# Patient Record
Sex: Female | Born: 1970 | Race: Black or African American | Hispanic: No | Marital: Married | Smoking: Never smoker
Health system: Southern US, Community
[De-identification: ages and names within clinical notes are randomized; demographics above are authoritative.]

## PROBLEM LIST (undated history)

## (undated) DIAGNOSIS — C50411 Malignant neoplasm of upper-outer quadrant of right female breast: Principal | ICD-10-CM

## (undated) DIAGNOSIS — R519 Headache, unspecified: Secondary | ICD-10-CM

## (undated) DIAGNOSIS — R51 Headache: Secondary | ICD-10-CM

## (undated) DIAGNOSIS — C50919 Malignant neoplasm of unspecified site of unspecified female breast: Secondary | ICD-10-CM

## (undated) HISTORY — PX: DILATION AND CURETTAGE OF UTERUS: SHX78

## (undated) HISTORY — DX: Malignant neoplasm of upper-outer quadrant of right female breast: C50.411

## (undated) HISTORY — DX: Malignant neoplasm of unspecified site of unspecified female breast: C50.919

---

## 2002-01-29 ENCOUNTER — Other Ambulatory Visit: Admission: RE | Admit: 2002-01-29 | Discharge: 2002-01-29 | Payer: Self-pay | Admitting: Internal Medicine

## 2002-07-01 ENCOUNTER — Emergency Department (HOSPITAL_COMMUNITY): Admission: EM | Admit: 2002-07-01 | Discharge: 2002-07-02 | Payer: Self-pay | Admitting: Emergency Medicine

## 2003-05-10 ENCOUNTER — Encounter (INDEPENDENT_AMBULATORY_CARE_PROVIDER_SITE_OTHER): Payer: Self-pay

## 2003-05-10 ENCOUNTER — Ambulatory Visit (HOSPITAL_COMMUNITY): Admission: RE | Admit: 2003-05-10 | Discharge: 2003-05-10 | Payer: Self-pay | Admitting: Obstetrics and Gynecology

## 2003-11-21 ENCOUNTER — Other Ambulatory Visit: Admission: RE | Admit: 2003-11-21 | Discharge: 2003-11-21 | Payer: Self-pay | Admitting: Obstetrics and Gynecology

## 2005-07-26 ENCOUNTER — Inpatient Hospital Stay (HOSPITAL_COMMUNITY): Admission: AD | Admit: 2005-07-26 | Discharge: 2005-07-26 | Payer: Self-pay | Admitting: Obstetrics and Gynecology

## 2005-08-31 ENCOUNTER — Encounter (INDEPENDENT_AMBULATORY_CARE_PROVIDER_SITE_OTHER): Payer: Self-pay | Admitting: Specialist

## 2005-08-31 ENCOUNTER — Inpatient Hospital Stay (HOSPITAL_COMMUNITY): Admission: AD | Admit: 2005-08-31 | Discharge: 2005-09-02 | Payer: Self-pay | Admitting: *Deleted

## 2007-11-15 ENCOUNTER — Inpatient Hospital Stay (HOSPITAL_COMMUNITY): Admission: RE | Admit: 2007-11-15 | Discharge: 2007-11-18 | Payer: Self-pay | Admitting: Obstetrics and Gynecology

## 2011-04-20 NOTE — Op Note (Signed)
Kiara Spencer, Kiara Spencer             ACCOUNT NO.:  [id number]   MEDICAL RECORD NO.:  [id number]          PATIENT TYPE:  INP   LOCATION:  9199                          [facility]ILITY:  [facility]   PHYSICIAN:  Lenoard Aden, M.D.DATE OF BIRTH:  October 14, 1971   DATE OF PROCEDURE:  11/15/2007  DATE OF DISCHARGE:                               OPERATIVE REPORT   PREOPERATIVE DIAGNOSIS:  40-week intrauterine pregnancy, previous  cesarean section, repeat.   POSTOPERATIVE DIAGNOSIS:  40-week intrauterine pregnancy, previous  cesarean section, repeat.   PROCEDURE:  Repeat low transverse cesarean section.   SURGEON:  Lenoard Aden, M.D.   ASSISTANT:  Marlinda Mike, C.N.M.   ANESTHESIA:  Spinal by Dr. Jean Rosenthal.   ESTIMATED BLOOD LOSS:  1000 mL.   COMPLICATIONS:  None.   DRAINS:  Foley.   COUNTS:  Correct.   DISPOSITION:  The patient to recovery room in good condition.   FINDINGS:  Full term living female in occipitoanterior position.  Apgars  8 and 9.  Placenta manually intact from a posterior location, three  vessel cord.  Normal tubes, normal ovaries.   DESCRIPTION OF PROCEDURE:  After being apprised of risks of anesthesia,  infection, bleeding, injury to abdominal organs with need for repair,  delayed versus immediate complications to include bowel and bladder  injury, the patient was brought to the operating room where she was  administered spinal anesthetic without complications and prepped and  draped in the usual sterile fashion.  Foley catheter placed.  After  achieving adequate anesthesia, dilute Marcaine solution placed.  A  Pfannenstiel skin incision was made with the scalpel and carried down to  the fascia which was nicked in the midline and opened transversely using  Mayo scissors.  The rectus muscle were separated sharply in the midline.  The peritoneum entered sharply and bladder blade placed.  The  visceroperitoneum was scored sharply off the lower uterine segment and  Kerr  hysterotomy incision made.  Bandage scissors used to extend  laterally.  Atraumatic delivery after amniotomy of clear fluid, full  term living female to the pediatricians.  Apgars 8 and 9.  Cord blood  collected.  Placenta delivered manually intact with three vessel cord.  Uterus curetted using a dry laparoscopy pack and closed in two running  imbricating layers of 0 Monocryl suture.  Good hemostasis  noted.  Irrigation accomplished.  Fascia was closed using a 0 Monocryl  in continuous running fashion.  The skin was closed using staples.  The  patient tolerated this procedure well and was transferred to the  recovery room in good condition.      Lenoard Aden, M.D.  Electronically Signed     RJT/MEDQ  D:  11/15/2007  T:  11/15/2007  Job:  [id number]

## 2011-04-23 NOTE — H&P (Signed)
   NAME:  Kiara Spencer, Kiara Spencer                       ACCOUNT NO.:  [id number]   MEDICAL RECORD NO.:  [id number]                   PATIENT TYPE:  AMB   LOCATION:  SDC                                  [facility]   PHYSICIAN:  Lenoard Aden, M.D.             DATE OF BIRTH:  01/18/1971   DATE OF ADMISSION:  05/10/2003  DATE OF DISCHARGE:                                HISTORY & PHYSICAL   CHIEF COMPLAINT:  Desire for elective termination.   HISTORY OF PRESENT ILLNESS:  The patient is a 40 year old African American  female, G4, P0, at 9-10 weeks intrauterine gestation with a history of SAB  x3, who presents for elective termination of pregnancy.   MEDICATIONS:  Excedrin.   ALLERGIES:  No known drug allergies.   FAMILY HISTORY:  Hypertension.   SOCIAL HISTORY:  Nonsmoker, resides with mom.   PHYSICAL EXAMINATION:  GENERAL:  She is a well-developed, well-nourished  [place] female in no acute distress.  HEENT:  Normal.  LUNGS:  Clear.  HEART:  Regular rhythm.  ABDOMEN:  Sharply retroflexed, enlarged 8-10 weeks size with small posterior  fundal fibroid noted on ultrasound of 4 x 4 cm.  EXTREMITIES:  No cyanosis, clubbing, or edema.  NEUROLOGICAL:  Nonfocal.   IMPRESSION:  A 9-10 week intrauterine pregnancy with desire for termination.   PLAN:  Proceed with suction D&E.  Risks of anesthesia, infection, bleeding,  injury to abdominal organs, need for pressors, complications including bowel  and bladder rupture noted.  The patient acknowledges and wishes to proceed.                                               Lenoard Aden, M.D.    RJT/MEDQ  D:  05/08/2003  T:  05/08/2003  Job:  [id number]

## 2011-04-23 NOTE — Discharge Summary (Signed)
Kiara Spencer, Kiara Spencer             ACCOUNT NO.:  [id number]   MEDICAL RECORD NO.:  [id number]          PATIENT TYPE:  INP   LOCATION:  9108                          [facility]   PHYSICIAN:  Lenoard Aden, M.D.DATE OF BIRTH:  May 16, 1971   DATE OF ADMISSION:  11/15/2007  DATE OF DISCHARGE:  11/18/2007                               DISCHARGE SUMMARY   HISTORY OF PRESENT ILLNESS:  The patient underwent uncomplicated repeat  Cesarean section November 17, 2007. Postoperative course uncomplicated.  Tolerated a regular diet well.   DISPOSITION:  Discharged to home on day 3. Discharge teaching done.   DISCHARGE MEDICATIONS:  1. Tylox.  2. Prenatal vitamins.  3. Iron.   FOLLOWUP:  In the office in 4 to 6 weeks.      Lenoard Aden, M.D.  Electronically Signed     RJT/MEDQ  D:  01/02/2008  T:  01/02/2008  Job:  [id number]

## 2011-04-23 NOTE — Op Note (Signed)
[name], Kiara Spencer             ACCOUNT NO.:  [id number]   MEDICAL RECORD NO.:  [id number]          PATIENT TYPE:  INP   LOCATION:  9103                          [facility]   PHYSICIAN:  Lenoard Aden, M.D.DATE OF BIRTH:  July 29, 1971   DATE OF PROCEDURE:  08/31/2005  DATE OF DISCHARGE:                                 OPERATIVE REPORT   PREOPERATIVE DIAGNOSES:  1.  Active phase arrest.  2.  Nonreassuring fetal heart rate.   POSTOPERATIVE DIAGNOSES:  1.  Active phase arrest.  2.  Nonreassuring fetal heart rate.   OPERATION/PROCEDURE:  Primary low segment transverse cesarean section.   SURGEON:  Lenoard Aden, M.D.   ASSISTANT:  Richardean Sale, M.D.   ANESTHESIA:  Epidural by Burnett Corrente, M.D.   ESTIMATED BLOOD LOSS:  1000 mL.   COMPLICATIONS:  None.   DRAINS:  Foley catheter.   COUNTS:  Correct.   DISPOSITION:  The patient to recovery in good condition.   FINDINGS:  Full-term living female, occiput transverse,  Apgars 8 and 9.  Cord blood collected.  Pediatricians in attendance.  Normal tubes.  Normal  ovaries.   DESCRIPTION OF PROCEDURE:  After being apprised of the risks of anesthesia,  infection, bleeding, injury to abdominal organs and need for repair, delayed  versus immediate complications to include bowel and bladder injury noted.  The patient brought to the operating room.  She was administered a dosing of  her epidural anesthetic without complications, prepped and draped in the  usual sterile fashion.  Foley catheter previously placed.  After achieving  adequate anesthesia, dilute Marcaine solution placed in the area.  Pfannenstiel skin incision made with the scalpel, carried down to the fascia  which was nicked in the midline and opened transversely using Mayo scissors.  Rectus muscles were dissected sharply in the midline.  Peritoneum entered  sharply and bladder blade placed.  Visceral peritoneum was scored sharply  off the lower uterine  segment.  Curved hysterotomy incision made.  Atraumatic delivery of a full-term living female, handed to the  pediatricians in attendance.  Apgars 8 and 9, cord blood collected.  Placenta delivered manually intact.  Three-vessel cord noted.  Uterus  curetted using dry lap pads and closed in two layers using 0 Monocryl  suture.  Good hemostasis noted.  Right lateral margin bleeding was secured  with an interrupted figure-of-eight suture.  Bladder flap was inspected and  found to be hemostatic. At this time irrigation was accomplished. The fascia  was then closed using 0 Monocryl on tension.  Skin closed using staples.  Ovaries and tubes were previously palpably normal.  The patient tolerated  the procedure well and was transferred to the recovery room in good  condition.      Lenoard Aden, M.D.  Electronically Signed     RJT/MEDQ  D:  08/31/2005  T:  09/01/2005  Job:  [id number]

## 2011-04-23 NOTE — Discharge Summary (Signed)
NAMEJATZIRY, WECHTER             ACCOUNT NO.:  [id number]   MEDICAL RECORD NO.:  [id number]          PATIENT TYPE:  INP   LOCATION:  9103                          [facility]   PHYSICIAN:  Lenoard Aden, M.D.DATE OF BIRTH:  Jun 16, 1971   DATE OF ADMISSION:  08/31/2005  DATE OF DISCHARGE:  09/02/2005                                 DISCHARGE SUMMARY   The patient underwent uncomplicated primary cesarean section for active  phase arrest on August 31, 2005. Postoperative course was uncomplicated.  Discharged to home on day #3. Tylox and prenatal vitamins.  Iron given.  Discharge teaching done.  The patient to follow up in the office in four  weeks.      Lenoard Aden, M.D.  Electronically Signed     RJT/MEDQ  D:  10/03/2005  T:  10/04/2005  Job:  [id number]

## 2011-04-23 NOTE — Op Note (Signed)
   NAME:  Kiara Spencer, Kiara Spencer                       ACCOUNT NO.:  [id number]   MEDICAL RECORD NO.:  [id number]                   PATIENT TYPE:  AMB   LOCATION:  SDC                                  [facility]   PHYSICIAN:  Lenoard Aden, M.D.             DATE OF BIRTH:  08-20-1971   DATE OF PROCEDURE:  05/10/2003  DATE OF DISCHARGE:                                 OPERATIVE REPORT   PREOPERATIVE DIAGNOSES:  1. Ten-week intrauterine pregnancy.  2. Elective termination of pregnancy.   POSTOPERATIVE DIAGNOSES:  1. Ten-week intrauterine pregnancy.  2. Elective termination of pregnancy.   PROCEDURE:  Suction dilatation and evacuation.   SURGEON:  Lenoard Aden, M.D.   ANESTHESIA:  MAC with paracervical.   ESTIMATED BLOOD LOSS:  Less than 100 mL.   COMPLICATIONS:  None.   DRAINS:  None.   COUNTS:  Correct.   Patient went to the recovery room in good condition.  Products of conception  to pathology.   BRIEF OPERATIVE NOTE:  After being apprised of the risks of anesthesia,  infection, bleeding, uterine perforation and possible need for repair, the  patient was brought to the operating room, where she was administered IV  sedation without difficulty, prepped and draped in the usual sterile  fashion, catheterized until the bladder is empty.  After achieving adequate  anesthesia, a dilute paracervical block is placed using a 1% Xylocaine  solution.  Uterus sounds in a retroflexed fashion and deviated to the left,  sounds to 12 cm, dilated easily up to a #27 Pratt dilator.  A 9 mm curved  suction curette placed, products of conception noted upon initiating  suction.  Repeat suction and curettage in four-quadrant method reveals the  cavity to be empty.  Good hemostasis was noted.  All instruments were  removed from the vagina.  The patient was transferred to recovery in good  condition.                                                Lenoard Aden, M.D.    RJT/MEDQ  D:  05/10/2003  T:  05/10/2003  Job:  [id number]

## 2011-04-23 NOTE — H&P (Signed)
NAMEMARYRUTH, APPLE             ACCOUNT NO.:  [id number]   MEDICAL RECORD NO.:  [id number]          PATIENT TYPE:  INP   LOCATION:  9166                          [facility]   PHYSICIAN:  Lenoard Aden, M.D.DATE OF BIRTH:  29-Jun-1971   DATE OF ADMISSION:  08/31/2005  DATE OF DISCHARGE:                                HISTORY & PHYSICAL   CHIEF COMPLAINT:  Spontaneous rupture of membranes at 1:45 a.m.   HISTORY OF PRESENT ILLNESS:  She is a 40 year old G5, P0, EDD of September 07, 2005, at 1 weeks' gestation, who presents with spontaneous rupture of  membranes and clear fluid.   ALLERGIES:  She has no known drug allergies.   MEDICATIONS:  Prenatal vitamins.   OBSTETRIC HISTORY:  Remarkable for TAB x4 with the most recent being in  2005. She has a questionable history of Chlamydia in the 1990s, gonorrhea in  1989.   SOCIAL HISTORY:  She is a nonsmoker, nondrinker. She denies domestic or  physical violence.   FAMILY HISTORY:  She has a family history of hypertension, hypothyroidism,  breast cancer and alcohol abuse.   PAST MEDICAL HISTORY:  She has a personal history of migraine headaches.   PRENATAL LABORATORY DATA:  Blood type B positive, Rh antibody negative.  Hemoglobin electrophoresis within normal limits. Rubella immune. VDRL  nonreactive. Hepatitis and HIV nonreactive, and GBS as reported is positive.  The pregnancy course has been otherwise uncomplicated.   PHYSICAL EXAMINATION:  GENERAL:  She is a well-developed, well-nourished  [place] female in no acute distress.  HEENT:  Normal.  LUNGS:  Clear.  HEART:  Regular rate and rhythm.  ABDOMEN:  Soft, gravid and nontender. Estimated fetal weight about 7 pounds.  GENITALIA:  The cervix is 4 cm, 90%, vertex -1.  EXTREMITIES:  Reveal no cords.  NEUROLOGIC EXAM:  Nonfocal.   IMPRESSION:  1.  A 39-week intrauterine pregnancy.  2.  Spontaneous rupture of membranes at term.  3.  Group B Streptococcus  positive.   PLAN:  Proceed with Pitocin augmentation. Anticipate attempts at vaginal  delivery.      Lenoard Aden, M.D.  Electronically Signed     RJT/MEDQ  D:  08/31/2005  T:  08/31/2005  Job:  [id number]   cc:   [facility]

## 2011-05-18 ENCOUNTER — Other Ambulatory Visit: Payer: Self-pay | Admitting: Obstetrics and Gynecology

## 2011-09-13 LAB — RPR: RPR Ser Ql: NONREACTIVE

## 2011-09-13 LAB — CBC
Hemoglobin: 12.4
MCHC: 34.1
MCHC: 34.2
RBC: 3.35 — ABNORMAL LOW
RDW: 13.8
RDW: 13.8
WBC: 10
WBC: 7

## 2015-02-11 ENCOUNTER — Other Ambulatory Visit (HOSPITAL_COMMUNITY)
Admission: RE | Admit: 2015-02-11 | Discharge: 2015-02-11 | Disposition: A | Discharge status: Home / Self Care | Payer: BLUE CROSS/BLUE SHIELD | Source: Ambulatory Visit | Attending: Family Medicine | Admitting: Family Medicine

## 2015-02-11 ENCOUNTER — Other Ambulatory Visit: Payer: Self-pay | Admitting: Physician Assistant

## 2015-02-11 DIAGNOSIS — Z124 Encounter for screening for malignant neoplasm of cervix: Secondary | ICD-10-CM | POA: Insufficient documentation

## 2015-02-13 LAB — CYTOLOGY - PAP

## 2015-11-13 ENCOUNTER — Other Ambulatory Visit: Payer: Self-pay | Admitting: Obstetrics and Gynecology

## 2015-11-13 DIAGNOSIS — N63 Unspecified lump in unspecified breast: Secondary | ICD-10-CM

## 2015-11-13 DIAGNOSIS — N644 Mastodynia: Secondary | ICD-10-CM

## 2015-11-24 ENCOUNTER — Ambulatory Visit
Admission: RE | Admit: 2015-11-24 | Discharge: 2015-11-24 | Disposition: A | Discharge status: Home / Self Care | Payer: BLUE CROSS/BLUE SHIELD | Source: Ambulatory Visit | Attending: Obstetrics and Gynecology | Admitting: Obstetrics and Gynecology

## 2015-11-24 ENCOUNTER — Other Ambulatory Visit: Payer: Self-pay | Admitting: Obstetrics and Gynecology

## 2015-11-24 DIAGNOSIS — N644 Mastodynia: Secondary | ICD-10-CM

## 2015-11-24 DIAGNOSIS — N63 Unspecified lump in unspecified breast: Secondary | ICD-10-CM

## 2015-11-27 ENCOUNTER — Other Ambulatory Visit: Payer: Self-pay | Admitting: Obstetrics and Gynecology

## 2015-11-27 ENCOUNTER — Ambulatory Visit
Admission: RE | Admit: 2015-11-27 | Discharge: 2015-11-27 | Disposition: A | Discharge status: Home / Self Care | Payer: BLUE CROSS/BLUE SHIELD | Source: Ambulatory Visit | Attending: Obstetrics and Gynecology | Admitting: Obstetrics and Gynecology

## 2015-11-27 DIAGNOSIS — N63 Unspecified lump in unspecified breast: Secondary | ICD-10-CM

## 2015-11-27 DIAGNOSIS — N644 Mastodynia: Secondary | ICD-10-CM

## 2015-12-02 ENCOUNTER — Encounter: Payer: Self-pay | Admitting: *Deleted

## 2015-12-02 ENCOUNTER — Telehealth: Payer: Self-pay | Admitting: *Deleted

## 2015-12-02 DIAGNOSIS — C50411 Malignant neoplasm of upper-outer quadrant of right female breast: Secondary | ICD-10-CM | POA: Insufficient documentation

## 2015-12-02 HISTORY — DX: Malignant neoplasm of upper-outer quadrant of right female breast: C50.411

## 2015-12-02 NOTE — Telephone Encounter (Signed)
Confirmed [facility] for 12/10/15 at 0830 .  Instructions and contact information given.

## 2015-12-03 ENCOUNTER — Telehealth: Payer: Self-pay | Admitting: *Deleted

## 2015-12-03 NOTE — Telephone Encounter (Signed)
Mailed clinic packet to pt.  

## 2015-12-10 ENCOUNTER — Ambulatory Visit (HOSPITAL_BASED_OUTPATIENT_CLINIC_OR_DEPARTMENT_OTHER): Payer: BLUE CROSS/BLUE SHIELD | Admitting: Hematology and Oncology

## 2015-12-10 ENCOUNTER — Encounter: Payer: Self-pay | Admitting: Hematology and Oncology

## 2015-12-10 ENCOUNTER — Ambulatory Visit: Payer: BLUE CROSS/BLUE SHIELD | Attending: General Surgery | Admitting: Physical Therapy

## 2015-12-10 ENCOUNTER — Encounter: Payer: Self-pay | Admitting: Physical Therapy

## 2015-12-10 ENCOUNTER — Encounter: Payer: Self-pay | Admitting: Skilled Nursing Facility1

## 2015-12-10 ENCOUNTER — Ambulatory Visit
Admission: RE | Admit: 2015-12-10 | Discharge: 2015-12-10 | Disposition: A | Discharge status: Home / Self Care | Payer: BLUE CROSS/BLUE SHIELD | Source: Ambulatory Visit | Attending: Radiation Oncology | Admitting: Radiation Oncology

## 2015-12-10 ENCOUNTER — Other Ambulatory Visit (HOSPITAL_BASED_OUTPATIENT_CLINIC_OR_DEPARTMENT_OTHER): Payer: BLUE CROSS/BLUE SHIELD

## 2015-12-10 ENCOUNTER — Encounter: Payer: Self-pay | Admitting: Nurse Practitioner

## 2015-12-10 ENCOUNTER — Encounter: Payer: Self-pay | Admitting: *Deleted

## 2015-12-10 VITALS — BP 122/69 | HR 82 | Temp 98.6°F | Resp 18 | Ht 61.5 in | Wt 152.3 lb

## 2015-12-10 DIAGNOSIS — C50411 Malignant neoplasm of upper-outer quadrant of right female breast: Secondary | ICD-10-CM | POA: Insufficient documentation

## 2015-12-10 LAB — COMPREHENSIVE METABOLIC PANEL
ALBUMIN: 4.1 g/dL (ref 3.5–5.0)
ALT: 11 U/L (ref 0–55)
AST: 18 U/L (ref 5–34)
Alkaline Phosphatase: 58 U/L (ref 40–150)
Anion Gap: 8 mEq/L (ref 3–11)
BILIRUBIN TOTAL: 0.45 mg/dL (ref 0.20–1.20)
BUN: 19.2 mg/dL (ref 7.0–26.0)
CALCIUM: 9.5 mg/dL (ref 8.4–10.4)
CO2: 27 mEq/L (ref 22–29)
Chloride: 106 mEq/L (ref 98–109)
Creatinine: 0.9 mg/dL (ref 0.6–1.1)
EGFR: 85 mL/min/{1.73_m2} — AB (ref >90)
GLUCOSE: 122 mg/dL (ref 70–140)
Potassium: 3.7 mEq/L (ref 3.5–5.1)
SODIUM: 142 meq/L (ref 136–145)
TOTAL PROTEIN: 7.5 g/dL (ref 6.4–8.3)

## 2015-12-10 LAB — CBC WITH DIFFERENTIAL/PLATELET
BASO%: 0.7 % (ref 0.0–2.0)
Basophils Absolute: 0 10*3/uL (ref 0.0–0.1)
EOS ABS: 0.1 10*3/uL (ref 0.0–0.5)
EOS%: 1.6 % (ref 0.0–7.0)
HEMATOCRIT: 40.1 % (ref 34.8–46.6)
HEMOGLOBIN: 13 g/dL (ref 11.6–15.9)
LYMPH%: 34.1 % (ref 14.0–49.7)
MCH: 28.8 pg (ref 25.1–34.0)
MCHC: 32.5 g/dL (ref 31.5–36.0)
MCV: 88.8 fL (ref 79.5–101.0)
MONO#: 0.2 10*3/uL (ref 0.1–0.9)
MONO%: 5.4 % (ref 0.0–14.0)
NEUT%: 58.2 % (ref 38.4–76.8)
NEUTROS ABS: 2.4 10*3/uL (ref 1.5–6.5)
PLATELETS: 257 10*3/uL (ref 145–400)
RBC: 4.52 10*6/uL (ref 3.70–5.45)
RDW: 13 % (ref 11.2–14.5)
WBC: 4.2 10*3/uL (ref 3.9–10.3)
lymph#: 1.4 10*3/uL (ref 0.9–3.3)

## 2015-12-10 NOTE — Assessment & Plan Note (Signed)
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 2 suspicious groups of calcifications right breast UOQ posteriorly 2.2 x 2.3 x 2.5 cm (by U/S measured 1.2 x 0.8 x 1.2 cm); anteriorly 1.1 x 1.2 x 0.8 cm (not seen by ultrasound)  Pathology and radiology counseling: Discussed with the patient, the details of pathology including the type of breast cancer,the clinical staging, the significance of ER, PR and HER-2/neu receptors and the implications for treatment. After reviewing the pathology in detail, we proceeded to discuss the different treatment options between surgery, radiation, chemotherapy, antiestrogen therapies.  Recommendation: 1. Genetic counseling 2. Breast MRI 3. Breast conserving surgery versus mastectomy based upon breast MRI results 4. Oncotype DX and the final pathology specimen to determine if she would benefit from chemotherapy 5. Followed by adjuvant radiation therapy if she has breast conserving surgery 6. Followed by adjuvant antiestrogen therapy with tamoxifen 5-10 years  Oncotype counseling: I discussed Oncotype DX test. I explained to the patient that this is a 21 gene panel to evaluate patient tumors DNA to calculate recurrence score. This would help determine whether patient has high risk or intermediate risk or low risk breast cancer. She understands that if her tumor was found to be high risk, she would benefit from systemic chemotherapy. If low risk, no need of chemotherapy. If she was found to be intermediate risk, we would need to evaluate the score as well as other risk factors and determine if an abbreviated chemotherapy may be of benefit.  Return to clinic after surgery to discuss final pathology report and to determine if Oncotype DX should be sent

## 2015-12-10 NOTE — Progress Notes (Signed)
Clinical Social Work [place] Psychosocial Distress Screening [place]  Patient completed distress screening protocol and scored a 5 on the Psychosocial Distress Thermometer which indicates moderate distress. Clinical Social Worker met with patient and patients husband in [facility] to assess for distress and other psychosocial needs. Patient stated she was feeling overwhelmed but felt "better" after meeting with the treatment team and getting more information on her treatment plan. CSW and patient discussed common feeling and emotions when being diagnosed with cancer, and the importance of support during treatment. CSW informed patient of the support team and support services at [facility], and patient was agreeable to an alight guide. CSW provided contact information and encouraged patient to call with any questions or concerns.   ONCBCN DISTRESS SCREENING 12/10/2015  Screening Type Initial Screening  Distress experienced in past week (1-10) 5  Practical problem type Childcare  Emotional problem type Nervousness/Anxiety;Adjusting to illness  Information Concerns Type Lack of info about diagnosis;Lack of info about treatment  Physical Problem type Pain;Sleep/insomnia  Physician notified of physical symptoms Yes  Referral to clinical psychology No  Referral to clinical social work Yes  Referral to dietition No  Referral to financial advocate No  Referral to support programs Yes  Referral to palliative care No   Johnnye Lana, MSW, LCSW, OSW-C Clinical Social Worker [place] [phone]

## 2015-12-10 NOTE — Patient Instructions (Signed)

## 2015-12-10 NOTE — Progress Notes (Signed)
Radiation Oncology         [phone] ________________________________  Name: Kiara Spencer MRN: [id number]  Date: 12/10/2015  DOB: [birth year]  DE:YCXKGY,JEHUDJS J, MD  [name], MD     REFERRING PHYSICIAN: [name], MD   DIAGNOSIS: The encounter diagnosis was Breast cancer of upper-outer quadrant of right female breast ([place]).   HISTORY OF PRESENT ILLNESS::Kiara Spencer is a 45 y.o. female who is seen for an initial consultation visit. She presented with right axillary tail breast pain. Mammogram on 11/24/15 showed calcifications in the upper outer quadrant of the right breast, posterior depth, measuring 2.2 x 2.3 x 2.5 cm. A second group of calcifications were seen in the upper outer quadrant of the right breast, middle to posterior depth, measuring 1.1 x 1.2 x 0.8 cm. Ultrasound on 11/24/15 showed an ill-defined hypoechoic area containing hyperechoic foci consistent with microcalcifications measuring 1.2 x 0.8 x 1.2 cm in the right breast likely corresponding to the larger and more suspicious microcalcifications seen on mammography. No sonographic correlation was seen to the smaller more anteriorly located group of calcifications. This was BI-RADS CATEGORY 5: Highly suggestive of malignancy. 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 Excision of the sclerosing lesion was recommended and so was an MRI of the bilateral breasts prior to excision given the multifocal abnormalities to evaluate the extent of the disease. The anterior biopsy clip migrated 2 cm away from the sample site. The patient presents today in [facility].   PREVIOUS RADIATION THERAPY:  No   PAST MEDICAL HISTORY:  has a past medical history of Breast cancer of upper-outer quadrant of right female breast ([place]) (12/02/2015) and Breast cancer ([place]).     PAST SURGICAL HISTORY: Past Surgical History  Procedure Laterality Date  . Cesarean section  x 2     FAMILY HISTORY: family history includes Breast cancer in her maternal aunt and mother; Leukemia in her maternal uncle.   SOCIAL HISTORY:  reports that she has never smoked. She does not have any smokeless tobacco history on file. She reports that she does not drink alcohol or use illicit drugs.   ALLERGIES: Review of patient's allergies indicates not on file.   MEDICATIONS:  Current Outpatient Prescriptions  Medication Sig Dispense Refill  . aspirin-acetaminophen-caffeine (EXCEDRIN MIGRAINE) 250-250-65 MG tablet Take by mouth every 6 (six) hours as needed for headache.     No current facility-administered medications for this encounter.     REVIEW OF SYSTEMS:  A 15 point review of systems is documented in the electronic medical record. This was obtained by the nursing staff. However, I reviewed this with the patient to discuss relevant findings and make appropriate changes.  Pertinent items noted in HPI and remainder of comprehensive ROS otherwise negative.  Gynecologic History  Age at first menstrual period? 14  Are you still having periods? No  If you no longer have periods: Have you used hormone replacement? No Obstetric History:  How many children have you carried to term? 2 Your age at first live birth? 22  Pregnant now or trying to get pregnant? No  Have you used birth control pills or hormone shots for contraception? Yes  If so, for how long (or  approximate dates)? 17 years  Would you be interested in learning more about the options to preserve fertility? No Health Maintenance:  Have you ever had a colonoscopy? No  Have you ever had a bone density? No  Date of your last PAP smear? 2013 Date of your  FIRST mammogram? 2015  She has right axillary tail breast pain.   PHYSICAL EXAM: She had a palpable mass in the uoq of the right breast superiorly. No palpable axillary lymphadenopathy. Negative breast exam of the left. General: Well-developed, in no acute distress HEENT: Normocephalic, atraumatic; oral cavity clear Neck: Supple without any lymphadenopathy Cardiovascular: Regular rate and rhythm Respiratory: Clear to auscultation bilaterally GI: Soft, nontender, normal bowel sounds Extremities: No edema present Neuro: No focal deficits Vitals with BMI 12/10/2015  Height 5' 1.5"  Weight 152 lbs 5 oz  BMI 17.4  Systolic 944  Diastolic 69  Pulse 82  Respirations 18   ECOG = 1  0 - Asymptomatic (Fully active, able to carry on all predisease activities without restriction)  1 - Symptomatic but completely ambulatory (Restricted in physically strenuous activity but ambulatory and able to carry out work of a light or sedentary nature. For example, light housework, office work)  2 - Symptomatic, <50% in bed during the day (Ambulatory and capable of all self care but unable to carry out any work activities. Up and about more than 50% of waking hours)  3 - Symptomatic, >50% in bed, but not bedbound (Capable of only limited self-care, confined to bed or chair 50% or more of waking hours)  4 - Bedbound (Completely disabled. Cannot carry on any self-care. Totally confined to bed or chair)  5 - Death   Eustace Pen MM, Creech RH, Tormey DC, et al. [phone]). "Toxicity and response criteria of the [facility]". [place] Oncol. 5 (6): 649-55   LABORATORY DATA:  Lab Results  Component Value Date   WBC 4.2 12/10/2015   HGB 13.0 12/10/2015   HCT 40.1 12/10/2015   MCV 88.8 12/10/2015   PLT 257 12/10/2015   Lab Results  Component Value Date   NA 142 12/10/2015   K 3.7 12/10/2015   CO2 27 12/10/2015   Lab Results  Component Value Date   ALT 11 12/10/2015   AST 18  12/10/2015   ALKPHOS 58 12/10/2015   BILITOT 0.45 12/10/2015      RADIOGRAPHY: Mm Digital Diagnostic Unilat R  11/27/2015  CLINICAL DATA:  Post biopsy mammogram of the right breast for clip placement. EXAM: DIAGNOSTIC RIGHT MAMMOGRAM POST ULTRASOUND BIOPSY COMPARISON:  Previous exam(s). FINDINGS: Mammographic images were obtained following stereotactic guided biopsy of 2 sites of calcifications in the right breast. The coil shaped biopsy marking clip is appropriately positioned at the first site of biopsy in the upper-outer quadrant of the right breast labeled "posterior" for pathology. The X shaped biopsy marking clip at the second biopsied site in the upper-outer quadrant of the right breast labeled "anterior" for pathology is approximately 2 cm medially displaced from the sampled site. There are a few remaining calcifications at this site if localization for excision is necessary. IMPRESSION: 1. The coil shaped biopsy marking clip is appropriately positioned at the first site of biopsy in the upper-outer quadrant of the right breast, labeled "posterior" for pathology. 2. The X shaped biopsy marking clip is approximately 2 cm a medially displaced from the sampled site in the upper-outer quadrant of the right breast labeled "anterior" for pathology. There are residual  calcifications should localization for excision be necessary. Final Assessment: Post Procedure Mammograms for Marker Placement Electronically Signed   By: Ammie Ferrier M.D.   On: 11/27/2015 11:22   US Breast Ltd Uni Right Inc Axilla  11/24/2015  CLINICAL DATA:  Right axillary tail breast pain. EXAM: DIGITAL DIAGNOSTIC BILATERAL MAMMOGRAM WITH 3D TOMOSYNTHESIS WITH CAD ULTRASOUND RIGHT BREAST COMPARISON:  Previous exam(s). ACR Breast Density Category c: The breast tissue is heterogeneously dense, which may obscure small masses. FINDINGS: Mammographically, there is a highly suspicious group of calcifications in the right breast upper  outer quadrant, far posterior depth, measuring 2.2 x 2.3 x 2.5 cm. There is an associated focal asymmetry. A second group of indeterminate calcifications is seen in the right breast upper outer quadrant, middle to posterior depth, measuring 1.1 x 1.2 x 0.8 cm. Mammographic images were processed with CAD. On physical exam, there is a firm palpable mass in the right breast upper outer quadrant, posterior depth. Targeted ultrasound is performed, showing an ill-defined hypoechoic area containing hyperechoic foci consistent with interspersed microcalcifications which measures 1.2 x 0.8 x 1.2 cm in the right breast 10 o'clock 10 cm from the nipple. This area likely corresponds to the larger and more suspicious group of microcalcifications seen mammographically. No sonographic correlation is seen to the smaller more anteriorly located group of calcifications in the upper-outer quadrant of the right breast. IMPRESSION: Two suspicious groups of microcalcifications in the right breast upper outer quadrant, for which stereotactic core needle biopsy is recommended. If stereotactic biopsy of the more posterior group is not technically feasible, ultrasound-guided core needle biopsy may be performed instead. RECOMMENDATION: Stereotactic biopsy of the right breast. I have discussed the findings and recommendations with the patient. Results were also provided in writing at the conclusion of the visit. If applicable, a reminder letter will be sent to the patient regarding the next appointment. BI-RADS CATEGORY  5: Highly suggestive of malignancy. Electronically Signed   By: Fidela Salisbury M.D.   On: 11/24/2015 17:32   Mm Diag Breast Tomo Bilateral  11/24/2015  CLINICAL DATA:  Right axillary tail breast pain. EXAM: DIGITAL DIAGNOSTIC BILATERAL MAMMOGRAM WITH 3D TOMOSYNTHESIS WITH CAD ULTRASOUND RIGHT BREAST COMPARISON:  Previous exam(s). ACR Breast Density Category c: The breast tissue is heterogeneously dense, which may  obscure small masses. FINDINGS: Mammographically, there is a highly suspicious group of calcifications in the right breast upper outer quadrant, far posterior depth, measuring 2.2 x 2.3 x 2.5 cm. There is an associated focal asymmetry. A second group of indeterminate calcifications is seen in the right breast upper outer quadrant, middle to posterior depth, measuring 1.1 x 1.2 x 0.8 cm. Mammographic images were processed with CAD. On physical exam, there is a firm palpable mass in the right breast upper outer quadrant, posterior depth. Targeted ultrasound is performed, showing an ill-defined hypoechoic area containing hyperechoic foci consistent with interspersed microcalcifications which measures 1.2 x 0.8 x 1.2 cm in the right breast 10 o'clock 10 cm from the nipple. This area likely corresponds to the larger and more suspicious group of microcalcifications seen mammographically. No sonographic correlation is seen to the smaller more anteriorly located group of calcifications in the upper-outer quadrant of the right breast. IMPRESSION: Two suspicious groups of microcalcifications in the right breast upper outer quadrant, for which stereotactic core needle biopsy is recommended. If stereotactic biopsy of the more posterior group is not technically feasible, ultrasound-guided core needle biopsy may be performed instead. RECOMMENDATION: Stereotactic biopsy of the right breast.  I have discussed the findings and recommendations with the patient. Results were also provided in writing at the conclusion of the visit. If applicable, a reminder letter will be sent to the patient regarding the next appointment. BI-RADS CATEGORY  5: Highly suggestive of malignancy. Electronically Signed   By: Fidela Salisbury M.D.   On: 11/24/2015 17:32   Mm Rt Breast Bx W Loc Dev 1st Lesion Image Bx Spec Stereo Guide  11/28/2015  ADDENDUM REPORT: 11/28/2015 12:40 ADDENDUM: Pathology revealed grade II invasive ductal carcinoma and  ductal carcinoma in situ with calcifications and lymphovascular invasion in the posterior right breast and a complex sclerosing lesion with calcifications in the anterior right breast (excision is recommended). This was found to be concordant by Dr. Ammie Ferrier. MRI should be considered prior to excision given the multifocal abnormalities to evaluate the extent of disease. Pathology was discussed with the patient by telephone. She reported doing well after the biopsies with tenderness at the sites. The patient has been scheduled at [facility] on December 10, 2015. My number was provided for additional questions and concerns. Pathology results reported by Susa Raring RN, BSN on November 28, 2015. Electronically Signed   By: Ammie Ferrier M.D.   On: 11/28/2015 12:40  11/28/2015  CLINICAL DATA:  45 year old female presenting for stereotactic biopsy of 2 sites of calcifications in the right breast. EXAM: RIGHT BREAST STEREOTACTIC CORE NEEDLE BIOPSY COMPARISON:  Previous exams. FINDINGS: The patient and I discussed the procedure of stereotactic-guided biopsy including benefits and alternatives. We discussed the high likelihood of a successful procedure. We discussed the risks of the procedure including infection, bleeding, tissue injury, clip migration, and inadequate sampling. Informed written consent was given. The usual time out protocol was performed immediately prior to the procedure. Using sterile technique and 1% lidocaine as local anesthetic, under stereotactic guidance, a 9 gauge vacuum assisted device was used to perform core needle biopsy of calcifications in the right breast labeled " upper-outer quadrant posterior" using a lateral approach. Specimen radiograph was performed showing numerous calcifications within several cores. Specimens with calcifications are identified for pathology. At the conclusion of the procedure, a coil shaped tissue marker clip was  deployed into the biopsy cavity. Next, again using sterile technique and 1% lidocaine as local anesthetic, under stereotactic guidance, a 9 gauge vacuum assisted device was used to perform core needle biopsy of calcifications in the right breast labeled " upper-outer quadrant anterior " using a lateral approach. Specimen radiograph was performed showing numerous calcifications within several cores. Specimens with calcifications are identified for pathology. At the conclusion of the procedure, a X shaped tissue marker clip was deployed into the biopsy cavity. Follow-up 2-view mammogram was performed and dictated separately. IMPRESSION: Stereotactic-guided biopsy of 2 sites of calcifications in the upper-outer quadrant of the right breast. No apparent complications. Electronically Signed: By: Ammie Ferrier M.D. On: 11/27/2015 11:17   Mm Rt Breast Bx W Loc Dev Ea Ad Lesion Img Bx Spec Stereo Guide  11/28/2015  ADDENDUM REPORT: 11/28/2015 12:40 ADDENDUM: Pathology revealed grade II invasive ductal carcinoma and ductal carcinoma in situ with calcifications and lymphovascular invasion in the posterior right breast and a complex sclerosing lesion with calcifications in the anterior right breast (excision is recommended). This was found to be concordant by Dr. Ammie Ferrier. MRI should be considered prior to excision given the multifocal abnormalities to evaluate the extent of disease. Pathology was discussed with the patient by telephone. She reported doing  well after the biopsies with tenderness at the sites. The patient has been scheduled at [facility] on December 10, 2015. My number was provided for additional questions and concerns. Pathology results reported by Susa Raring RN, BSN on November 28, 2015. Electronically Signed   By: Ammie Ferrier M.D.   On: 11/28/2015 12:40  11/28/2015  CLINICAL DATA:  45 year old female presenting for stereotactic biopsy of 2 sites  of calcifications in the right breast. EXAM: RIGHT BREAST STEREOTACTIC CORE NEEDLE BIOPSY COMPARISON:  Previous exams. FINDINGS: The patient and I discussed the procedure of stereotactic-guided biopsy including benefits and alternatives. We discussed the high likelihood of a successful procedure. We discussed the risks of the procedure including infection, bleeding, tissue injury, clip migration, and inadequate sampling. Informed written consent was given. The usual time out protocol was performed immediately prior to the procedure. Using sterile technique and 1% lidocaine as local anesthetic, under stereotactic guidance, a 9 gauge vacuum assisted device was used to perform core needle biopsy of calcifications in the right breast labeled " upper-outer quadrant posterior" using a lateral approach. Specimen radiograph was performed showing numerous calcifications within several cores. Specimens with calcifications are identified for pathology. At the conclusion of the procedure, a coil shaped tissue marker clip was deployed into the biopsy cavity. Next, again using sterile technique and 1% lidocaine as local anesthetic, under stereotactic guidance, a 9 gauge vacuum assisted device was used to perform core needle biopsy of calcifications in the right breast labeled " upper-outer quadrant anterior " using a lateral approach. Specimen radiograph was performed showing numerous calcifications within several cores. Specimens with calcifications are identified for pathology. At the conclusion of the procedure, a X shaped tissue marker clip was deployed into the biopsy cavity. Follow-up 2-view mammogram was performed and dictated separately. IMPRESSION: Stereotactic-guided biopsy of 2 sites of calcifications in the upper-outer quadrant of the right breast. No apparent complications. Electronically Signed: By: Ammie Ferrier M.D. On: 11/27/2015 11:17       IMPRESSION: 45 year old female with a [id number] invasive ductal  carcinoma of the right breast. She also has a complex sclerosing lesion in the right breast approximately 6 cm anterior to the invasive tumor. Both areas are recommended for removal.  PLAN: She will be referred to genetic counseling and oncotype testing. The patient would get an MRI scan to help determine if a lumpectomy is feasible. If she proceeds with a lumpectomy, then adjuvant radiation therapy would be required. If she undergoes mastectomy, then no anticipated need for radiation treatment at this time.  The patient lives in [place] may be interested in treatment there.     ________________________________   Jodelle Gross, MD, PhD  This document serves as a record of services personally performed by Kyung Rudd, MD. It was created on his behalf by Darcus Austin, a trained medical scribe. The creation of this record is based on the scribe's personal observations and the provider's statements to them. This document has been checked and approved by the attending provider.

## 2015-12-10 NOTE — Progress Notes (Signed)
Subjective:     Patient ID: Kiara Spencer, female   DOB: 1971-02-19, 45 y.o.   MRN: [id number]  HPI   Review of Systems     Objective:   Physical Exam For the patient to understand and be given the tools to implement a healthy plant based diet during their cancer diagnosis.     Assessment:     Patient was seen today and found to be in good spiritis and accompanied by her husband. Pts ht 5'1'', 152 pounds, BMI 28.4. Pt seemed very interested and took notes during the appointment.      Plan:     Dietitian educated the patient on implementing a plant based diet by incorporating more plant proteins, fruits, and vegetables. As a part of a healthy routine physical activity was discussed. A folder of evidence based information with a focus on a plant based diet and general nutrition during cancer was given to the patient.  The importance of legitimate, evidence based information was discussed and examples were given. As a part of the continuum of care the cancer dietitian's contact information was given to the patient in the event they would like to have a follow up appointment.

## 2015-12-10 NOTE — Progress Notes (Signed)
[place] NOTE  Patient Care Team: Brien Few, MD as PCP - General (Obstetrics and Gynecology) [name], MD as Consulting Physician (General Surgery) Nicholas Lose, MD as Consulting Physician (Hematology and Oncology) Kyung Rudd, MD as Consulting Physician (Radiation Oncology)  CHIEF COMPLAINTS/PURPOSE OF CONSULTATION:  Newly diagnosed breast cancer  HISTORY OF PRESENTING ILLNESS:  Kiara Spencer 45 y.o. female is here because of recent diagnosis of  Right breast cancer. She felt that lump in the right breast and then was referred to mammogram and ultrasound. The mammogram report. 2.5 cm area in the upper outer quadrant posteriorly there this by ultrasound measured 1.2 cm. Anterior to this the mammogram reported at 1.2 cm area of abnormality. Both of these areas were biopsied. 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 She was present illness Brayton Layman the multidisciplinary tumor board and she is here today to discuss a treatment plan.  I reviewed her records extensively and collaborated the history with the patient.  SUMMARY OF ONCOLOGIC HISTORY:   Breast cancer of upper-outer quadrant of right female breast ([place])   11/24/2015 Mammogram 2 suspicious groups of calcifications right breast UOQ posteriorly 2.2 x 2.3 x 2.5 cm (by U/S measured 1.2 x 0.8 x 1.2 cm); anteriorly 1.1 x 1.2 x 0.8 cm (not seen by ultrasound)   11/27/2015 Initial Diagnosis Right breast biopsy UOQ Posterior: IDC with DCIS with, LVI present, ER 100%, PR 20%, Ki-67 30%, HER-2 negative; ANTERIOR: Complex sclerosing lesions with calcifications   MEDICAL HISTORY:  Past Medical History  Diagnosis Date  . Breast cancer of upper-outer quadrant of right female breast ([place]) 12/02/2015  . Breast cancer [facility])     SURGICAL HISTORY: Past Surgical History  Procedure Laterality Date  . Cesarean  section  x 2    SOCIAL HISTORY: Social History   Social History  . Marital Status: Married    Spouse Name: N/A  . Number of Children: N/A  . Years of Education: N/A   Occupational History  . Not on file.   Social History Main Topics  . Smoking status: Never Smoker   . Smokeless tobacco: Not on file  . Alcohol Use: No  . Drug Use: No  . Sexual Activity: Not on file   Other Topics Concern  . Not on file   Social History Narrative    FAMILY HISTORY: Family History  Problem Relation Age of Onset  . Breast cancer Mother   . Breast cancer Maternal Aunt   . Leukemia Maternal Uncle     ALLERGIES:  has no allergies on file.  MEDICATIONS:  Current Outpatient Prescriptions  Medication Sig Dispense Refill  . aspirin-acetaminophen-caffeine (EXCEDRIN MIGRAINE) 250-250-65 MG tablet Take by mouth every 6 (six) hours as needed for headache.     No current facility-administered medications for this visit.    REVIEW OF SYSTEMS:   Constitutional: Denies fevers, chills or abnormal night sweats Eyes: Denies blurriness of vision, double vision or watery eyes Ears, nose, mouth, throat, and face: Denies mucositis or sore throat Respiratory: Denies cough, dyspnea or wheezes Cardiovascular: Denies palpitation, chest discomfort or lower extremity swelling Gastrointestinal:  Denies nausea, heartburn or change in bowel habits Skin: Denies abnormal skin rashes Lymphatics: Denies new lymphadenopathy or easy bruising Neurological:Denies numbness, tingling or new weaknesses Behavioral/Psych: Mood is stable, no new changes  Breast:  Right breast upper outer quadrant palpable lesion All other systems were reviewed  with the patient and are negative.  PHYSICAL EXAMINATION: ECOG PERFORMANCE STATUS: 1 - Symptomatic but completely ambulatory  Filed Vitals:   12/10/15 0859  BP: 122/69  Pulse: 82  Temp: 98.6 F (37 C)  Resp: 18   Filed Weights   12/10/15 0859  Weight: 152 lb 4.8 oz  (69.083 kg)    GENERAL:alert, no distress and comfortable SKIN: skin color, texture, turgor are normal, no rashes or significant lesions EYES: normal, conjunctiva are pink and non-injected, sclera clear OROPHARYNX:no exudate, no erythema and lips, buccal mucosa, and tongue normal  NECK: supple, thyroid normal size, non-tender, without nodularity LYMPH:  no palpable lymphadenopathy in the cervical, axillary or inguinal LUNGS: clear to auscultation and percussion with normal breathing effort HEART: regular rate & rhythm and no murmurs and no lower extremity edema ABDOMEN:abdomen soft, non-tender and normal bowel sounds Musculoskeletal:no cyanosis of digits and no clubbing  PSYCH: alert & oriented x 3 with fluent speech NEURO: no focal motor/sensory deficits BREAST: palpable lesion in the right breast measuring 1.5 cm to palpation. No palpable axillary or supraclavicular lymphadenopathy (exam performed in the presence of a chaperone)   LABORATORY DATA:  I have reviewed the data as listed Lab Results  Component Value Date   WBC 4.2 12/10/2015   HGB 13.0 12/10/2015   HCT 40.1 12/10/2015   MCV 88.8 12/10/2015   PLT 257 12/10/2015   Lab Results  Component Value Date   NA 142 12/10/2015   K 3.7 12/10/2015   CO2 27 12/10/2015    RADIOGRAPHIC STUDIES: I have personally reviewed the radiological reports and agreed with the findings in the report.  ASSESSMENT AND PLAN:  Breast cancer of upper-outer quadrant of right female breast ([place]) Right breast biopsy 11/27/2015: UOQ Posterior: IDC with DCIS with, LVI present, ER 100%, PR 20%, Ki-67 30%, HER-2 negative; ANTERIOR: Complex sclerosing lesions with calcifications. 2 suspicious groups of calcifications right breast UOQ posteriorly 2.2 x 2.3 x 2.5 cm (by U/S measured 1.2 x 0.8 x 1.2 cm); anteriorly 1.1 x 1.2 x 0.8 cm (not seen by ultrasound)  Pathology and radiology counseling: Discussed with the patient, the details of pathology  including the type of breast cancer,the clinical staging, the significance of ER, PR and HER-2/neu receptors and the implications for treatment. After reviewing the pathology in detail, we proceeded to discuss the different treatment options between surgery, radiation, chemotherapy, antiestrogen therapies.  Recommendation: 1. Genetic counseling 2. Breast MRI 3. Breast conserving surgery versus mastectomy based upon breast MRI results 4. Oncotype DX and the final pathology specimen to determine if she would benefit from chemotherapy 5. Followed by adjuvant radiation therapy if she has breast conserving surgery 6. Followed by adjuvant antiestrogen therapy with tamoxifen 5-10 years  Oncotype counseling: I discussed Oncotype DX test. I explained to the patient that this is a 21 gene panel to evaluate patient tumors DNA to calculate recurrence score. This would help determine whether patient has high risk or intermediate risk or low risk breast cancer. She understands that if her tumor was found to be high risk, she would benefit from systemic chemotherapy. If low risk, no need of chemotherapy. If she was found to be intermediate risk, we would need to evaluate the score as well as other risk factors and determine if an abbreviated chemotherapy may be of benefit.  Return to clinic after surgery to discuss final pathology report and to determine if Oncotype DX should be sent  All questions were answered. The patient knows to  call the clinic with any problems, questions or concerns.    Rulon Eisenmenger, MD 12/10/2015

## 2015-12-10 NOTE — Therapy (Signed)
[place], [place], [address] Phone: [phone]   Fax:  [phone]  Physical Therapy Evaluation  Patient Details  Name: Kiara Spencer MRN: [id number] Date of Birth: [birth year] Referring Provider: Dr. [name]  Encounter Date: 12/10/2015      PT End of Session - 12/10/15 1054    Visit Number 1   Number of Visits 1   PT Start Time 0488   PT Stop Time 8916  Also saw pt from 1112-1130 for a total of 26 minutes   PT Time Calculation (min) 8 min   Activity Tolerance Patient tolerated treatment well   Behavior During Therapy [facility] for tasks assessed/performed      Past Medical History  Diagnosis Date  . Breast cancer of upper-outer quadrant of right female breast ([place]) 12/02/2015  . Breast cancer [facility])     Past Surgical History  Procedure Laterality Date  . Cesarean section  x 2    There were no vitals filed for this visit.  Visit Diagnosis:  Carcinoma of upper-outer quadrant of right female breast [facility]) - Plan: PT plan of care cert/re-cert      Subjective Assessment - 12/10/15 1047    Subjective Patient was seen today for a baseline assessment of her newly diagnosed right breast cancer.   Patient is accompained by: Family member   Pertinent History Patient was diagnosed on 11/24/15 with right invasive ductal carcinoma with DCIS breast cancer in the upper outer quadrant with 2 seperate areas, the largest measuring 2.5 cm. It is ER/PR positive and HER2 negative.   Patient Stated Goals Reduce lymphedema risk and learn post op shoulder ROM HEP   Currently in Pain? No/denies            [facility] PT Assessment - 12/10/15 0001    Assessment   Medical Diagnosis Right breast cancer   Referring Provider Dr. [name]   Onset Date/Surgical Date 11/24/15   Hand Dominance Right   Prior Therapy none   Precautions   Precautions Other (comment)   Precaution Comments Active breast cancer   Restrictions   Weight Bearing Restrictions No   Balance Screen   Has the patient fallen in the past 6 months No   Has the patient had a decrease in activity level because of a fear of falling?  No   Is the patient reluctant to leave their home because of a fear of falling?  No   Home Social worker Private residence   Living Arrangements Spouse/significant other;Children  Husband, 46 and 26 y.o. 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   Cognition   Overall Cognitive Status Within Functional Limits for tasks assessed   Posture/Postural Control   Posture/Postural Control No significant limitations   Postural Limitations --   ROM / Strength   AROM / PROM / Strength AROM;Strength   AROM   AROM Assessment Site Shoulder   Right/Left Shoulder Right;Left   Right Shoulder Extension 48 Degrees   Right Shoulder Flexion 153 Degrees   Right Shoulder ABduction 173 Degrees   Right Shoulder Internal Rotation 78 Degrees   Right Shoulder External Rotation 82 Degrees   Left Shoulder Extension 46 Degrees   Left Shoulder Flexion 145 Degrees   Left Shoulder ABduction 156 Degrees  Left Shoulder Internal Rotation 75 Degrees   Left Shoulder External Rotation 90 Degrees   Strength   Overall Strength Within functional limits for tasks performed           LYMPHEDEMA/ONCOLOGY QUESTIONNAIRE - 12/10/15 1053    Type   Cancer Type Right breast cancer   Lymphedema Assessments   Lymphedema Assessments Upper extremities   Right Upper Extremity Lymphedema   10 cm Proximal to Olecranon Process 29.9 cm   Olecranon Process 22.7 cm   10 cm Proximal to Ulnar Styloid Process 21.8 cm   Just Proximal to Ulnar Styloid Process 14.2 cm   Across Hand at [employer] 17.5 cm   At [place] of 2nd Digit 5.9 cm    Left Upper Extremity Lymphedema   10 cm Proximal to Olecranon Process 29.5 cm   Olecranon Process 22.5 cm   10 cm Proximal to Ulnar Styloid Process 20.5 cm   Just Proximal to Ulnar Styloid Process 13.8 cm   Across Hand at [employer] 17.1 cm   At [place] of 2nd Digit 5.7 cm          Patient was instructed today in a home exercise program today for post op shoulder range of motion. These included active assist shoulder flexion in sitting, scapular retraction, wall walking with shoulder abduction, and hands behind head external rotation.  She was encouraged to do these twice a day, holding 3 seconds and repeating 5 times when permitted by her physician.                  PT Education - 12/10/15 1054    Education provided Yes   Education Details Post op shoulder ROM HEP and lymphedema risk reduction   Person(s) Educated Patient;Spouse   Methods Explanation;Demonstration;Handout   Comprehension Verbalized understanding;Returned demonstration              Breast Clinic Goals - 12/10/15 1059    Patient will be able to verbalize understanding of pertinent lymphedema risk reduction practices relevant to her diagnosis specifically related to skin care.   Time 1   Period Days   Status Achieved   Patient will be able to return demonstrate and/or verbalize understanding of the post-op home exercise program related to regaining shoulder range of motion.   Time 1   Period Days   Status Achieved   Patient will be able to verbalize understanding of the importance of attending the postoperative After Breast Cancer Class for further lymphedema risk reduction education and therapeutic exercise.   Time 1   Period Days   Status Achieved              Plan - 12/10/15 1056    Clinical Impression Statement Patient was diagnosed on 11/24/15 with right invasive ductal carcinoma with DCIS breast cancer in the upper outer quadrant with 2 seperate areas, the largest measuring  2.5 cm. It is ER/PR positive and HER2 negative.  She is planning to have a breast MRI and genetic testing.  Depending on those results, she will undergo either a right lumpectomy and sentinel node biopsy or a right mastectomy and sentinel node biopsy with or without reconstruction.  She will undergo radiation if she has a lumpectomy and anti-estrogen therapy.  She may benefit from post op PT to regain shoulder ROM and strength and reduce her risk for lymphedema.   Pt will benefit from skilled therapeutic intervention in order to improve on the following deficits Decreased strength;Pain;Decreased knowledge of  precautions;Impaired UE functional use;Decreased range of motion   Rehab Potential Excellent   Clinical Impairments Affecting Rehab Potential none   PT Frequency One time visit   PT Treatment/Interventions Therapeutic exercise;Patient/family education   PT Next Visit Plan Will f/u after surgery   Consulted and Agree with Plan of Care Patient;Family member/caregiver   Family Member Consulted Husband     Patient will follow up at outpatient cancer rehab if needed following surgery.  If the patient requires physical therapy at that time, a specific plan will be dictated and sent to the referring physician for approval. The patient was educated today on appropriate basic range of motion exercises to begin post operatively and the importance of attending the After Breast Cancer class following surgery.  Patient was educated today on lymphedema risk reduction practices as it pertains to recommendations that will benefit the patient immediately following surgery.  She verbalized good understanding.  No additional physical therapy is indicated at this time.       Problem List Patient Active Problem List   Diagnosis Date Noted  . Breast cancer of upper-outer quadrant of right female breast ([place]) 12/02/2015    Annia Friendly, PT 12/10/2015 12:01 PM   [place], [place], [address] Phone: [phone]   Fax:  [phone]  Name: Nashae Maudlin MRN: [id number] Date of Birth: [birth year]

## 2015-12-10 NOTE — Progress Notes (Signed)
Ms. Brotherton is a very pleasant 45 y.o. 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  Biopsy results revealed the tumor's prognostic profile is ER positive, PR positive and HER2/neu negative. Ki67 is 30%.  She presents today with her husband to the [facility]) for treatment consideration and recommendations from the breast surgeon, radiation oncologist, and medical oncologist.     I briefly met with Ms. Wheeland and her husband during her [facility] visit today. We discussed the purpose of the Survivorship Clinic, which will include monitoring for recurrence, coordinating completion of age and gender-appropriate cancer screenings, promotion of overall wellness, as well as managing potential late/long-term side effects of anti-cancer treatments.    The treatment plan for Ms. Naclerio will likely include surgery (lumpectomy vs. mastectomy pending MRI and genetic testing results), radiation therapy, and anti-estrogen therapy.  She will meet with the Genetics Counselor due to her age and family history. As of today, the intent of treatment for Ms. Crail is cure, therefore she will be eligible for the Survivorship Clinic upon her completion of treatment.  Her survivorship care plan (SCP) document will be drafted and updated throughout the course of her treatment trajectory. She will receive the SCP in an office visit with myself in the Survivorship Clinic once she has completed treatment.   Ms. Bufano was encouraged to ask questions and all questions were answered to her satisfaction.  She was given my business card and encouraged to contact me with any concerns regarding survivorship.  I look forward to participating in her care.   Kenn File, [place] [phone]

## 2015-12-11 ENCOUNTER — Ambulatory Visit (HOSPITAL_BASED_OUTPATIENT_CLINIC_OR_DEPARTMENT_OTHER): Payer: BLUE CROSS/BLUE SHIELD | Admitting: Genetic Counselor

## 2015-12-11 ENCOUNTER — Encounter: Payer: Self-pay | Admitting: Radiation Oncology

## 2015-12-11 ENCOUNTER — Other Ambulatory Visit: Payer: BLUE CROSS/BLUE SHIELD

## 2015-12-11 ENCOUNTER — Encounter: Payer: Self-pay | Admitting: Genetic Counselor

## 2015-12-11 DIAGNOSIS — Z803 Family history of malignant neoplasm of breast: Secondary | ICD-10-CM | POA: Diagnosis not present

## 2015-12-11 DIAGNOSIS — Z800 Family history of malignant neoplasm of digestive organs: Secondary | ICD-10-CM

## 2015-12-11 DIAGNOSIS — Z8042 Family history of malignant neoplasm of prostate: Secondary | ICD-10-CM

## 2015-12-11 DIAGNOSIS — Z806 Family history of leukemia: Secondary | ICD-10-CM

## 2015-12-11 DIAGNOSIS — Z809 Family history of malignant neoplasm, unspecified: Secondary | ICD-10-CM

## 2015-12-11 DIAGNOSIS — C50411 Malignant neoplasm of upper-outer quadrant of right female breast: Secondary | ICD-10-CM

## 2015-12-11 DIAGNOSIS — Z315 Encounter for genetic counseling: Secondary | ICD-10-CM

## 2015-12-11 NOTE — Addendum Note (Signed)
Encounter addended by: Kyung Rudd, MD on: 12/11/2015 11:27 AM<BR>     Documentation filed: Follow-up Section, LOS Section

## 2015-12-11 NOTE — Progress Notes (Signed)
REFERRING PROVIDER: Brien Few, MD [place], [place] [address]  PRIMARY PROVIDER:  Lovenia Kim, MD  PRIMARY REASON FOR VISIT:  1. Breast cancer of upper-outer quadrant of right female breast ([place])   2. Family history of breast cancer in mother   42. Family history of prostate cancer   4. Family history of pancreatic cancer   5. Family history of leukemia   6. Family history of throat cancer   7. Family history of cancer      HISTORY OF PRESENT ILLNESS:   Kiara Spencer, a 45 y.o. female, was seen for a [facility] cancer genetics consultation at the request of Dr. Lindi Adie due to a personal history of breast cancer at 31 and family history of breast, prostate, and other cancers.  Kiara Spencer presents to clinic today with her mother to discuss the possibility of a hereditary predisposition to cancer, genetic testing, and to further clarify her future cancer risks, as well as potential cancer risks for family members.   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  Hormone receptor status was ER/PR+, Her2-.  Surgical and treatment decisions will be further informed by genetic test results.   CANCER HISTORY:    Breast cancer of upper-outer quadrant of right female breast ([place])   11/24/2015 Mammogram 2 suspicious groups of calcifications right breast UOQ posteriorly 2.2 x 2.3 x 2.5 cm (by U/S measured 1.2 x 0.8 x 1.2 cm); anteriorly 1.1 x 1.2 x 0.8 cm (not seen by ultrasound)   11/27/2015 Initial Diagnosis Right breast biopsy UOQ Posterior: IDC with DCIS with, LVI present, ER 100%, PR 20%, Ki-67 30%, HER-2 negative; ANTERIOR: Complex sclerosing lesions with calcifications     HORMONAL RISK FACTORS:  Menarche was at age 32.  First live birth at age 13.  OCP use for approximately 15 years.  Ovaries intact: yes.  Hysterectomy: no.  Menopausal status: premenopausal.  HRT use: 0  years. Colonoscopy: no; not examined. Mammogram within the last year: yes. Number of breast biopsies: 1. Up to date with pelvic exams:  yes. Any excessive radiation exposure in the past:  no  Past Medical History  Diagnosis Date  . Breast cancer of upper-outer quadrant of right female breast ([place]) 12/02/2015  . Breast cancer [facility])     Past Surgical History  Procedure Laterality Date  . Cesarean section  x 2    Social History   Social History  . Marital Status: Married    Spouse Name: N/A  . Number of Children: N/A  . Years of Education: N/A   Social History Main Topics  . Smoking status: Never Smoker   . Smokeless tobacco: Never Used  . Alcohol Use: No  . Drug Use: No  . Sexual Activity: Not Asked   Other Topics Concern  . None   Social History Narrative     FAMILY HISTORY:  We obtained a detailed, 4-generation family history.  Significant diagnoses are listed below: Family History  Problem Relation Age of Onset  . Breast cancer Mother 13    s/p partial mastectomy  . Colon polyps Mother     3 polyps total  . Breast cancer Maternal Aunt 64  . Leukemia Maternal Uncle 47  . Heart attack Paternal Aunt   . Heart attack Maternal Grandmother   . Heart attack Maternal Grandfather   . Lupus Maternal Aunt   . Prostate cancer Maternal Uncle  80  . Prostate cancer Maternal Uncle 72  . Prostate cancer Maternal Uncle   . Heart attack Maternal Uncle   . Prostate cancer Maternal Uncle     dx. 37s   . Breast cancer Cousin     dx. early 51s or younger; may have had genetic testing  . Cancer Cousin     "rare" cancer, dx. 40s  . Pancreatic cancer Cousin     dx. 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 Throat cancer Paternal Uncle     dx. early 64s  . Cancer Other     unspecified type    Kiara Spencer has two daughters, ages 53 and 82.  She is an only child.  Kiara Spencer's mother is currently 34 and has a history of breast cancer,  diagnosed at age 46.  This was treated with a partial mastectomy.  She also has a history of a few colon polyps.  Kiara Spencer's father is currently 33 and has never had cancer or colon polyps.    Kiara Spencer's mother has three full sisters and seven full brothers.  One sister died of breast cancer at 1.  She had all sons who have not been diagnosed with cancer.  Four brothers have a history of prostate cancer, the youngest diagnosis of which was diagnosed in his 39s and the oldest diagnosis was in his 70s.  One of these brothers had a daughter (1/3 daughters) diagnosed with breast cancer in her early 56s and a son (1/3 sons) who was diagnosed with a "rare" cancer at age 73.  Kiara Spencer's mother believes that the cousin diagnosed with early-onset breast cancer may have had negative genetic testing within the last couple of years, but she is not certain.  Kiara Spencer's mother also has three maternal half-siblings and one maternal half-brother. This half-brother died of leukemia at the age of 28.  One half-sister who is currently 59 and has never had cancer has three sons, one of whom died of pancreatic cancer in his 7s (he was not a smoker or heavy drinker, but may have been experienced some carcinogenic workplace exposures through his work at a shipping yard).  Kiara Spencer's maternal grandmother died of a heart attack at 84.  Her maternal grandfather died of a heart attack at 67.  She has limited information for her maternal great aunts/uncles and great grandparents, but her reports that most died of old age.    Kiara Spencer's father had two full brothers and one full sister.  His sister died of a heart attack at 61.  She had one son and he has not had cancer.  One of these paternal uncles died as a child; he had features of autism and intellectual disabilities.  The other paternal uncle, a smoker, died of throat cancer which was diagnosed in his early 55s.  He had one son and one daughter, neither of whom have  ever been diagnosed with cancer.  Kiara Spencer's paternal grandmother died of a heart attack in her 73s.  Her mother had a history of an unspecified type of cancer.  Kiara Spencer's paternal grandfather died of either a heart attack or aneurysm in his mid-33s.  Kiara Spencer had some limited information for many paternal great aunts/uncles and great grandparents, but did call her father on the phone and he reported no additional known cancer diagnoses in his family.  Patient's maternal and paternal ancestors are of African American descent.  There is no reported Ashkenazi Jewish ancestry. There is no known consanguinity.  GENETIC COUNSELING ASSESSMENT: Kiara Spencer is a 45 y.o. female with a personal and family history of cancer which is somewhat suggestive of a hereditary breast cancer syndrome and predisposition to cancer. We, therefore, discussed and recommended the following at today's visit.   DISCUSSION: We reviewed the characteristics, features and inheritance patterns of hereditary cancer syndromes, particularly those caused by mutations within the BRCA1/2 and PALB2 gene. We also discussed genetic testing, including the appropriate family members to test, the process of testing, insurance coverage and turn-around-time for results. We discussed the implications of a negative, positive and/or variant of uncertain significant result. We recommended Kiara Spencer pursue genetic testing for the 20-gene Breast/Ovarian Cancer Panel through [facility] Hope Pigeon, MD).  The Breast/Ovarian Cancer Panel offered by [facility] Hope Pigeon, MD) includes sequencing and deletion/duplication analysis for the following 19 genes:  ATM, BARD1, BRCA1, BRCA2, BRIP1, CDH1, CHEK2, FANCC, MLH1, MSH2, MSH6, NBN, PALB2, PMS2, PTEN, RAD51C, RAD51D, TP53, and XRCC2.  This panel also includes deletion/duplication analysis (without sequencing) for one gene, EPCAM.   Based on Kiara Spencer's personal and family  history of cancer, she meets medical criteria for genetic testing. Despite that she meets criteria, she may still have an out of pocket cost. We discussed that if her out of pocket cost for testing is over $100, the laboratory will call and confirm whether she wants to proceed with testing.  If the out of pocket cost of testing is less than $100 she will be billed by the genetic testing laboratory.   PLAN: After considering the risks, benefits, and limitations, Kiara Spencer  provided informed consent to pursue genetic testing and the blood sample was sent to [facility] for analysis of the 20-gene Breast/Ovarian Cancer Panel test. Results are generally available within approximately 2-3 weeks' time, however, since these results will be helpful in determining treatment/surgical decisions, we will ask that [facility] return these results STAT.  Thus, we should be able to expect results back closer to a 2-week timeframe, at which point they will be disclosed by telephone to Kiara Spencer, as will any additional recommendations warranted by these results. Ms. Sanville will receive a summary of her genetic counseling visit and a copy of her results once available. This information will also be available in Epic. We encouraged Ms. Battaglia to remain in contact with cancer genetics annually so that we can continuously update the family history and inform her of any changes in cancer genetics and testing that may be of benefit for her family. Ms. Tomson's questions were answered to her satisfaction today. Our contact information was provided should additional questions or concerns arise.  Thank you for the referral and allowing [place] to share in the care of your patient.   Jeanine Luz, MS Genetic Counselor [email or web address] Phone: [phone]  The patient was seen for a total of 65 minutes in face-to-face genetic counseling.  This patient was discussed with Drs. Magrinat, Lindi Adie and/or Burr Medico who agrees with  the above.    _______________________________________________________________________ For Office Staff:  Number of people involved in session: 2 Was an Intern/ student involved with case: no

## 2015-12-12 ENCOUNTER — Other Ambulatory Visit: Payer: Self-pay | Admitting: Hematology and Oncology

## 2015-12-16 ENCOUNTER — Encounter: Payer: Self-pay | Admitting: *Deleted

## 2015-12-16 ENCOUNTER — Telehealth: Payer: Self-pay | Admitting: *Deleted

## 2015-12-16 ENCOUNTER — Ambulatory Visit
Admission: RE | Admit: 2015-12-16 | Discharge: 2015-12-16 | Disposition: A | Discharge status: Home / Self Care | Payer: BLUE CROSS/BLUE SHIELD | Source: Ambulatory Visit | Attending: General Surgery | Admitting: General Surgery

## 2015-12-16 DIAGNOSIS — C50411 Malignant neoplasm of upper-outer quadrant of right female breast: Secondary | ICD-10-CM

## 2015-12-16 MED ORDER — GADOBENATE DIMEGLUMINE 529 MG/ML IV SOLN
14.0000 mL | Freq: Once | INTRAVENOUS | Status: AC | PRN
  Administered 2015-12-16: 14 mL via INTRAVENOUS

## 2015-12-16 NOTE — Telephone Encounter (Signed)
Received call from patient asking about if [facility] being open tomorrow for her MRI.  Informed her if they did close we would reschedule her MRI.  No other questions at this time.  Encouraged her to call with any needs or concerns.

## 2015-12-22 ENCOUNTER — Telehealth: Payer: Self-pay | Admitting: Genetic Counselor

## 2015-12-22 ENCOUNTER — Ambulatory Visit: Payer: Self-pay | Admitting: Genetic Counselor

## 2015-12-22 DIAGNOSIS — Z809 Family history of malignant neoplasm, unspecified: Secondary | ICD-10-CM

## 2015-12-22 DIAGNOSIS — Z1379 Encounter for other screening for genetic and chromosomal anomalies: Secondary | ICD-10-CM | POA: Insufficient documentation

## 2015-12-22 DIAGNOSIS — C50411 Malignant neoplasm of upper-outer quadrant of right female breast: Secondary | ICD-10-CM

## 2015-12-22 DIAGNOSIS — Z803 Family history of malignant neoplasm of breast: Secondary | ICD-10-CM

## 2015-12-22 NOTE — Telephone Encounter (Signed)
Discussed with Kiara Spencer that her genetic test results were negative for pathogenic mutations within any of 20 genes that would cause her to be at an increased risk for breast, ovarian, or other related cancers.  Additionally, no uncertain changes were found.  We discussed this negative result in light of her personal history of early-onset breast cancer and her family history of breast, prostate, and pancreatic cancers.  We discussed that this history still looks somewhat suspicious for a hereditary cancer syndrome, and that, since we can never be certain that we are testing all the right genes at this point in time, that sometimes this might warrant additional breast cancer screening in the future to be cautious.  Annual breast MRIs in additional to annual mammogram is an option that she may want to further discuss with Dr. Lindi Adie.  Kiara Spencer's maternal first cousin, Kiara Spencer, who had breast cancer in her early 45s, also had negative genetic testing in the past (maybe about 2 years ago).  We discussed then that women in the family are likely at an increased risk for breast cancer, and that Kiara Spencer's daughters could begin annual mammogram screening at the age of 86, based on current guidelines.  Kiara Spencer should keep in touch with [place] in the future, update the family history with [place], as well as recontact [place] to find out about updated genetic testing options at that point in time.

## 2015-12-22 NOTE — Progress Notes (Signed)
GENETIC TEST RESULT  HPI: Ms. Bruinsma was previously seen in the [place] clinic due to a personal history of breast cancer at age 45, family history of breast, prostate, and pancreatic cancers, and concerns regarding a hereditary predisposition to cancer. Please refer to our prior cancer genetics clinic note from December 11, 2015 for more information regarding Ms. Jackson's medical, social and family histories, and our assessment and recommendations, at the time. Ms. Kallenbach's recent genetic test results were disclosed to her, as were recommendations warranted by these results. These results and recommendations are discussed in more detail below.  GENETIC TEST RESULTS: At the time of Ms. Karasik's visit on 12/11/15, we recommended she pursue genetic testing of the 20-gene Breast/Ovarian Cancer Panel through [facility].  The Breast/Ovarian Cancer Panel offered by [facility] Hope Pigeon, MD) includes sequencing and deletion/duplication analysis for the following 19 genes:  ATM, BARD1, BRCA1, BRCA2, BRIP1, CDH1, CHEK2, FANCC, MLH1, MSH2, MSH6, NBN, PALB2, PMS2, PTEN, RAD51C, RAD51D, TP53, and XRCC2.  This panel also includes deletion/duplication analysis (without sequencing) for one gene, EPCAM.  Those results are now back, the report date for which is December 22, 2015.  Genetic testing was normal, and did not reveal a deleterious mutation in these genes.  Additionally, no variants of uncertain significance (VUSes) were found.  The test report will be scanned into EPIC and will be located under the Results Review tab in the Pathology>Molecular Pathology section.   We discussed with Ms. Montelongo that since the current genetic testing is not perfect, it is possible there may be a gene mutation in one of these genes that current testing cannot detect, but that chance is small. We also discussed, that it is possible that another gene that has not yet been discovered, or that we  have not yet tested, is responsible for the cancer diagnoses in the family, and it is, therefore, important to remain in touch with cancer genetics in the future so that we can continue to offer Ms. Huettner the most up to date genetic testing.   CANCER SCREENING RECOMMENDATIONS: Given Ms. Basilio's personal and family histories, we must interpret these negative results with some caution.  Families with features suggestive of hereditary risk for cancer tend to have multiple family members with cancer, diagnoses in multiple generations and diagnoses before the age of 46. Ms. Drier's family exhibits some of these features. Thus this result may simply reflect our current inability to detect all mutations within these genes or there may be a different gene that has not yet been discovered or tested.   We, therefore, discussed that it is reasonable for Ms. Zehnder to discuss with her doctor, the option of being followed by a high-risk breast cancer clinic; in addition to a yearly mammogram and physical exam by a healthcare provider, she should discuss the usefulness of an annual breast MRI with the high-risk clinic providers.   RECOMMENDATIONS FOR FAMILY MEMBERS: Women in this family might be at some increased risk of developing cancer, over the general population risk, simply due to the family history of cancer. We recommended women in this family have a yearly mammogram beginning at age 40, or 11 years younger than the earliest onset of cancer, an an annual clinical breast exam, and perform monthly breast self-exams.  Thus, Ms. Tagle's daughters should begin annual mammogram screening at the age of 36, based on current guidelines.  Women in this family should also have a gynecological exam as recommended by their primary  provider. All family members should have a colonoscopy by age 23.  Ms. Egge's maternal first cousin, Zigmund Daniel, who was diagnosed with breast cancer in her early 82s, reportedly has also had  negative genetic testing within the last two years.  Thus, testing of additional affected relatives, such as Ms. Wethington's maternal uncles who have had prostate cancer or Ms. Vernet's mother who had breast cancer at 33, is an option, but would, in high likelihood, also return negative genetic test results.     FOLLOW-UP: Lastly, we discussed with Ms. Donaldson that cancer genetics is a rapidly advancing field and it is possible that new genetic tests will be appropriate for her and/or her family members in the future. We encouraged her to remain in contact with cancer genetics on an annual basis so we can update her personal and family histories and let her know of advances in cancer genetics that may benefit this family.   Our contact number was provided. Ms. Shuttleworth's questions were answered to her satisfaction, and she knows she is welcome to call us at anytime with additional questions or concerns.   Jeanine Luz, MS Genetic Counselor [email or web address] Phone: [phone]

## 2016-01-02 ENCOUNTER — Other Ambulatory Visit: Payer: Self-pay | Admitting: General Surgery

## 2016-01-02 DIAGNOSIS — C50411 Malignant neoplasm of upper-outer quadrant of right female breast: Secondary | ICD-10-CM

## 2016-01-19 ENCOUNTER — Other Ambulatory Visit: Payer: Self-pay | Admitting: *Deleted

## 2016-01-21 ENCOUNTER — Telehealth: Payer: Self-pay | Admitting: Hematology and Oncology

## 2016-01-21 NOTE — Telephone Encounter (Signed)
Spoke with patient and confirmed appt date/time per 2/13 pof

## 2016-01-23 ENCOUNTER — Ambulatory Visit: Payer: Self-pay | Admitting: Physician Assistant

## 2016-01-27 ENCOUNTER — Ambulatory Visit: Payer: Self-pay | Admitting: Physician Assistant

## 2016-01-27 ENCOUNTER — Other Ambulatory Visit (HOSPITAL_COMMUNITY): Payer: Self-pay | Admitting: *Deleted

## 2016-01-27 NOTE — Pre-Procedure Instructions (Signed)
    [facility]  01/27/2016      [id number] [address], [address] [facility] [place] [address] Phone: [phone] Fax: [phone]    Your procedure is scheduled on Thursday, February 05, 2016 at 7:30 AM.   Report to [facility] Entrance "A" Admitting Office at 5:30 AM.   Call this number if you have problems the morning of surgery: [phone]   Any questions prior to day of surgery, please call [phone] between 8 & 4 PM.   Remember:  Do not eat food or drink liquids after midnight Wednesday, 02/04/16.  Stop Aspirin products (Excedrin Migraine, Goody's powder, etc) 7 days prior to surgery.   Do not wear jewelry, make-up or nail polish.  Do not wear lotions, powders, or perfumes.  You may wear deodorant.  Do not shave 48 hours prior to surgery.    Do not bring valuables to the hospital.  [facility] is not responsible for any belongings or valuables.  Contacts, dentures or bridgework may not be worn into surgery.  Leave your suitcase in the car.  After surgery it may be brought to your room.  For patients admitted to the hospital, discharge time will be determined by your treatment team.  Special instructions:  See "Preparing for Surgery" Instruction sheet.  Please read over the following fact sheets that you were given. Pain Booklet, Coughing and Deep Breathing and Surgical Site Infection Prevention

## 2016-01-28 ENCOUNTER — Encounter (HOSPITAL_COMMUNITY): Payer: Self-pay

## 2016-01-28 ENCOUNTER — Encounter (HOSPITAL_COMMUNITY)
Admission: RE | Admit: 2016-01-28 | Discharge: 2016-01-28 | Disposition: A | Discharge status: Home / Self Care | Payer: BLUE CROSS/BLUE SHIELD | Source: Ambulatory Visit | Attending: General Surgery | Admitting: General Surgery

## 2016-01-28 DIAGNOSIS — C50411 Malignant neoplasm of upper-outer quadrant of right female breast: Secondary | ICD-10-CM | POA: Diagnosis not present

## 2016-01-28 DIAGNOSIS — Z01812 Encounter for preprocedural laboratory examination: Secondary | ICD-10-CM | POA: Diagnosis present

## 2016-01-28 HISTORY — DX: Headache, unspecified: R51.9

## 2016-01-28 HISTORY — DX: Headache: R51

## 2016-01-28 LAB — COMPREHENSIVE METABOLIC PANEL
ALT: 14 U/L (ref 14–54)
ANION GAP: 13 (ref 5–15)
AST: 21 U/L (ref 15–41)
Albumin: 3.9 g/dL (ref 3.5–5.0)
Alkaline Phosphatase: 57 U/L (ref 38–126)
BUN: 12 mg/dL (ref 6–20)
CHLORIDE: 103 mmol/L (ref 101–111)
CO2: 26 mmol/L (ref 22–32)
Calcium: 9.7 mg/dL (ref 8.9–10.3)
Creatinine, Ser: 0.83 mg/dL (ref 0.44–1.00)
Glucose, Bld: 71 mg/dL (ref 65–99)
POTASSIUM: 4.1 mmol/L (ref 3.5–5.1)
Sodium: 142 mmol/L (ref 135–145)
TOTAL PROTEIN: 7 g/dL (ref 6.5–8.1)
Total Bilirubin: 0.2 mg/dL — ABNORMAL LOW (ref 0.3–1.2)

## 2016-01-28 LAB — CBC WITH DIFFERENTIAL/PLATELET
BASOS ABS: 0 10*3/uL (ref 0.0–0.1)
Basophils Relative: 0 %
EOS PCT: 2 %
Eosinophils Absolute: 0.1 10*3/uL (ref 0.0–0.7)
HCT: 40 % (ref 36.0–46.0)
Hemoglobin: 12.9 g/dL (ref 12.0–15.0)
LYMPHS PCT: 40 %
Lymphs Abs: 1.8 10*3/uL (ref 0.7–4.0)
MCH: 30 pg (ref 26.0–34.0)
MCHC: 32.3 g/dL (ref 30.0–36.0)
MCV: 93 fL (ref 78.0–100.0)
MONO ABS: 0.2 10*3/uL (ref 0.1–1.0)
MONOS PCT: 4 %
Neutro Abs: 2.5 10*3/uL (ref 1.7–7.7)
Neutrophils Relative %: 54 %
PLATELETS: 257 10*3/uL (ref 150–400)
RBC: 4.3 MIL/uL (ref 3.87–5.11)
RDW: 13.1 % (ref 11.5–15.5)
WBC: 4.6 10*3/uL (ref 4.0–10.5)

## 2016-02-04 MED ORDER — CEFAZOLIN SODIUM-DEXTROSE 2-3 GM-% IV SOLR
2.0000 g | INTRAVENOUS | Status: AC
  Administered 2016-02-05 (×2): 2 g via INTRAVENOUS
  Filled 2016-02-04: qty 50

## 2016-02-04 NOTE — H&P (Signed)
Kiara Spencer. Ehle  Location: [facility] Patient #: [id number] DOB: [birth year] Married / Language: English / Race: Black or African American Female      History of Present Illness        The patient is a 45 year old female who presents with breast cancer. 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 I saw her in the [facility] on December 10, 2015 with Dr. Lindi Adie and Dr. Lisbeth Renshaw. Dr. Ronita Hipps is her gynecologist.       She presented with a palpable mass high in the upper outer right breast and imaging studies showed 2 suspicious groups of calcifications in the right breast upper outer quadrant. The first area is a 2.5 cm area of very suspicious calcifications high in the far posterior right upper outer quadrant and there was a second 1.2 cm area of calcifications at middle depth in the right upper outer quadrant. 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 The second area in the upper outer quadrant, somewhat more anterior reveals complex sclerosing lesion. Consensus is that both of these areas need to be excised. Marker clips are 5.8 cm apart and the total area that needs to be excised is probably 8 cm.      She had genetic counseling and testing and that was negative. Her mother had breast cancer. A maternal aunt died of stage IV breast cancer. A maternal cousin had breast cancer.      Subsequent MRI shows a 3.2 cm area high in the upper outer quadrant consistent with biopsy-proven cancer. There is a 1.9 cm complex sclerosing lesion in the lateral right breast. Otherwise no abnormalities of the right or left breast and no adenopathy.       We've had long discussions. She has seen Dr. Marla Roe. We have  talked about extensive quadrantectomy and symmetry procedures, sentinel node biopsy, standard mastectomy with or without reconstruction, nipple sparing mastectomy. After a lengthy discussion both with me and plastic surgery she is still motivated to have nipple sparing mastectomy. Bilateral. Today I have described the inframammary incisions, the approach to the axilla possibly with a second incision or possibly not. I discussed the risk of skin and nipple areolar complex ischemia. She knows that the nipple in the lower breast skin will be insensate. She noted she may lose her nipple if the core biopsy shows cancer that seems less likely considering the anatomy of her cancer.      She will be scheduled with bilateral nipple sparing mastectomy, right axillary sentinel node biopsy. I discussed the indications, details, techniques, and numerous risk of the surgery with her as described above. She understands all these issues. All her questions were answered. We are going to go ahead and begin scheduling and coordinate with Dr. Marla Roe.   Allergies  No Known Drug Allergies01/11/2016  Medication History  Excedrin Tension Headache (500-65MG Tablet, Oral) Active. Medications Reconciled  Vitals  Weight: 150 lb Height: 61in Body Surface Area: 1.67 m Body Mass Index: 28.34 kg/m  Temp.: 97.79F  Pulse: 93 (Regular)  BP: 110/70 (Sitting, Left Arm, Standard)    Physical Exam  General Mental Status-Alert. General Appearance-Not in acute distress. Build & Nutrition-Well nourished. Posture-Normal posture. Gait-Normal.  Head and Neck  Head-normocephalic, atraumatic with no lesions or palpable masses. Trachea-midline. Thyroid Gland Characteristics - normal size and consistency and no palpable nodules.  Chest and Lung Exam Chest and lung exam reveals -on auscultation, normal breath sounds, no adventitious sounds and normal vocal resonance.  Breast Note:  Right breast are medium size. Bra size 34B. 3 cm area of palpable density in the upper outer quadrant but no significant hematoma. A little bit tender. This is up near the lateral border of the pectoralis major muscle. I do not feel the second lesion. The left breast exam is unremarkable. I don't really feel any true axillary adenopathy.   Cardiovascular Cardiovascular examination reveals -normal heart sounds, regular rate and rhythm with no murmurs and femoral artery auscultation bilaterally reveals normal pulses, no bruits, no thrills.  Abdomen Inspection Inspection of the abdomen reveals - No Hernias. Palpation/Percussion Palpation and Percussion of the abdomen reveal - Soft, Non Tender, No Rigidity (guarding), No hepatosplenomegaly and No Palpable abdominal masses.  Neurologic Neurologic evaluation reveals -alert and oriented x 3 with no impairment of recent or remote memory, normal attention span and ability to concentrate, normal sensation and normal coordination.  Musculoskeletal Normal Exam - Bilateral-Upper Extremity Strength Normal and Lower Extremity Strength Normal.    Assessment & Plan  BREAST CANCER OF UPPER-OUTER QUADRANT OF RIGHT FEMALE BREAST (C50.411)       Your evaluation shows 2 abnormal areas in the upper outer quadrant of the right breast, one of these shows a breast cancer and one of these is suspicious but not proven to be cancer. Both need to be removed the left breast looks fine and the lymph nodes looked normal.      You have been evaluated by me, Dr. Marla Roe, Dr. Lindi Adie, and Dr. Lisbeth Renshaw.      Your genetic testing is negative, but you have a strong family history for breast cancer.      We have discussed all the options of standard mastectomy, nipple sparing mastectomy, sentinel node biopsy, aggressive quadrantectomy, all reconstructive options, possible chemotherapy, possible radiation therapy. Decisions regarding the use of chemotherapy will have to  wait until we do the surgery and see whether there is cancer in the nodes and see what the Oncotype testing      We have talked about all the surgical options and you have decided that you would like to proceed with bilateral nipple sparing mastectomy and right axillary sentinel node biopsy. This is a very appropriate decision and your case. Dr. Marla Roe and will perform this surgery in the near future      We have discussed the technical details and risks of this procedure with you and your husband.  FAMILY HISTORY OF BREAST CANCER (Z80.3)   Edsel Petrin. Dalbert Batman, M.D., [facility] General and Minimally invasive Surgery Breast and Colorectal Surgery Office:   [phone] Pager:   [phone]

## 2016-02-05 ENCOUNTER — Encounter (HOSPITAL_COMMUNITY)
Admission: AD | Disposition: A | Discharge status: Home / Self Care | Payer: Self-pay | Source: Ambulatory Visit | Attending: Plastic Surgery

## 2016-02-05 ENCOUNTER — Ambulatory Visit (HOSPITAL_COMMUNITY): Payer: BLUE CROSS/BLUE SHIELD | Admitting: Certified Registered Nurse Anesthetist

## 2016-02-05 ENCOUNTER — Inpatient Hospital Stay (HOSPITAL_COMMUNITY)
Admission: RE | Admit: 2016-02-05 | Discharge: 2016-02-08 | DRG: 580 | Disposition: A | Discharge status: Home / Self Care | Payer: BLUE CROSS/BLUE SHIELD | Source: Ambulatory Visit | Attending: Plastic Surgery | Admitting: Plastic Surgery

## 2016-02-05 ENCOUNTER — Encounter (HOSPITAL_COMMUNITY): Payer: Self-pay | Admitting: *Deleted

## 2016-02-05 ENCOUNTER — Ambulatory Visit (HOSPITAL_COMMUNITY)
Admission: RE | Admit: 2016-02-05 | Discharge: 2016-02-05 | Disposition: A | Discharge status: Home / Self Care | Payer: BLUE CROSS/BLUE SHIELD | Source: Ambulatory Visit | Attending: General Surgery | Admitting: General Surgery

## 2016-02-05 DIAGNOSIS — C773 Secondary and unspecified malignant neoplasm of axilla and upper limb lymph nodes: Secondary | ICD-10-CM | POA: Diagnosis present

## 2016-02-05 DIAGNOSIS — Z170 Estrogen receptor positive status [ER+]: Secondary | ICD-10-CM

## 2016-02-05 DIAGNOSIS — C50411 Malignant neoplasm of upper-outer quadrant of right female breast: Secondary | ICD-10-CM | POA: Diagnosis present

## 2016-02-05 DIAGNOSIS — Z803 Family history of malignant neoplasm of breast: Secondary | ICD-10-CM | POA: Diagnosis not present

## 2016-02-05 DIAGNOSIS — C50919 Malignant neoplasm of unspecified site of unspecified female breast: Secondary | ICD-10-CM | POA: Diagnosis present

## 2016-02-05 HISTORY — PX: MASTECTOMY, PARTIAL: SHX709

## 2016-02-05 HISTORY — PX: MASTECTOMY W/ SENTINEL NODE BIOPSY: SHX2001

## 2016-02-05 HISTORY — PX: BREAST RECONSTRUCTION WITH PLACEMENT OF TISSUE EXPANDER AND FLEX HD (ACELLULAR HYDRATED DERMIS): SHX6295

## 2016-02-05 LAB — CBC
HEMATOCRIT: 30.3 % — AB (ref 36.0–46.0)
HEMOGLOBIN: 9.5 g/dL — AB (ref 12.0–15.0)
MCH: 29 pg (ref 26.0–34.0)
MCHC: 31.4 g/dL (ref 30.0–36.0)
MCV: 92.4 fL (ref 78.0–100.0)
Platelets: 265 10*3/uL (ref 150–400)
RBC: 3.28 MIL/uL — ABNORMAL LOW (ref 3.87–5.11)
RDW: 13.1 % (ref 11.5–15.5)
WBC: 9 10*3/uL (ref 4.0–10.5)

## 2016-02-05 LAB — BASIC METABOLIC PANEL
ANION GAP: 9 (ref 5–15)
BUN: 10 mg/dL (ref 6–20)
CHLORIDE: 106 mmol/L (ref 101–111)
CO2: 25 mmol/L (ref 22–32)
Calcium: 8.8 mg/dL — ABNORMAL LOW (ref 8.9–10.3)
Creatinine, Ser: 0.9 mg/dL (ref 0.44–1.00)
GLUCOSE: 192 mg/dL — AB (ref 65–99)
POTASSIUM: 3.6 mmol/L (ref 3.5–5.1)
SODIUM: 140 mmol/L (ref 135–145)

## 2016-02-05 SURGERY — MASTECTOMY WITH SENTINEL LYMPH NODE BIOPSY
Anesthesia: General | Site: Chest | Laterality: Right

## 2016-02-05 MED ORDER — DEXAMETHASONE SODIUM PHOSPHATE 10 MG/ML IJ SOLN
INTRAMUSCULAR | Status: DC | PRN
  Administered 2016-02-05: 10 mg via INTRAVENOUS

## 2016-02-05 MED ORDER — NEOSTIGMINE METHYLSULFATE 10 MG/10ML IV SOLN
INTRAVENOUS | Status: DC | PRN
  Administered 2016-02-05: 1 mg via INTRAVENOUS

## 2016-02-05 MED ORDER — ALBUMIN HUMAN 5 % IV SOLN
INTRAVENOUS | Status: DC | PRN
  Administered 2016-02-05: 11:00:00 via INTRAVENOUS

## 2016-02-05 MED ORDER — PROMETHAZINE HCL 25 MG/ML IJ SOLN: 6.2500 mg | INTRAMUSCULAR | Status: DC | PRN

## 2016-02-05 MED ORDER — ONDANSETRON HCL 4 MG/2ML IJ SOLN
INTRAMUSCULAR | Status: DC | PRN
  Administered 2016-02-05: 4 mg via INTRAVENOUS

## 2016-02-05 MED ORDER — EPHEDRINE SULFATE 50 MG/ML IJ SOLN
INTRAMUSCULAR | Status: DC | PRN
  Administered 2016-02-05 (×2): 10 mg via INTRAVENOUS

## 2016-02-05 MED ORDER — DIPHENHYDRAMINE HCL 12.5 MG/5ML PO ELIX
12.5000 mg | ORAL_SOLUTION | Freq: Four times a day (QID) | ORAL | Status: DC | PRN
Start: 2016-02-05 — End: 2016-02-08
  Administered 2016-02-06: 12.5 mg via ORAL
  Filled 2016-02-05: qty 10

## 2016-02-05 MED ORDER — MIDAZOLAM HCL 5 MG/5ML IJ SOLN
INTRAMUSCULAR | Status: DC | PRN
  Administered 2016-02-05: 2 mg via INTRAVENOUS

## 2016-02-05 MED ORDER — ONDANSETRON 4 MG PO TBDP: 4.0000 mg | ORAL_TABLET | Freq: Four times a day (QID) | ORAL | Status: DC | PRN

## 2016-02-05 MED ORDER — PHENYLEPHRINE HCL 10 MG/ML IJ SOLN
INTRAMUSCULAR | Status: DC | PRN
  Administered 2016-02-05: 40 ug via INTRAVENOUS
  Administered 2016-02-05: 80 ug via INTRAVENOUS
  Administered 2016-02-05 (×4): 40 ug via INTRAVENOUS

## 2016-02-05 MED ORDER — HYDROCODONE-ACETAMINOPHEN 5-325 MG PO TABS
1.0000 | ORAL_TABLET | ORAL | Status: DC | PRN
  Administered 2016-02-05 – 2016-02-07 (×3): 2 via ORAL
  Filled 2016-02-05 (×2): qty 2

## 2016-02-05 MED ORDER — ONDANSETRON HCL 4 MG/2ML IJ SOLN
INTRAMUSCULAR | Status: AC
  Filled 2016-02-05: qty 2

## 2016-02-05 MED ORDER — DEXAMETHASONE SODIUM PHOSPHATE 10 MG/ML IJ SOLN
INTRAMUSCULAR | Status: AC
  Filled 2016-02-05: qty 1

## 2016-02-05 MED ORDER — DIAZEPAM 2 MG PO TABS
2.0000 mg | ORAL_TABLET | Freq: Two times a day (BID) | ORAL | Status: DC
  Administered 2016-02-05 – 2016-02-08 (×7): 2 mg via ORAL
  Filled 2016-02-05 (×7): qty 1

## 2016-02-05 MED ORDER — 0.9 % SODIUM CHLORIDE (POUR BTL) OPTIME
TOPICAL | Status: DC | PRN
  Administered 2016-02-05 (×3): 1000 mL

## 2016-02-05 MED ORDER — GLYCOPYRROLATE 0.2 MG/ML IJ SOLN
INTRAMUSCULAR | Status: AC
  Filled 2016-02-05: qty 3

## 2016-02-05 MED ORDER — FENTANYL CITRATE (PF) 100 MCG/2ML IJ SOLN
INTRAMUSCULAR | Status: DC | PRN
  Administered 2016-02-05: 50 ug via INTRAVENOUS
  Administered 2016-02-05 (×2): 100 ug via INTRAVENOUS
  Administered 2016-02-05 (×4): 50 ug via INTRAVENOUS
  Administered 2016-02-05: 100 ug via INTRAVENOUS

## 2016-02-05 MED ORDER — MORPHINE SULFATE (PF) 2 MG/ML IV SOLN
2.0000 mg | INTRAVENOUS | Status: DC | PRN
  Administered 2016-02-05: 2 mg via INTRAVENOUS

## 2016-02-05 MED ORDER — HYDROMORPHONE HCL 1 MG/ML IJ SOLN
INTRAMUSCULAR | Status: AC
  Filled 2016-02-05: qty 1

## 2016-02-05 MED ORDER — LIDOCAINE HCL (CARDIAC) 20 MG/ML IV SOLN
INTRAVENOUS | Status: DC | PRN
  Administered 2016-02-05: 30 mg via INTRAVENOUS

## 2016-02-05 MED ORDER — TECHNETIUM TC 99M SULFUR COLLOID FILTERED
1.0000 | Freq: Once | INTRAVENOUS | Status: AC | PRN
  Administered 2016-02-05: 1 via INTRADERMAL

## 2016-02-05 MED ORDER — DIPHENHYDRAMINE HCL 50 MG/ML IJ SOLN: 12.5000 mg | Freq: Four times a day (QID) | INTRAMUSCULAR | Status: DC | PRN

## 2016-02-05 MED ORDER — FENTANYL CITRATE (PF) 250 MCG/5ML IJ SOLN
INTRAMUSCULAR | Status: AC
  Filled 2016-02-05: qty 5

## 2016-02-05 MED ORDER — ACETAMINOPHEN 500 MG PO TABS
1000.0000 mg | ORAL_TABLET | Freq: Four times a day (QID) | ORAL | Status: DC
  Administered 2016-02-05 – 2016-02-08 (×8): 1000 mg via ORAL
  Filled 2016-02-05 (×8): qty 2

## 2016-02-05 MED ORDER — SODIUM CHLORIDE 0.9 % IR SOLN
Status: DC | PRN
  Administered 2016-02-05: 500 mL

## 2016-02-05 MED ORDER — NAPROXEN 250 MG PO TABS: 500.0000 mg | ORAL_TABLET | Freq: Two times a day (BID) | ORAL | Status: DC | PRN

## 2016-02-05 MED ORDER — SODIUM CHLORIDE 0.9 % IR SOLN
Status: DC | PRN
  Administered 2016-02-05: 1000 mL

## 2016-02-05 MED ORDER — LIDOCAINE HCL (CARDIAC) 20 MG/ML IV SOLN
INTRAVENOUS | Status: AC
  Filled 2016-02-05: qty 5

## 2016-02-05 MED ORDER — INFLUENZA VAC SPLIT QUAD 0.5 ML IM SUSY
0.5000 mL | PREFILLED_SYRINGE | INTRAMUSCULAR | Status: AC
  Administered 2016-02-06: 0.5 mL via INTRAMUSCULAR
  Filled 2016-02-05: qty 0.5

## 2016-02-05 MED ORDER — PROPOFOL 10 MG/ML IV BOLUS
INTRAVENOUS | Status: AC
  Filled 2016-02-05: qty 20

## 2016-02-05 MED ORDER — ROCURONIUM BROMIDE 50 MG/5ML IV SOLN
INTRAVENOUS | Status: AC
  Filled 2016-02-05: qty 1

## 2016-02-05 MED ORDER — LACTATED RINGERS IV SOLN
INTRAVENOUS | Status: DC | PRN
  Administered 2016-02-05 (×2): via INTRAVENOUS

## 2016-02-05 MED ORDER — ONDANSETRON HCL 4 MG/2ML IJ SOLN
4.0000 mg | Freq: Four times a day (QID) | INTRAMUSCULAR | Status: DC | PRN
  Administered 2016-02-05 – 2016-02-07 (×2): 4 mg via INTRAVENOUS
  Filled 2016-02-05 (×3): qty 2

## 2016-02-05 MED ORDER — DEXAMETHASONE SODIUM PHOSPHATE 4 MG/ML IJ SOLN
INTRAMUSCULAR | Status: AC
  Filled 2016-02-05: qty 2

## 2016-02-05 MED ORDER — SODIUM CHLORIDE 0.9 % IJ SOLN
INTRAVENOUS | Status: DC | PRN
  Administered 2016-02-05: 5 mL

## 2016-02-05 MED ORDER — CEFAZOLIN SODIUM-DEXTROSE 2-3 GM-% IV SOLR
2.0000 g | Freq: Three times a day (TID) | INTRAVENOUS | Status: DC
  Administered 2016-02-05 – 2016-02-08 (×8): 2 g via INTRAVENOUS
  Filled 2016-02-05 (×12): qty 50

## 2016-02-05 MED ORDER — SODIUM CHLORIDE 0.9 % IJ SOLN
INTRAMUSCULAR | Status: AC
  Filled 2016-02-05: qty 10

## 2016-02-05 MED ORDER — CHLORHEXIDINE GLUCONATE 4 % EX LIQD: 1.0000 "application " | Freq: Once | CUTANEOUS | Status: DC

## 2016-02-05 MED ORDER — HYDROMORPHONE HCL 1 MG/ML IJ SOLN
0.2500 mg | INTRAMUSCULAR | Status: DC | PRN
  Administered 2016-02-05 (×2): 0.5 mg via INTRAVENOUS

## 2016-02-05 MED ORDER — HYDROCODONE-ACETAMINOPHEN 5-325 MG PO TABS
ORAL_TABLET | ORAL | Status: AC
  Filled 2016-02-05: qty 2

## 2016-02-05 MED ORDER — KCL IN DEXTROSE-NACL 20-5-0.45 MEQ/L-%-% IV SOLN
INTRAVENOUS | Status: DC
  Administered 2016-02-05 – 2016-02-06 (×2): via INTRAVENOUS
  Filled 2016-02-05 (×3): qty 1000

## 2016-02-05 MED ORDER — PROPOFOL 10 MG/ML IV BOLUS
INTRAVENOUS | Status: DC | PRN
  Administered 2016-02-05: 200 mg via INTRAVENOUS
  Administered 2016-02-05: 50 mg via INTRAVENOUS

## 2016-02-05 MED ORDER — MIDAZOLAM HCL 2 MG/2ML IJ SOLN
INTRAMUSCULAR | Status: AC
  Filled 2016-02-05: qty 2

## 2016-02-05 MED ORDER — OXYCODONE HCL ER 10 MG PO T12A
10.0000 mg | EXTENDED_RELEASE_TABLET | Freq: Two times a day (BID) | ORAL | Status: DC
  Administered 2016-02-05 – 2016-02-08 (×7): 10 mg via ORAL
  Filled 2016-02-05 (×7): qty 1

## 2016-02-05 MED ORDER — ROCURONIUM BROMIDE 100 MG/10ML IV SOLN
INTRAVENOUS | Status: DC | PRN
  Administered 2016-02-05: 50 mg via INTRAVENOUS
  Administered 2016-02-05 (×2): 10 mg via INTRAVENOUS

## 2016-02-05 MED ORDER — MORPHINE SULFATE (PF) 2 MG/ML IV SOLN
INTRAVENOUS | Status: AC
  Filled 2016-02-05: qty 1

## 2016-02-05 MED ORDER — DOCUSATE SODIUM 100 MG PO CAPS
100.0000 mg | ORAL_CAPSULE | Freq: Two times a day (BID) | ORAL | Status: DC
  Administered 2016-02-05 – 2016-02-08 (×7): 100 mg via ORAL
  Filled 2016-02-05 (×7): qty 1

## 2016-02-05 MED ORDER — DEXAMETHASONE SODIUM PHOSPHATE 4 MG/ML IJ SOLN: INTRAMUSCULAR | Status: DC | PRN

## 2016-02-05 MED ORDER — GLYCOPYRROLATE 0.2 MG/ML IJ SOLN
INTRAMUSCULAR | Status: DC | PRN
  Administered 2016-02-05: 0.2 mg via INTRAVENOUS

## 2016-02-05 MED ORDER — CEFAZOLIN SODIUM-DEXTROSE 2-3 GM-% IV SOLR
INTRAVENOUS | Status: AC
  Filled 2016-02-05: qty 50

## 2016-02-05 MED ORDER — POLYETHYLENE GLYCOL 3350 17 G PO PACK: 17.0000 g | PACK | Freq: Every day | ORAL | Status: DC | PRN

## 2016-02-05 SURGICAL SUPPLY — 99 items
ADH SKN CLS APL DERMABOND .7 (GAUZE/BANDAGES/DRESSINGS) ×6
APPLIER CLIP 9.375 MED OPEN (MISCELLANEOUS) ×5
APR CLP MED 9.3 20 MLT OPN (MISCELLANEOUS) ×3
BAG DECANTER FOR FLEXI CONT (MISCELLANEOUS) ×5 IMPLANT
BINDER BREAST LRG (GAUZE/BANDAGES/DRESSINGS) ×2 IMPLANT
BINDER BREAST XLRG (GAUZE/BANDAGES/DRESSINGS) IMPLANT
BIOPATCH RED 1 DISK 7.0 (GAUZE/BANDAGES/DRESSINGS) ×8 IMPLANT
BIOPATCH RED 1IN DISK 7.0MM (GAUZE/BANDAGES/DRESSINGS) ×2
CANISTER SUCTION 2500CC (MISCELLANEOUS) ×10 IMPLANT
CHLORAPREP W/TINT 26ML (MISCELLANEOUS) ×10 IMPLANT
CLIP APPLIE 9.375 MED OPEN (MISCELLANEOUS) ×3 IMPLANT
CONT SPEC 4OZ CLIKSEAL STRL BL (MISCELLANEOUS) ×11 IMPLANT
COVER MAYO STAND STRL (DRAPES) ×2 IMPLANT
COVER PROBE W GEL 5X96 (DRAPES) ×5 IMPLANT
COVER SURGICAL LIGHT HANDLE (MISCELLANEOUS) ×7 IMPLANT
DERMABOND ADVANCED (GAUZE/BANDAGES/DRESSINGS) ×4
DERMABOND ADVANCED .7 DNX12 (GAUZE/BANDAGES/DRESSINGS) ×6 IMPLANT
DEVICE DISSECT PLASMABLAD 3.0S (MISCELLANEOUS) ×3 IMPLANT
DEVICE DUBIN SPECIMEN MAMMOGRA (MISCELLANEOUS) IMPLANT
DRAIN CHANNEL 19F RND (DRAIN) ×10 IMPLANT
DRAPE CHEST BREAST 15X10 FENES (DRAPES) ×3 IMPLANT
DRAPE ORTHO SPLIT 77X108 STRL (DRAPES) ×10
DRAPE PROXIMA HALF (DRAPES) ×14 IMPLANT
DRAPE SURG 17X23 STRL (DRAPES) ×18 IMPLANT
DRAPE SURG ORHT 6 SPLT 77X108 (DRAPES) ×6 IMPLANT
DRAPE UTILITY XL STRL (DRAPES) ×6 IMPLANT
DRAPE WARM FLUID 44X44 (DRAPE) ×5 IMPLANT
ELECT BLADE 4.0 EZ CLEAN MEGAD (MISCELLANEOUS)
ELECT BLADE 6.5 EXT (BLADE) ×2 IMPLANT
ELECT CAUTERY BLADE 6.4 (BLADE) ×5 IMPLANT
ELECT REM PT RETURN 9FT ADLT (ELECTROSURGICAL) ×10
ELECTRODE BLDE 4.0 EZ CLN MEGD (MISCELLANEOUS) ×6 IMPLANT
ELECTRODE REM PT RTRN 9FT ADLT (ELECTROSURGICAL) ×8 IMPLANT
EVACUATOR SILICONE 100CC (DRAIN) ×10 IMPLANT
GAUZE SPONGE 4X4 12PLY STRL (GAUZE/BANDAGES/DRESSINGS) ×2 IMPLANT
GLOVE BIO SURGEON STRL SZ 6 (GLOVE) ×8 IMPLANT
GLOVE BIO SURGEON STRL SZ 6.5 (GLOVE) ×16 IMPLANT
GLOVE BIO SURGEON STRL SZ7 (GLOVE) ×9 IMPLANT
GLOVE BIO SURGEONS STRL SZ 6.5 (GLOVE) ×7
GLOVE BIOGEL PI IND STRL 6.5 (GLOVE) ×2 IMPLANT
GLOVE BIOGEL PI IND STRL 7.0 (GLOVE) IMPLANT
GLOVE BIOGEL PI INDICATOR 6.5 (GLOVE) ×4
GLOVE BIOGEL PI INDICATOR 7.0 (GLOVE) ×6
GLOVE EUDERMIC 7 POWDERFREE (GLOVE) ×11 IMPLANT
GLOVE SURG SS PI 7.0 STRL IVOR (GLOVE) ×2 IMPLANT
GLOVE SURG SS PI 7.5 STRL IVOR (GLOVE) ×3 IMPLANT
GOWN STRL REUS W/ TWL LRG LVL3 (GOWN DISPOSABLE) ×12 IMPLANT
GOWN STRL REUS W/ TWL XL LVL3 (GOWN DISPOSABLE) ×3 IMPLANT
GOWN STRL REUS W/TWL LRG LVL3 (GOWN DISPOSABLE) ×25
GOWN STRL REUS W/TWL XL LVL3 (GOWN DISPOSABLE) ×10
GRAFT FLEX HD BILAT 4X16 THICK (Tissue Mesh) ×6 IMPLANT
IMPL BREAST TIS EXP M 350CC (Breast) IMPLANT
IMPLANT BREAST TIS EXP M 350CC (Breast) ×10 IMPLANT
KIT BASIN OR (CUSTOM PROCEDURE TRAY) ×10 IMPLANT
KIT MARKER MARGIN INK (KITS) IMPLANT
KIT ROOM TURNOVER OR (KITS) ×8 IMPLANT
LIGHT WAVEGUIDE WIDE FLAT (MISCELLANEOUS) ×3 IMPLANT
NDL 18GX1X1/2 (RX/OR ONLY) (NEEDLE) ×3 IMPLANT
NDL HYPO 25GX1X1/2 BEV (NEEDLE) ×1 IMPLANT
NEEDLE 18GX1X1/2 (RX/OR ONLY) (NEEDLE) ×5 IMPLANT
NEEDLE 22X1 1/2 (OR ONLY) (NEEDLE) ×3 IMPLANT
NEEDLE HYPO 25GX1X1/2 BEV (NEEDLE) ×5 IMPLANT
NS IRRIG 1000ML POUR BTL (IV SOLUTION) ×15 IMPLANT
PACK GENERAL/GYN (CUSTOM PROCEDURE TRAY) ×7 IMPLANT
PACK SURGICAL SETUP 50X90 (CUSTOM PROCEDURE TRAY) ×3 IMPLANT
PAD ABD 8X10 STRL (GAUZE/BANDAGES/DRESSINGS) ×14 IMPLANT
PAD ARMBOARD 7.5X6 YLW CONV (MISCELLANEOUS) ×12 IMPLANT
PENCIL BUTTON HOLSTER BLD 10FT (ELECTRODE) ×2 IMPLANT
PIN SAFETY STERILE (MISCELLANEOUS) ×5 IMPLANT
PLASMABLADE 3.0S (MISCELLANEOUS) ×5
SET ASEPTIC TRANSFER (MISCELLANEOUS) ×4 IMPLANT
SPECIMEN JAR LARGE (MISCELLANEOUS) ×4 IMPLANT
SPECIMEN JAR MEDIUM (MISCELLANEOUS) ×3 IMPLANT
SPECIMEN JAR X LARGE (MISCELLANEOUS) ×3 IMPLANT
SPONGE LAP 18X18 X RAY DECT (DISPOSABLE) ×30 IMPLANT
SPONGE LAP 4X18 X RAY DECT (DISPOSABLE) ×5 IMPLANT
STAPLER VISISTAT 35W (STAPLE) ×3 IMPLANT
SUT ETHILON 3 0 FSL (SUTURE) ×1 IMPLANT
SUT MNCRL AB 4-0 PS2 18 (SUTURE) ×13 IMPLANT
SUT MON AB 3-0 SH 27 (SUTURE) ×15
SUT MON AB 3-0 SH27 (SUTURE) ×7 IMPLANT
SUT MON AB 5-0 PS2 18 (SUTURE) ×11 IMPLANT
SUT PDS AB 2-0 CT1 27 (SUTURE) ×26 IMPLANT
SUT SILK 2 0 FS (SUTURE) ×5 IMPLANT
SUT SILK 2 0 SH (SUTURE) ×5 IMPLANT
SUT SILK 3 0 SH 30 (SUTURE) ×4 IMPLANT
SUT SILK 4 0 PS 2 (SUTURE) ×6 IMPLANT
SUT VIC AB 3-0 SH 18 (SUTURE) ×5 IMPLANT
SUT VIC AB 3-0 SH 27 (SUTURE) ×5
SUT VIC AB 3-0 SH 27XBRD (SUTURE) IMPLANT
SYR BULB 3OZ (MISCELLANEOUS) ×2 IMPLANT
SYR CONTROL 10ML LL (SYRINGE) ×8 IMPLANT
TOWEL OR 17X24 6PK STRL BLUE (TOWEL DISPOSABLE) ×10 IMPLANT
TOWEL OR 17X26 10 PK STRL BLUE (TOWEL DISPOSABLE) ×2 IMPLANT
TRAY FOLEY CATH 14FRSI W/METER (CATHETERS) ×2 IMPLANT
TUBE CONNECTING 12'X1/4 (SUCTIONS) ×2
TUBE CONNECTING 12X1/4 (SUCTIONS) ×6 IMPLANT
WATER STERILE IRR 1000ML POUR (IV SOLUTION) IMPLANT
YANKAUER SUCT BULB TIP NO VENT (SUCTIONS) ×2 IMPLANT

## 2016-02-05 NOTE — Op Note (Signed)
Op report    DATE OF OPERATION:  02/05/2016  LOCATION: [facility]  SURGICAL DIVISION: Plastic Surgery  PREOPERATIVE DIAGNOSES:  1. Breast cancer.    POSTOPERATIVE DIAGNOSES:  1. Breast cancer.   PROCEDURE:  1. Bilateral immediate breast reconstruction with placement of Acellular Dermal Matrix and tissue expanders.  SURGEON: Claire Sanger Dillingham, DO  ASSISTANT: Shawn Rayburn, PA  ANESTHESIA:  General.   COMPLICATIONS: None.   IMPLANTS: Left - Mentor 350 cc. 250 cc of injectable saline placed. Right - Mentor 350cc. 250 cc of injectable saline placed. Acellular Dermal Matrix 4 x 16 cm  INDICATIONS FOR PROCEDURE:  The patient, Kiara Spencer, is a 45 y.o. female born on Apr 14, 1971, is here for  immediate first stage breast reconstruction with placement of bilateral tissue expander and Acellular dermal matrix. MRN: [id number]  CONSENT:  Informed consent was obtained directly from the patient. Risks, benefits and alternatives were fully discussed. Specific risks including but not limited to bleeding, infection, hematoma, seroma, scarring, pain, implant infection, implant extrusion, capsular contracture, asymmetry, wound healing problems, and need for further surgery were all discussed. The patient did have an ample opportunity to have her questions answered to her satisfaction.   DESCRIPTION OF PROCEDURE:  The patient was taken to the operating room by the general surgery team. SCDs were placed and IV antibiotics were given. The patient's chest was prepped and draped in a sterile fashion. A time out was performed and the implants to be used were identified.  Bilateral mastectomies were performed.  Once the general surgery team had completed their portion of the case the patient was rendered to the plastic and reconstructive surgery team.  Left:  The pectoralis major muscle was lifted from the chest wall with release of the lateral edge and lateral inframammary  fold.  The pocket was irrigated with antibiotic solution and hemostasis was achieved with electrocautery.  The ADM was then prepared according to the manufacture guidelines and slits placed to help with postoperative fluid management.  The ADM was then sutured to the inferior and lateral edge of the inframammary fold with 2-0 PDS starting with an interrupted stitch and then a running stitch.  The lateral portion was sutured to with interrupted sutures after the expander was placed.  The expander was prepared according to the manufacture guidelines, the air evacuated and then it was placed under the ADM and pectoralis major muscle.  The inferior and lateral tabs were used to secure the expander to the chest wall with 2-0 PDS.  The drain was placed at the inframammary fold over the ADM and secured to the skin with 3-0 Silk.    The deep layers were closed with 3-0 Vicryl followed by 4-0 Monocryl.  The skin was closed with 5-0 Monocryl and then dermabond was applied.   Right:  The same procedure was done on both sides.  The pectoralis major muscle was lifted from the chest wall with release of the lateral edge and lateral inframammary fold.  The pocket was irrigated with antibiotic solution and hemostasis was achieved with electrocautery.  The ADM was then prepared according to the manufacture guidelines and slits placed to help with postoperative fluid management.  The ADM was then sutured to the inferior and lateral edge of the inframammary fold with 2-0 PDS starting with an interrupted stitch and then a running stitch.  The lateral portion was sutured to with interrupted sutures after the expander was placed.  The expander was prepared according to  the manufacture guidelines, the air evacuated and then it was placed under the ADM and pectoralis major muscle.  The inferior and lateral tabs were used to secure the expander to the chest wall with 2-0 PDS.  The drain was placed at the inframammary fold over the ADM  and secured to the skin with 3-0 Silk.    The deep layers were closed with 3-0 Vicryl followed by 4-0 Monocryl.  The skin was closed with 5-0 Monocryl and then dermabond was applied. The ABDs and breast binder were placed.  The patient tolerated the procedure well and there were no complications.  The patient was allowed to wake from anesthesia and taken to the recovery room in satisfactory condition.

## 2016-02-05 NOTE — Op Note (Addendum)
Patient Name:           Kiara Spencer   Date of Surgery:        02/05/2016  Pre op Diagnosis:      Invasive mammary carcinoma right breast, upper outer quadrant,  Estrogen receptor positive, HER-2 negative, clinical stage T2, N0  Post op Diagnosis:    Invasive mammary carcinoma right breast, upper outer quadrant, estrogen receptor positive, HER-2 negative, with right axillary lymph node metastasis  Procedure:                 Inject blue dye right breast                                      Right nipple sparing mastectomy                                      Right axillary sentinel node biopsy with frozen section                                      Complete right axillary lymph node dissection                                      Left breast prophylactic nipple sparing mastectomy  Surgeon:                     Edsel Petrin. Dalbert Batman, M.D., FACS  Assistant:                      Audelia Hives, M.D.  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 I saw her in the [name] on December 10, 2015 with Dr. Lindi Adie and Dr. Lisbeth Renshaw. Dr. Ronita Hipps is her gynecologist.  She presented with a palpable mass high in the upper outer right breast and imaging studies showed 2 suspicious groups of calcifications in the right breast upper outer quadrant. The first area is a 2.5 cm area of very suspicious calcifications high in the far posterior right upper outer quadrant and there was a second 1.2 cm area of calcifications at middle depth in the right upper outer quadrant. 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 The second area in the upper outer quadrant, somewhat more anterior reveals complex sclerosing lesion.  Consensus is that both of these areas need to be excised. Marker clips are 5.8 cm apart and the total area that needs to be excised is probably 8 cm.  She had genetic counseling and testing and that was negative. Her mother had breast cancer. A maternal aunt died of stage IV breast cancer. A maternal cousin had breast cancer.  Subsequent MRI shows a 3.2 cm area high in the upper outer quadrant consistent with biopsy-proven cancer. There is a 1.9 cm complex sclerosing lesion in the lateral right breast. Otherwise no abnormalities of the right or left  breast and no adenopathy.  We've had long discussions. She has seen Dr. Marla Roe. We have talked about extensive quadrantectomy and symmetry procedures, sentinel node biopsy, standard mastectomy with or without reconstruction, nipple sparing mastectomy. After a lengthy discussion both with me and plastic surgery she is still motivated to have nipple sparing mastectomy. Bilateral.  I have described the inframammary incisions, the approach to the axilla possibly with a second incision or possibly not. I discussed the risk of skin and nipple areolar complex ischemia. She knows that the nipple and the lower breast skin will be insensate. She knows she may lose her nipple if the core biopsy shows cancer; that seems less likely considering the anatomy of her cancer.  She will be scheduled with bilateral nipple sparing mastectomy, right axillary sentinel node biopsy. I discussed the indications, details, techniques, and numerous risk of the surgery with her as described above. She understands all these issues. All her questions were answered.[name]  Operative Findings:       The patient was marked in the standing position in the holding area.  Nuclear medicine injection was performed in the holding area lateral to the nipple.  Blue dye was injected into the lateral right breast staying away from the nipple.  The left breast nipple sparing  mastectomy with prophylactic.  The tissues look normal.  We did mark the breast tissue immediately under the nipple and areola with suture.  We did mark the axillary tail with a long suture.  We did do a core biopsy of the undersurface of the nipple.  In the right breast there was a palpable cancer behind the upper outer quadrant.  We were able to get around this completely.  The subareolar breast tissue was marked with a double suture.  A nipple biopsy was also performed.  The axillary tail was marked with a long suture.  One of the 2 sentinel lymph nodes showed metastatic cancer, and so we proceeded with a right axillary lymph node dissection.  Dr. Marla Roe performed bilateral tissue expander placement with Flex HD.  Procedure in Detail:          Following the induction of general endotracheal anesthesia, a Foley catheter was placed.  Intravenous antibiotics were given.  The patient was prepped and draped in a sterile fashion from neck to abdomen and posterior axillary line to posterior axillary line.  Surgical timeout was performed.  The midline and inframammary folds were again checked.  We started our incisions at the inframammary fold 7 cm lateral to the midline and incisions were about 11 or 12 cm in length.     On the left side we elevated the breast tissue off of the pectoralis major muscle using the plasma blade electrocautery.  We took the dissection all the way up to the clavicle.  The anterior skin flap was then elevated also using the plasma blade and some scissor dissection.  We slowly mobilized the breast both centrally, medially, and laterally.  When we dissected with scissors behind the nipple and areola we marked the breast with with silk suture and we took a core biopsy of the back of the nipple with a knife and sent that for routine histology.  We took the dissection all the way up and then took down the superior pole.  We carried the dissection out to the axillary tail which was dissected  out.  The specimen was removed.  The axillary tail was marked with a silk suture and the specimen sent to  the lab.  Hemostasis was excellent and achieved electrocautery.  The wound was irrigated.  The skin flaps looked good.      We then directed our attention to the right side.  A mirror image inframammary incision was made.  On the right side we dissected the anterior skin flap first using plasma blade and scissor cautery.  We marked the subareolar breast tissue with a double suture.  We did a core biopsy of the base of the nipple and sent that for routine histology.  We then dissected the breast off of the pectoralis major and minor muscles posteriorly.  I Carried the dissection up centrally,  medially and laterally.  The tumor was high in the right breast and very difficult to access.  I chose to make a transverse incision in the right axilla.  I took the dissection down and using the neoprobe I found 2 sentinel lymph nodes.  These were sent to the lab and Dr. Tillman Sers said that one of the 2 sentinel nodes contained metastatic cancer.  I then proceeded with a right axillary lymph node dissection.  The clavipectoral fascia was dissected away from the pectoralis major and minor muscles and I entered the upper axilla.  I took the dissection all the way down to the area just below the axillary vein.  A couple of venous tributaries were controlled metal clips and divided.  Thoracodorsal neurovascular bundle was identified and preserved.  There were a couple of lymph nodes that appeared enlarged.  I cleaned out all level I and level II lymph nodes that I can obtain and sent these for routine histology.  There was no palpable mass in the axilla after this was done.  This wound was irrigated with saline and packed.  Then through both incisions we mobilized the axillary tail and superior pole of the breast getting all the breast tissue out from the subdermal area and off of the pectoralis major and minor muscles.  We  could palpate the cancer in the axillary tail which was palpable preop.  The axillar tale was marked with a long silk suture.  The specimen was sent to the lab.  This wound was irrigated and packed.  At this point the case Dr. Marla Roe assumed control and will dictate her tissue expander placement separately.  At this point in the case the  blood loss was about 300 mL.  Counts correct.  Complications none.           Edsel Petrin. Dalbert Batman, M.D., FACS General and Minimally Invasive Surgery Breast and Colorectal Surgery  02/05/2016 12:08 PM

## 2016-02-05 NOTE — Interval H&P Note (Signed)
History and Physical Interval Note:  02/05/2016 6:38 AM  Kiara Spencer  has presented today for surgery, with the diagnosis of cancer right breast upper outer quadrant  The various methods of treatment have been discussed with the patient and family. After consideration of risks, benefits and other options for treatment, the patient has consented to  Procedure(s): RIGHT NIPPLE SPARING MASTECTOMY WITH SENTINEL LYMPH NODE BIOPSY (Right) LEFT PROPHYLACTIC NIPPLE SPARING MASTECTOMY  (Left) BREAST RECONSTRUCTION WITH PLACEMENT OF TISSUE EXPANDER AND FLEX HD (ACELLULAR HYDRATED DERMIS) (Bilateral) as a surgical intervention .  The patient's history has been reviewed, patient examined, no change in status, stable for surgery.  I have reviewed the patient's chart and labs.  Questions were answered to the patient's satisfaction.     Adin Hector

## 2016-02-05 NOTE — H&P (Signed)
Kiara Spencer is an 45 y.o. female.   Chief Complaint: br5 yrs old bf here for breast reconstruction. She felt a mass in the right breast area. 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 She had genetic counseling and it was negative. She is seeing Drs. Moody and [place]. She has two children and breast feed both for ~ 1 year. She is 5 feet 1 inch tall, weighs 150 pounds and preop bra= 36B. She would like to be a C cup. She has some loss of fullness in the upper poll. She is slightly smaller on the right side. She would like to have bilateral mastectomies. She is not a smoker.  Past Medical History  Diagnosis Date  . Breast cancer of upper-outer quadrant of right female breast ([place]) 12/02/2015  . Breast cancer ([place])   . Headache     hx migraines    Past Surgical History  Procedure Laterality Date  . Cesarean section  x 2  . Dilation and curettage of uterus      Family History  Problem Relation Age of Onset  . Breast cancer Mother 33    s/p partial mastectomy  . Colon polyps Mother     3 polyps total  . Breast cancer Maternal Aunt 64  . Leukemia Maternal Uncle 3  . Heart attack Paternal Aunt   . Heart attack Maternal Grandmother   . Heart attack Maternal Grandfather   . Lupus Maternal Aunt   . Prostate cancer Maternal Uncle 68  . Prostate cancer Maternal Uncle 72  . Prostate cancer Maternal Uncle   . Heart attack Maternal Uncle   . Prostate cancer Maternal Uncle     dx. 29s   . Breast cancer Cousin     dx. early 80s or younger; may have had genetic testing  . Cancer Cousin     "rare" cancer, dx. 25s  . Pancreatic cancer Cousin     dx. 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 Throat cancer Paternal Uncle     dx. early 7s  . Cancer Other     unspecified type   Social History:  reports that she has never smoked. She has never  used smokeless tobacco. She reports that she does not drink alcohol or use illicit drugs.  Allergies: No Known Allergies  Medications Prior to Admission  Medication Sig Dispense Refill  . aspirin-acetaminophen-caffeine (EXCEDRIN MIGRAINE) 250-250-65 MG tablet Take 1 tablet by mouth daily as needed for migraine.     . cephALEXin (KEFLEX) 500 MG capsule Take 500 mg by mouth.    [name] HYDROcodone-acetaminophen (NORCO) 5-325 MG tablet Take by mouth.    [name] aspirin-acetaminophen-caffeine (EXCEDRIN MIGRAINE) 250-250-65 MG tablet Take by mouth.      No results found for this or any previous visit (from the past 48 hour(s)). No results found.  Review of Systems  Constitutional: Negative.   HENT: Negative.   Eyes: Negative.   Respiratory: Negative.   Cardiovascular: Negative.   Gastrointestinal: Negative.   Genitourinary: Negative.   Musculoskeletal: Negative.   Skin: Negative.   Neurological: Negative.   Psychiatric/Behavioral: Negative.     Blood pressure 108/70, pulse 66, temperature 98.6 F (37 C), temperature source Oral, resp. rate 18, weight 68.493 kg (151 lb), SpO2 100 %. Physical Exam  Constitutional: She is oriented to person, place, and time. She appears well-developed  and well-nourished.  HENT:  Head: Normocephalic and atraumatic.  Eyes: Conjunctivae and EOM are normal. Pupils are equal, round, and reactive to light.  Cardiovascular: Normal rate.   Respiratory: Effort normal.  Musculoskeletal: Normal range of motion.  Neurological: She is alert and oriented to person, place, and time.  Skin: Skin is warm.  Psychiatric: She has a normal mood and affect. Her behavior is normal. Judgment and thought content normal.     Assessment/Plan Once all reconstruction options were presented, a focused discussion was had regarding the patient's suitability for each of these procedures. We discussed the complications and risks of bilateral surgery. She still wants to have bilateral  mastectomies with immediate reconstruction using expanders and Acellular dermal matrix. She will have a drain on each side. Second stage reconstruction would be ~ 3 months later with removal of the expander and placement of the implants. She is interested in keeping her NAC if possible.    Wallace Going, DO 02/05/2016, 7:27 AM

## 2016-02-05 NOTE — Anesthesia Preprocedure Evaluation (Addendum)
Anesthesia Evaluation  Patient identified by MRN, date of birth, ID band Patient awake    Reviewed: Allergy & Precautions, NPO status , Patient's Chart, lab work & pertinent test results  History of Anesthesia Complications Negative for: history of anesthetic complications  Airway Mallampati: I  TM Distance: >3 FB Neck ROM: Full    Dental  (+) Teeth Intact   Pulmonary neg pulmonary ROS,    breath sounds clear to auscultation       Cardiovascular negative cardio ROS   Rhythm:Regular Rate:Normal     Neuro/Psych negative neurological ROS  negative psych ROS   GI/Hepatic negative GI ROS, Neg liver ROS,   Endo/Other  negative endocrine ROS  Renal/GU negative Renal ROS  negative genitourinary   Musculoskeletal negative musculoskeletal ROS (+)   Abdominal   Peds negative pediatric ROS (+)  Hematology negative hematology ROS (+)   Anesthesia Other Findings   Reproductive/Obstetrics negative OB ROS                            Anesthesia Physical Anesthesia Plan  ASA: I  Anesthesia Plan: General   Post-op Pain Management:    Induction: Intravenous  Airway Management Planned: Oral ETT  Additional Equipment:   Intra-op Plan:   Post-operative Plan: Extubation in OR  Informed Consent: I have reviewed the patients History and Physical, chart, labs and discussed the procedure including the risks, benefits and alternatives for the proposed anesthesia with the patient or authorized representative who has indicated his/her understanding and acceptance.   Dental advisory given  Plan Discussed with: CRNA and Surgeon  Anesthesia Plan Comments:         Anesthesia Quick Evaluation

## 2016-02-05 NOTE — Anesthesia Procedure Notes (Signed)
Procedure Name: Intubation Date/Time: 02/05/2016 7:47 AM Performed by: Adalberto Ill Pre-anesthesia Checklist: Patient identified, Emergency Drugs available, Suction available, Patient being monitored and Timeout performed Patient Re-evaluated:Patient Re-evaluated prior to inductionOxygen Delivery Method: Circle system utilized Preoxygenation: Pre-oxygenation with 100% oxygen Intubation Type: IV induction Ventilation: Mask ventilation without difficulty Laryngoscope Size: Mac and 3 Grade View: Grade II Tube type: Oral Tube size: 7.0 mm Number of attempts: 1 Placement Confirmation: ETT inserted through vocal cords under direct vision,  positive ETCO2 and breath sounds checked- equal and bilateral Secured at: 21 cm Tube secured with: Tape Dental Injury: Teeth and Oropharynx as per pre-operative assessment

## 2016-02-05 NOTE — Brief Op Note (Signed)
02/05/2016  12:59 PM  PATIENT:  Kiara Spencer  45 y.o. female  PRE-OPERATIVE DIAGNOSIS:  RIGHT BREAST CANCER  POST-OPERATIVE DIAGNOSIS:  RIGHT BREAST CANCER  PROCEDURE:  Procedure(s): RIGHT NIPPLE SPARING MASTECTOMY WITH RIGHT AXILLARY SENTINEL LYMPH NODE BIOPSY (Right) LEFT PROPHYLACTIC NIPPLE SPARING MASTECTOMY  (Left) BILATERAL BREAST RECONSTRUCTION WITH PLACEMENT OF TISSUE EXPANDER AND FLEX HD (ACELLULAR HYDRATED DERMIS) (Bilateral)  SURGEON:  Surgeon(s) and Role: Panel 1:    * [name], MD - Primary    * Loel Lofty Mozell Hardacre, DO - Assisting  Panel 2:    * Loel Lofty Caitlynn Ju, DO - Primary  PHYSICIAN ASSISTANT: Shawn Rayburn, PA  ASSISTANTS: none   ANESTHESIA:   general  EBL:  Total I/O In: 3050 [I.V.:2800; IV Piggyback:250] Out: 1175 [Urine:775; Blood:400]  BLOOD ADMINISTERED:none  DRAINS: (2) Jackson-Pratt drain(s) with closed bulb suction in the breast pocket on each side one placed.   LOCAL MEDICATIONS USED:  NONE  SPECIMEN:  No Specimen  DISPOSITION OF SPECIMEN:  N/A  COUNTS:  YES  TOURNIQUET:  * No tourniquets in log *  DICTATION: .Dragon Dictation  PLAN OF CARE: Admit to inpatient   PATIENT DISPOSITION:  PACU - hemodynamically stable.   Delay start of Pharmacological VTE agent (>24hrs) due to surgical blood loss or risk of bleeding: no

## 2016-02-05 NOTE — Transfer of Care (Signed)
Immediate Anesthesia Transfer of Care Note  Patient: Kiara Spencer  Procedure(s) Performed: Procedure(s): RIGHT NIPPLE SPARING MASTECTOMY WITH RIGHT AXILLARY SENTINEL LYMPH NODE BIOPSY (Right) LEFT PROPHYLACTIC NIPPLE SPARING MASTECTOMY  (Left) BILATERAL BREAST RECONSTRUCTION WITH PLACEMENT OF TISSUE EXPANDER AND FLEX HD (ACELLULAR HYDRATED DERMIS) (Bilateral)  Patient Location: PACU  Anesthesia Type:General  Level of Consciousness: awake, alert , oriented and patient cooperative  Airway & Oxygen Therapy: Patient Spontanous Breathing and Patient connected to nasal cannula oxygen  Post-op Assessment: Report given to RN and Post -op Vital signs reviewed and stable  Post vital signs: Reviewed and stable  Last Vitals:  Filed Vitals:   02/05/16 0623  BP: 108/70  Pulse: 66  Temp: 37 C  Resp: 18    Complications: No apparent anesthesia complications

## 2016-02-06 ENCOUNTER — Encounter (HOSPITAL_COMMUNITY): Payer: Self-pay | Admitting: General Surgery

## 2016-02-06 MED ORDER — SODIUM CHLORIDE 0.45 % IV SOLN
INTRAVENOUS | Status: DC
  Administered 2016-02-06: 15:00:00 via INTRAVENOUS

## 2016-02-06 NOTE — Care Management Note (Signed)
Case Management Note  Patient Details  Name: Kiara Spencer MRN: [id number] Date of Birth: 1971/08/29  Subjective/Objective:                    Action/Plan:   Expected Discharge Date:                  Expected Discharge Plan:  Home/Self Care  In-House Referral:     Discharge planning Services     Post Acute Care Choice:    Choice offered to:     DME Arranged:    DME Agency:     HH Arranged:    [place] Agency:     Status of Service:  In process, will continue to follow  Medicare Important Message Given:    Date Medicare IM Given:    Medicare IM give by:    Date Additional Medicare IM Given:    Additional Medicare Important Message give by:     If discussed at [place] of Stay Meetings, dates discussed:    Additional Comments: Initial UR completed  Marilu Favre, RN 02/06/2016, 10:58 AM

## 2016-02-06 NOTE — Progress Notes (Signed)
1 Day Post-Op  Subjective: Patient reports pain under control overall and she has been getting up to bathroom.  Her IV just infiltrated and had to be removed.  Bilateral breast flaps are ecchymotic, but viable and without hematoma.  JP drains are serosanguinous and moderate drainage.   Objective: Vital signs in last 24 hours: Temp:  [97.9 F (36.6 C)-99.2 F (37.3 C)] 98.7 F (37.1 C) (03/03 0629) Pulse Rate:  [78-106] 101 (03/03 0629) Resp:  [12-18] 18 (03/03 0629) BP: (96-113)/(56-74) 96/58 mmHg (03/03 0629) SpO2:  [97 %-100 %] 99 % (03/03 0629) Weight:  [68.04 kg (150 lb)] 68.04 kg (150 lb) (03/02 1615)    Intake/Output from previous day: 03/02 0701 - 03/03 0700 In: 3290 [P.O.:240; I.V.:2800; IV Piggyback:250] Out: 3029 [Urine:2375; Drains:254; Blood:400] Intake/Output this shift:    General appearance: alert, cooperative and mild distress bilateral breast flaps/NAC are ecchymotic, but viable. No signs of hematoma  Lab Results:   Recent Labs  02/05/16 1559  WBC 9.0  HGB 9.5*  HCT 30.3*  PLT 265   BMET  Recent Labs  02/05/16 1559  NA 140  K 3.6  CL 106  CO2 25  GLUCOSE 192*  BUN 10  CREATININE 0.90  CALCIUM 8.8*   PT/INR No results for input(s): LABPROT, INR in the last 72 hours. ABG No results for input(s): PHART, HCO3 in the last 72 hours.  Invalid input(s): PCO2, PO2  Studies/Results: Nm Sentinel Node Inj-no Rpt (breast)  02/05/2016  CLINICAL DATA: cancer right breast upper outer quadrant Sulfur colloid was injected intradermally by the nuclear medicine technologist for breast cancer sentinel node localization.    Anti-infectives: Anti-infectives    Start     Dose/Rate Route Frequency Ordered Stop   02/05/16 1930  ceFAZolin (ANCEF) IVPB 2 g/50 mL premix     2 g 100 mL/hr over 30 Minutes Intravenous 3 times per day 02/05/16 1530     02/05/16 0841  polymyxin B 500,000 Units, bacitracin 50,000 Units in sodium chloride irrigation 0.9 % 500 mL  irrigation  Status:  Discontinued       As needed 02/05/16 0841 02/05/16 1313   02/05/16 0600  ceFAZolin (ANCEF) IVPB 2 g/50 mL premix     2 g 100 mL/hr over 30 Minutes Intravenous To [facility] 02/04/16 1039 02/05/16 1156      Assessment/Plan: s/p Procedure(s): RIGHT NIPPLE SPARING MASTECTOMY WITH RIGHT AXILLARY SENTINEL LYMPH NODE BIOPSY (Right) LEFT PROPHYLACTIC NIPPLE SPARING MASTECTOMY  (Left) BILATERAL BREAST RECONSTRUCTION WITH PLACEMENT OF TISSUE EXPANDER AND FLEX HD (ACELLULAR HYDRATED DERMIS) (Bilateral) Will continue to watch another day in hospital   Will recheck Hgb in am Have kvo'ed IV fluids, but may need to increase if intake down.  Continue breast binder  LOS: 1 day    Kiara Wombles,PA-C Plastic Surgery [phone]

## 2016-02-06 NOTE — Progress Notes (Signed)
1 Day Post-Op  Subjective: Stable and alert.  Pain control better this morning.  No nausea. No arm complaints. Ambulating to bathroom with assistance.  Voiding well.  Urine output high.  Hemoglobin yesterday afternoon went from 12.9 preop  to -9.5 No active bleeding.  Drainage serosanguineous.  Both drains functioning, total out 254 mL since surgery.  I discussed the fact that she had a positive right axillary node and that we did an axillary lymph node dissection. She understands that Dr. Lindi Adie may advise adjuvant chemotherapy at some point, but that this would need to wait until she had achieved wound healing without infection or necrosis. Final pathology report should be out Monday afternoon or Tuesday at the latest.  I will call her those reports  Objective: Vital signs in last 24 hours: Temp:  [97.9 F (36.6 C)-99.2 F (37.3 C)] 98.7 F (37.1 C) (03/03 0629) Pulse Rate:  [75-106] 101 (03/03 0629) Resp:  [12-18] 18 (03/03 0629) BP: (94-113)/(56-74) 96/58 mmHg (03/03 0629) SpO2:  [97 %-100 %] 99 % (03/03 0629) Weight:  [68.04 kg (150 lb)] 68.04 kg (150 lb) (03/02 1615)    Intake/Output from previous day: 03/02 0701 - 03/03 0700 In: 3290 [P.O.:240; I.V.:2800; IV Piggyback:250] Out: 3029 [Urine:2375; Drains:254; Blood:400] Intake/Output this shift: Total I/O In: 240 [P.O.:240] Out: 1610 [Urine:1400; Drains:210]   EXAM: General appearance: Alert.  Very pleasant as usual.  Soft-spoken.  Husband present.  No distress.  A little unhappy about the positive axillary node. Resp: clear to auscultation bilaterally Breasts:Breast skin, nipple and areolar complexes a little bruised clearly seem viable.  No obvious hematomas.  Lab Results:  Results for orders placed or performed during the hospital encounter of 02/05/16 (from the past 24 hour(s))  Basic metabolic panel     Status: Abnormal   Collection Time: 02/05/16  3:59 PM  Result Value Ref Range   Sodium 140 135 - 145 mmol/L    Potassium 3.6 3.5 - 5.1 mmol/L   Chloride 106 101 - 111 mmol/L   CO2 25 22 - 32 mmol/L   Glucose, Bld 192 (H) 65 - 99 mg/dL   BUN 10 6 - 20 mg/dL   Creatinine, Ser 0.90 0.44 - 1.00 mg/dL   Calcium 8.8 (L) 8.9 - 10.3 mg/dL   GFR calc non Af Amer >60 >60 mL/min   GFR calc Af Amer >60 >60 mL/min   Anion gap 9 5 - 15  CBC     Status: Abnormal   Collection Time: 02/05/16  3:59 PM  Result Value Ref Range   WBC 9.0 4.0 - 10.5 K/uL   RBC 3.28 (L) 3.87 - 5.11 MIL/uL   Hemoglobin 9.5 (L) 12.0 - 15.0 g/dL   HCT 30.3 (L) 36.0 - 46.0 %   MCV 92.4 78.0 - 100.0 fL   MCH 29.0 26.0 - 34.0 pg   MCHC 31.4 30.0 - 36.0 g/dL   RDW 13.1 11.5 - 15.5 %   Platelets 265 150 - 400 K/uL     Studies/Results: Nm Sentinel Node Inj-no Rpt (breast)  02/05/2016  CLINICAL DATA: cancer right breast upper outer quadrant Sulfur colloid was injected intradermally by the nuclear medicine technologist for breast cancer sentinel node localization.    [name] acetaminophen  1,000 mg Oral 4 times per day  .  ceFAZolin (ANCEF) IV  2 g Intravenous 3 times per day  . diazepam  2 mg Oral Q12H  . docusate sodium  100 mg Oral BID  . Influenza vac split  quadrivalent PF  0.5 mL Intramuscular Tomorrow-1000  . oxyCODONE  10 mg Oral Q12H     Assessment/Plan: s/p Procedure(s): RIGHT NIPPLE SPARING MASTECTOMY WITH RIGHT AXILLARY SENTINEL LYMPH NODE BIOPSY LEFT PROPHYLACTIC NIPPLE SPARING MASTECTOMY  BILATERAL BREAST RECONSTRUCTION WITH PLACEMENT OF TISSUE EXPANDER AND FLEX HD (ACELLULAR HYDRATED DERMIS)  POD #1.  Bilateral nipple sparing mastectomy, right axillary lymph node dissection, insertion tissue expanders with Flex HD Stable. Mobilize and advance diet.  Teach drain care. Wound care per Dr. Marla Roe Possible discharge home tomorrow I will call pathology reports to her next Monday or Tuesday.  Patient is aware of positive node status and its implications for future  adjuvant therapies. Would anticipate that early  outpatient follow-up will be with Dr. Marla Roe, and I will see her in 3 weeks. She has appointment with Dr. Lindi Adie sometime within the next month.  @PROBHOSP @  LOS: 1 day    Damarcus Reggio M 02/06/2016  . .prob

## 2016-02-06 NOTE — Progress Notes (Signed)
Administered patient new bag of IV fluids D5% 0.45NaCl with KCl 20 mEq/L. Within 2 minutes patient complained of wrist burning and swelling at site. Stopped IV fluids and removed IV. Administered Benadryl PO. MD present in person to relay message. Will follow up with patient.

## 2016-02-07 LAB — BASIC METABOLIC PANEL
ANION GAP: 8 (ref 5–15)
BUN: 7 mg/dL (ref 6–20)
CALCIUM: 8.7 mg/dL — AB (ref 8.9–10.3)
CHLORIDE: 104 mmol/L (ref 101–111)
CO2: 26 mmol/L (ref 22–32)
Creatinine, Ser: 0.76 mg/dL (ref 0.44–1.00)
GFR calc non Af Amer: >60 mL/min (ref >60)
Glucose, Bld: 118 mg/dL — ABNORMAL HIGH (ref 65–99)
Potassium: 3.9 mmol/L (ref 3.5–5.1)
Sodium: 138 mmol/L (ref 135–145)

## 2016-02-07 LAB — CBC
HEMATOCRIT: 23.2 % — AB (ref 36.0–46.0)
HEMOGLOBIN: 7.6 g/dL — AB (ref 12.0–15.0)
MCH: 30.5 pg (ref 26.0–34.0)
MCHC: 32.8 g/dL (ref 30.0–36.0)
MCV: 93.2 fL (ref 78.0–100.0)
Platelets: 199 10*3/uL (ref 150–400)
RBC: 2.49 MIL/uL — AB (ref 3.87–5.11)
RDW: 13.7 % (ref 11.5–15.5)
WBC: 7.8 10*3/uL (ref 4.0–10.5)

## 2016-02-07 NOTE — Progress Notes (Signed)
Patient ID: Kiara Spencer, female   DOB: [birth year], 45 y.o.   MRN: [id number]     [facility]      [address] [place], [place] [address]    Phone: [phone] FAX: [phone]     Subjective: hgb 12.9-->9.5-->7.6 this AM.  BP soft, but okay.  No significant tachcyardia.  Pt notes feeling tired and sleepy.  Drain #1 120 Drain #2 85. Pain controlled, mobilizing.   Objective:  Vital signs:  Filed Vitals:   02/06/16 0629 02/06/16 1406 02/06/16 2145 02/07/16 0615  BP: 96/58 102/62 93/49 119/59  Pulse: 101 89 98 93  Temp: 98.7 F (37.1 C) 97.9 F (36.6 C) 99.6 F (37.6 C) 99.8 F (37.7 C)  TempSrc: Oral Oral Oral Oral  Resp: '18 18 18 18  ' Height:      Weight:      SpO2: 99% 99% 98% 97%       Intake/Output   Yesterday:  03/03 0701 - 03/04 0700 In: 895.7 [P.O.:780; I.V.:115.7] Out: 1205 [Urine:1000; Drains:205] This shift:     Physical Exam: General: Pt awake/alert/oriented x4 in no acute distress Chest: cta. No chest wall pain w good excursion CV:  Pulses intact.  Regular rhythm MS: Normal AROM mjr joints.  No obvious deformity Abdomen: Soft.  Nondistended.  No evidence of peritonitis.  No incarcerated hernias. Ext:  SCDs BLE.  No mjr edema.  No cyanosis Skin: bilateral drains with serosanguinous output.    Problem List:   Active Problems:   Breast cancer [facility])    Results:   Labs: Results for orders placed or performed during the hospital encounter of 02/05/16 (from the past 48 hour(s))  Basic metabolic panel     Status: Abnormal   Collection Time: 02/05/16  3:59 PM  Result Value Ref Range   Sodium 140 135 - 145 mmol/L   Potassium 3.6 3.5 - 5.1 mmol/L   Chloride 106 101 - 111 mmol/L   CO2 25 22 - 32 mmol/L   Glucose, Bld 192 (H) 65 - 99 mg/dL   BUN 10 6 - 20 mg/dL   Creatinine, Ser 0.90 0.44 - 1.00 mg/dL   Calcium 8.8 (L) 8.9 - 10.3 mg/dL   GFR calc non Af Amer >60 >60 mL/min   GFR calc Af Amer  >60 >60 mL/min    Comment: (NOTE) The eGFR has been calculated using the CKD EPI equation. This calculation has not been validated in all clinical situations. eGFR's persistently <60 mL/min signify possible Chronic Kidney Disease.    Anion gap 9 5 - 15  CBC     Status: Abnormal   Collection Time: 02/05/16  3:59 PM  Result Value Ref Range   WBC 9.0 4.0 - 10.5 K/uL   RBC 3.28 (L) 3.87 - 5.11 MIL/uL   Hemoglobin 9.5 (L) 12.0 - 15.0 g/dL   HCT 30.3 (L) 36.0 - 46.0 %   MCV 92.4 78.0 - 100.0 fL   MCH 29.0 26.0 - 34.0 pg   MCHC 31.4 30.0 - 36.0 g/dL   RDW 13.1 11.5 - 15.5 %   Platelets 265 150 - 400 K/uL  CBC     Status: Abnormal   Collection Time: 02/07/16  5:51 AM  Result Value Ref Range   WBC 7.8 4.0 - 10.5 K/uL   RBC 2.49 (L) 3.87 - 5.11 MIL/uL   Hemoglobin 7.6 (L) 12.0 - 15.0 g/dL   HCT 23.2 (L) 36.0 - 46.0 %  MCV 93.2 78.0 - 100.0 fL   MCH 30.5 26.0 - 34.0 pg   MCHC 32.8 30.0 - 36.0 g/dL   RDW 13.7 11.5 - 15.5 %   Platelets 199 150 - 400 K/uL  Basic metabolic panel     Status: Abnormal   Collection Time: 02/07/16  5:51 AM  Result Value Ref Range   Sodium 138 135 - 145 mmol/L   Potassium 3.9 3.5 - 5.1 mmol/L   Chloride 104 101 - 111 mmol/L   CO2 26 22 - 32 mmol/L   Glucose, Bld 118 (H) 65 - 99 mg/dL   BUN 7 6 - 20 mg/dL   Creatinine, Ser 0.76 0.44 - 1.00 mg/dL   Calcium 8.7 (L) 8.9 - 10.3 mg/dL   GFR calc non Af Amer >60 >60 mL/min   GFR calc Af Amer >60 >60 mL/min    Comment: (NOTE) The eGFR has been calculated using the CKD EPI equation. This calculation has not been validated in all clinical situations. eGFR's persistently <60 mL/min signify possible Chronic Kidney Disease.    Anion gap 8 5 - 15    Imaging / Studies: No results found.  Medications / Allergies:  Scheduled Meds: . acetaminophen  1,000 mg Oral 4 times per day  .  ceFAZolin (ANCEF) IV  2 g Intravenous 3 times per day  . diazepam  2 mg Oral Q12H  . docusate sodium  100 mg Oral BID  .  oxyCODONE  10 mg Oral Q12H   Continuous Infusions: . sodium chloride 10 mL/hr at 02/06/16 1455   PRN Meds:.diphenhydrAMINE **OR** diphenhydrAMINE, HYDROcodone-acetaminophen, morphine injection, naproxen, ondansetron **OR** ondansetron (ZOFRAN) IV, polyethylene glycol  Antibiotics: Anti-infectives    Start     Dose/Rate Route Frequency Ordered Stop   02/05/16 1930  ceFAZolin (ANCEF) IVPB 2 g/50 mL premix     2 g 100 mL/hr over 30 Minutes Intravenous 3 times per day 02/05/16 1530     02/05/16 0841  polymyxin B 500,000 Units, bacitracin 50,000 Units in sodium chloride irrigation 0.9 % 500 mL irrigation  Status:  Discontinued       As needed 02/05/16 0841 02/05/16 1313   02/05/16 0600  ceFAZolin (ANCEF) IVPB 2 g/50 mL premix     2 g 100 mL/hr over 30 Minutes Intravenous To [facility] 02/04/16 1039 02/05/16 1156        Assessment/Plan POD #2. Bilateral nipple sparing mastectomy, right axillary lymph node dissection, insertion tissue expanders with Flex HD -concerned about h&h drop, down 2 grams since 3/2.  Repeat CBC in AM.  -drains look serosanguinous -Dr. Dalbert Batman will call pathology reports to her next Monday or Tuesday. Patient is aware of positive node status and its implications for future adjuvant therapies.   Erby Pian, ANP-BC [facility]  02/07/2016 8:21 AM

## 2016-02-08 LAB — CBC
HCT: 24.2 % — ABNORMAL LOW (ref 36.0–46.0)
Hemoglobin: 7.6 g/dL — ABNORMAL LOW (ref 12.0–15.0)
MCH: 29.2 pg (ref 26.0–34.0)
MCHC: 31.4 g/dL (ref 30.0–36.0)
MCV: 93.1 fL (ref 78.0–100.0)
PLATELETS: 223 10*3/uL (ref 150–400)
RBC: 2.6 MIL/uL — ABNORMAL LOW (ref 3.87–5.11)
RDW: 13.4 % (ref 11.5–15.5)
WBC: 6.6 10*3/uL (ref 4.0–10.5)

## 2016-02-08 MED ORDER — NAPROXEN 500 MG PO TABS: 500.0000 mg | ORAL_TABLET | Freq: Two times a day (BID) | ORAL | Status: DC | PRN

## 2016-02-08 MED ORDER — HYDROCODONE-ACETAMINOPHEN 5-325 MG PO TABS: 1.0000 | ORAL_TABLET | ORAL | Status: DC | PRN

## 2016-02-08 MED ORDER — POLYETHYLENE GLYCOL 3350 17 G PO PACK: 17.0000 g | PACK | Freq: Every day | ORAL | Status: DC | PRN

## 2016-02-08 MED ORDER — DOCUSATE SODIUM 100 MG PO CAPS: 100.0000 mg | ORAL_CAPSULE | Freq: Two times a day (BID) | ORAL | Status: DC

## 2016-02-08 MED ORDER — OXYCODONE HCL ER 10 MG PO T12A: 10.0000 mg | EXTENDED_RELEASE_TABLET | Freq: Two times a day (BID) | ORAL | Status: DC

## 2016-02-08 MED ORDER — ACETAMINOPHEN 500 MG PO TABS: 1000.0000 mg | ORAL_TABLET | Freq: Four times a day (QID) | ORAL | Status: DC

## 2016-02-08 MED ORDER — ONDANSETRON 4 MG PO TBDP: 4.0000 mg | ORAL_TABLET | Freq: Four times a day (QID) | ORAL | Status: DC | PRN

## 2016-02-08 MED ORDER — DIAZEPAM 2 MG PO TABS: 2.0000 mg | ORAL_TABLET | Freq: Two times a day (BID) | ORAL | Status: DC

## 2016-02-08 MED ORDER — HYDROCODONE-ACETAMINOPHEN 5-325 MG PO TABS: 1.0000 | ORAL_TABLET | Freq: Four times a day (QID) | ORAL | Status: DC | PRN

## 2016-02-08 NOTE — Progress Notes (Signed)
Patient ID: Kiara Spencer, female   DOB: 1971-05-16, 45 y.o.   MRN: [id number]     [facility]      [address] [place], [place] [address]    Phone: [phone] FAX: [phone]     Subjective: Feels tired, otherwise  No complaints. Afebrile.  VSS. Drain #1 38/24h Drain #2 106m/24h.   Objective:  Vital signs:  Filed Vitals:   02/07/16 0615 02/07/16 1314 02/07/16 2050 02/08/16 0506  BP: 119/59 132/67 128/63 138/65  Pulse: 93 98 92 90  Temp: 99.8 F (37.7 C) 98.7 F (37.1 C) 98.9 F (37.2 C) 99.1 F (37.3 C)  TempSrc: Oral Oral Oral Oral  Resp: '18 18 19 19  ' Height:      Weight:      SpO2: 97% 98% 99% 99%       Intake/Output   Yesterday:  03/04 0701 - 03/05 0700 In: 480 [P.O.:480] Out: 1028 [Urine:950; Drains:78] This shift:  Total I/O In: -  Out: 510 [Urine:450; Drains:60]   Physical Exam: General: Pt awake/alert/oriented x4 in no acute distress  Skin: incisions are dry, no erythema.  Bilateral drains with serous drainage.    Problem List:   Active Problems:   Breast cancer ([facility]    Results:   Labs: Results for orders placed or performed during the hospital encounter of 02/05/16 (from the past 48 hour(s))  CBC     Status: Abnormal   Collection Time: 02/07/16  5:51 AM  Result Value Ref Range   WBC 7.8 4.0 - 10.5 K/uL   RBC 2.49 (L) 3.87 - 5.11 MIL/uL   Hemoglobin 7.6 (L) 12.0 - 15.0 g/dL   HCT 23.2 (L) 36.0 - 46.0 %   MCV 93.2 78.0 - 100.0 fL   MCH 30.5 26.0 - 34.0 pg   MCHC 32.8 30.0 - 36.0 g/dL   RDW 13.7 11.5 - 15.5 %   Platelets 199 150 - 400 K/uL  Basic metabolic panel     Status: Abnormal   Collection Time: 02/07/16  5:51 AM  Result Value Ref Range   Sodium 138 135 - 145 mmol/L   Potassium 3.9 3.5 - 5.1 mmol/L   Chloride 104 101 - 111 mmol/L   CO2 26 22 - 32 mmol/L   Glucose, Bld 118 (H) 65 - 99 mg/dL   BUN 7 6 - 20 mg/dL   Creatinine, Ser 0.76 0.44 - 1.00 mg/dL   Calcium 8.7 (L)  8.9 - 10.3 mg/dL   GFR calc non Af Amer >60 >60 mL/min   GFR calc Af Amer >60 >60 mL/min    Comment: (NOTE) The eGFR has been calculated using the CKD EPI equation. This calculation has not been validated in all clinical situations. eGFR's persistently <60 mL/min signify possible Chronic Kidney Disease.    Anion gap 8 5 - 15    Imaging / Studies: No results found.  Medications / Allergies:  Scheduled Meds: . acetaminophen  1,000 mg Oral 4 times per day  .  ceFAZolin (ANCEF) IV  2 g Intravenous 3 times per day  . diazepam  2 mg Oral Q12H  . docusate sodium  100 mg Oral BID  . oxyCODONE  10 mg Oral Q12H   Continuous Infusions: . sodium chloride 10 mL/hr at 02/06/16 1455   PRN Meds:.diphenhydrAMINE **OR** diphenhydrAMINE, HYDROcodone-acetaminophen, morphine injection, naproxen, ondansetron **OR** ondansetron (ZOFRAN) IV, polyethylene glycol  Antibiotics: Anti-infectives    Start     Dose/Rate Route  Frequency Ordered Stop   02/05/16 1930  ceFAZolin (ANCEF) IVPB 2 g/50 mL premix     2 g 100 mL/hr over 30 Minutes Intravenous 3 times per day 02/05/16 1530     02/05/16 0841  polymyxin B 500,000 Units, bacitracin 50,000 Units in sodium chloride irrigation 0.9 % 500 mL irrigation  Status:  Discontinued       As needed 02/05/16 0841 02/05/16 1313   02/05/16 0600  ceFAZolin (ANCEF) IVPB 2 g/50 mL premix     2 g 100 mL/hr over 30 Minutes Intravenous To [facility] 02/04/16 1039 02/05/16 1156        Assessment/Plan POD #3. Bilateral nipple sparing mastectomy, right axillary lymph node dissection, insertion tissue expanders with Flex HD -await CBC -drains look serous  -Dr. Dalbert Batman will call pathology reports to her next Monday or Tuesday. Patient is aware of positive node status and its implications for future adjuvant therapies. ID-ancef FEN-no issues VTE prophylaxis-SCDs   Erby Pian, ANP-BC [facility]   02/08/2016 7:48 AM

## 2016-02-10 NOTE — Anesthesia Postprocedure Evaluation (Signed)
Anesthesia Post Note  Patient: [employer]  Procedure(s) Performed: Procedure(s) (LRB): RIGHT NIPPLE SPARING MASTECTOMY WITH RIGHT AXILLARY SENTINEL LYMPH NODE BIOPSY (Right) LEFT PROPHYLACTIC NIPPLE SPARING MASTECTOMY  (Left) BILATERAL BREAST RECONSTRUCTION WITH PLACEMENT OF TISSUE EXPANDER AND FLEX HD (ACELLULAR HYDRATED DERMIS) (Bilateral)  Patient location during evaluation: PACU Anesthesia Type: General Level of consciousness: awake and alert Pain management: pain level controlled Vital Signs Assessment: post-procedure vital signs reviewed and stable Respiratory status: spontaneous breathing, nonlabored ventilation, respiratory function stable and patient connected to nasal cannula oxygen Cardiovascular status: blood pressure returned to baseline and stable Postop Assessment: no signs of nausea or vomiting Anesthetic complications: no    Last Vitals:  Filed Vitals:   02/07/16 2050 02/08/16 0506  BP: 128/63 138/65  Pulse: 92 90  Temp: 37.2 C 37.3 C  Resp: 19 19    Last Pain:  Filed Vitals:   02/08/16 0507  PainSc: Asleep                 Tasheba Henson,JAMES TERRILL

## 2016-02-14 NOTE — Assessment & Plan Note (Signed)
Rt. 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 T2N1 (Stage 2B); Left mastectomy: Fibrocystic ch.  Pathology counseling: I discussed the final pathology report of the patient provided  a copy of this report. I discussed the margins as well as lymph node surgeries. We also discussed the final staging along with previously performed ER/PR and HER-2/neu testing.  Recommendation: 1.Mammoprint testing to determine prognosis and determine benefit to chemo 2. Foll by adjuvant XRT 3. Foll by adj anti-estrogen therapy with Tamoxifen 20 mg daily Patient could be enrolled in [place] clinical trial  RTC based on mammaprint results.

## 2016-02-16 ENCOUNTER — Encounter: Payer: Self-pay | Admitting: Hematology and Oncology

## 2016-02-16 ENCOUNTER — Telehealth: Payer: Self-pay | Admitting: *Deleted

## 2016-02-16 ENCOUNTER — Telehealth: Payer: Self-pay | Admitting: Hematology and Oncology

## 2016-02-16 ENCOUNTER — Ambulatory Visit (HOSPITAL_BASED_OUTPATIENT_CLINIC_OR_DEPARTMENT_OTHER): Payer: BLUE CROSS/BLUE SHIELD | Admitting: Hematology and Oncology

## 2016-02-16 ENCOUNTER — Ambulatory Visit (HOSPITAL_BASED_OUTPATIENT_CLINIC_OR_DEPARTMENT_OTHER): Payer: BLUE CROSS/BLUE SHIELD

## 2016-02-16 VITALS — BP 104/59 | HR 95 | Temp 98.4°F | Resp 18 | Ht 61.5 in | Wt 158.0 lb

## 2016-02-16 DIAGNOSIS — D508 Other iron deficiency anemias: Secondary | ICD-10-CM

## 2016-02-16 DIAGNOSIS — C50411 Malignant neoplasm of upper-outer quadrant of right female breast: Secondary | ICD-10-CM

## 2016-02-16 LAB — COMPREHENSIVE METABOLIC PANEL
ALT: 10 U/L (ref 0–55)
AST: 14 U/L (ref 5–34)
Albumin: 2.7 g/dL — ABNORMAL LOW (ref 3.5–5.0)
Alkaline Phosphatase: 56 U/L (ref 40–150)
Anion Gap: 9 mEq/L (ref 3–11)
BUN: 15.1 mg/dL (ref 7.0–26.0)
CALCIUM: 9.5 mg/dL (ref 8.4–10.4)
CHLORIDE: 103 meq/L (ref 98–109)
CO2: 30 mEq/L — ABNORMAL HIGH (ref 22–29)
Creatinine: 0.8 mg/dL (ref 0.6–1.1)
EGFR: >90 mL/min/{1.73_m2} (ref >90)
Glucose: 145 mg/dl — ABNORMAL HIGH (ref 70–140)
POTASSIUM: 3.8 meq/L (ref 3.5–5.1)
SODIUM: 141 meq/L (ref 136–145)
Total Bilirubin: <0.3 mg/dL (ref 0.20–1.20)
Total Protein: 6.8 g/dL (ref 6.4–8.3)

## 2016-02-16 LAB — CBC & DIFF AND RETIC
BASO%: 0.1 % (ref 0.0–2.0)
Basophils Absolute: 0 10*3/uL (ref 0.0–0.1)
EOS ABS: 0.3 10*3/uL (ref 0.0–0.5)
EOS%: 2.8 % (ref 0.0–7.0)
HEMATOCRIT: 25.3 % — AB (ref 34.8–46.6)
HEMOGLOBIN: 7.9 g/dL — AB (ref 11.6–15.9)
IMMATURE RETIC FRACT: 20.6 % — AB (ref 1.60–10.00)
LYMPH%: 19 % (ref 14.0–49.7)
MCH: 28.4 pg (ref 25.1–34.0)
MCHC: 31.2 g/dL — ABNORMAL LOW (ref 31.5–36.0)
MCV: 91 fL (ref 79.5–101.0)
MONO#: 0.3 10*3/uL (ref 0.1–0.9)
MONO%: 3.5 % (ref 0.0–14.0)
NEUT#: 6.7 10*3/uL — ABNORMAL HIGH (ref 1.5–6.5)
NEUT%: 74.6 % (ref 38.4–76.8)
PLATELETS: 511 10*3/uL — AB (ref 145–400)
RBC: 2.78 10*6/uL — ABNORMAL LOW (ref 3.70–5.45)
RDW: 13 % (ref 11.2–14.5)
RETIC CT ABS: 72.84 10*3/uL (ref 33.70–90.70)
Retic %: 2.62 % — ABNORMAL HIGH (ref 0.70–2.10)
WBC: 9 10*3/uL (ref 3.9–10.3)
lymph#: 1.7 10*3/uL (ref 0.9–3.3)
nRBC: 0 % (ref 0–0)

## 2016-02-16 LAB — IRON AND TIBC
%SAT: 9 % — AB (ref 21–57)
Iron: 26 ug/dL — ABNORMAL LOW (ref 41–142)
TIBC: 301 ug/dL (ref 236–444)
UIBC: 275 ug/dL (ref 120–384)

## 2016-02-16 LAB — FERRITIN: Ferritin: 94 ng/ml (ref 9–269)

## 2016-02-16 NOTE — Telephone Encounter (Signed)
Received order for Mammaprint testing per Dr. Lindi Adie. Requisition sent to [facility]. Pathology notified of request.

## 2016-02-16 NOTE — Telephone Encounter (Signed)
Added lab appt per 3/13 pof

## 2016-02-16 NOTE — Progress Notes (Signed)
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 [facility])   Staging form: Breast, AJCC 7th Edition     Clinical stage from 12/10/2015: Stage IIA (T2, N0, M0) - Unsigned       Staging comments: Staged at breast conference on 1.4.17  SUMMARY OF ONCOLOGIC HISTORY:   Breast cancer of upper-outer quadrant of right female breast ([name])   11/24/2015 Mammogram 2 suspicious groups of calcifications right breast UOQ posteriorly 2.2 x 2.3 x 2.5 cm (by U/S measured 1.2 x 0.8 x 1.2 cm); anteriorly 1.1 x 1.2 x 0.8 cm (not seen by ultrasound)   11/27/2015 Initial Diagnosis Right breast biopsy UOQ Posterior: IDC with DCIS with, LVI present, ER 100%, PR 20%, Ki-67 30%, HER-2 negative; ANTERIOR: Complex sclerosing lesions with calcifications   02/05/2016 Surgery Rt. Mastectomy: IDC Grade 1, 3 cm, 1/11 LN positive,with IG DCIS 0.1 cm margin, Diffuse LVI, cancer at axill tail. T2N1 (Stage 2B); Left mastectomy: Fibrocystic ch      CHIEF COMPLIANT: Follow-up after recent mastectomy  INTERVAL HISTORY: Kiara Spencer is a 45 year old with above-mentioned history of right breast cancer who underwent bilateral mastectomies and is here today to discuss the pathology report. She is recovering fairly well from the surgery. She continues to have pain for which she is taking Percocets. She has the drains as well. She had a rash to antibiotic and the antibiotic was discontinued. She would like to know if she needs to take any more antibiotic. She appears to be tired and pale. She did have one episode of mild dizziness at home.  REVIEW OF SYSTEMS:     Constitutional: Denies fevers, chills or abnormal weight loss, mild dizziness 1 Eyes: Denies blurriness of vision Ears, nose, mouth, throat, and face: Denies mucositis or sore throat Respiratory: Denies cough, dyspnea or wheezes Cardiovascular: Denies palpitation, chest discomfort Gastrointestinal:  Denies nausea, heartburn or change in bowel habits Skin: Denies abnormal skin rashes Lymphatics: Denies new lymphadenopathy or easy bruising Neurological:Denies numbness, tingling or new weaknesses Behavioral/Psych: Mood is stable, no new changes  Extremities: No lower extremity edema Breast: Recent bilateral mastectomies with expander placement All other systems were reviewed with the patient and are negative.  I have reviewed the past medical history, past surgical history, social history and family history with the patient and they are unchanged from previous note.  ALLERGIES:  has No Known Allergies.  MEDICATIONS:  Current Outpatient Prescriptions  Medication Sig Dispense Refill  . acetaminophen (TYLENOL) 500 MG tablet Take 2 tablets (1,000 mg total) by mouth every 6 (six) hours. 30 tablet 0  . cephALEXin (KEFLEX) 500 MG capsule Take 500 mg by mouth.    . diazepam (VALIUM) 2 MG tablet Take 1 tablet (2 mg total) by mouth every 12 (twelve) hours. 30 tablet 0  . docusate sodium (COLACE) 100 MG capsule Take 1 capsule (100 mg total) by mouth 2 (two) times daily. 10 capsule 0  . HYDROcodone-acetaminophen (NORCO) 5-325 MG tablet Take 1-2 tablets by mouth every 6 (six) hours as needed for moderate pain. 40 tablet 0  . HYDROcodone-acetaminophen (NORCO/VICODIN) 5-325 MG tablet Take 1-2 tablets by mouth every 4 (four) hours as needed for moderate pain. 30 tablet 0  .  naproxen (NAPROSYN) 500 MG tablet Take 1 tablet (500 mg total) by mouth 2 (two) times daily as needed for mild pain.    [name] ondansetron (ZOFRAN-ODT) 4 MG disintegrating tablet Take 1 tablet (4 mg total) by mouth every 6 (six) hours as  needed for nausea. 20 tablet 0  . oxyCODONE (OXYCONTIN) 10 mg 12 hr tablet Take 1 tablet (10 mg total) by mouth every 12 (twelve) hours. 60 tablet 0  . polyethylene glycol (MIRALAX / GLYCOLAX) packet Take 17 g by mouth daily as needed for mild constipation. 14 each 0   No current facility-administered medications for this visit.    PHYSICAL EXAMINATION: ECOG PERFORMANCE STATUS: 1 - Symptomatic but completely ambulatory  Filed Vitals:   02/16/16 0823  BP: 104/59  Pulse: 95  Temp: 98.4 F (36.9 C)  Resp: 18   Filed Weights   02/16/16 0823  Weight: 158 lb (71.668 kg)    GENERAL:alert, no distress and comfortable SKIN: skin color, texture, turgor are normal, no rashes or significant lesions EYES: normal, Conjunctiva are pink and non-injected, sclera clear OROPHARYNX:no exudate, no erythema and lips, buccal mucosa, and tongue normal  NECK: supple, thyroid normal size, non-tender, without nodularity LYMPH:  no palpable lymphadenopathy in the cervical, axillary or inguinal LUNGS: clear to auscultation and percussion with normal breathing effort HEART: regular rate & rhythm and no murmurs and no lower extremity edema ABDOMEN:abdomen soft, non-tender and normal bowel sounds MUSCULOSKELETAL:no cyanosis of digits and no clubbing  NEURO: alert & oriented x 3 with fluent speech, no focal motor/sensory deficits EXTREMITIES: No lower extremity edema  LABORATORY DATA:  I have reviewed the data as listed   Chemistry      Component Value Date/Time   NA 141 02/16/2016 0923   NA 138 02/07/2016 0551   K 3.8 02/16/2016 0923   K 3.9 02/07/2016 0551   CL 104 02/07/2016 0551   CO2 30* 02/16/2016 0923   CO2 26 02/07/2016 0551   BUN 15.1 02/16/2016 0923   BUN 7 02/07/2016 0551   CREATININE 0.8 02/16/2016 0923   CREATININE 0.76 02/07/2016 0551      Component Value Date/Time   CALCIUM 9.5 02/16/2016 0923   CALCIUM 8.7* 02/07/2016 0551   ALKPHOS 56 02/16/2016 0923   ALKPHOS 57 01/28/2016  0850   AST 14 02/16/2016 0923   AST 21 01/28/2016 0850   ALT 10 02/16/2016 0923   ALT 14 01/28/2016 0850   BILITOT <0.30 02/16/2016 0923   BILITOT 0.2* 01/28/2016 0850       Lab Results  Component Value Date   WBC 9.0 02/16/2016   HGB 7.9* 02/16/2016   HCT 25.3* 02/16/2016   MCV 91.0 02/16/2016   PLT 511* 02/16/2016   NEUTROABS 6.7* 02/16/2016   ASSESSMENT & PLAN:  Breast cancer of upper-outer quadrant of right female breast ([place]) Rt. 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 T2N1 (Stage 2B); Left mastectomy: Fibrocystic ch.  Pathology counseling: I discussed the final pathology report of the patient provided  a copy of this report. I discussed the margins as well as lymph node surgeries. We also discussed the final staging along with previously performed ER/PR and HER-2/neu testing. The difference from her original pathology to the final pathology was that she had one/11 positive lymph nodes with diffuse lymphovascular invasion. The other concern is regarding the positive posterior margin at the axillary tail.  Recommendation: 1. Presentation of  her case in tumor board to discuss the issue with posterior margin of the axillary tail. 2. Mammoprint testing to determine prognosis and determine benefit to chemo 3. Foll by adjuvant XRT 4. Foll by adj anti-estrogen therapy with Tamoxifen 20 mg daily +/- ovarian suppression  Anemia: Postoperative/ blood loss related, iron deficiency. CBC revealed a hemoglobin of 7.9 , iron studies revealed benign saturation of 9%. I recommend giving intravenous iron therapy. She does have mild symptoms from the anemia with occasional dizziness. But these are not significant to justify transfusion of blood for those symptoms.  Patient could be enrolled in [place] clinical trial once she completes adjuvant radiation.  RTC based on mammaprint results.    Orders Placed This  Encounter  Procedures  . CBC & Diff and Retic    Standing Status: Future     Number of Occurrences: 1     Standing Expiration Date: 02/15/2017  . Ferritin    Standing Status: Future     Number of Occurrences: 1     Standing Expiration Date: 02/15/2017  . Iron and TIBC    Standing Status: Future     Number of Occurrences: 1     Standing Expiration Date: 02/15/2017  . Comprehensive metabolic panel    Standing Status: Future     Number of Occurrences: 1     Standing Expiration Date: 02/15/2017   The patient has a good understanding of the overall plan. she agrees with it. she will call with any problems that may develop before the next visit here.   Rulon Eisenmenger, MD 02/16/2016

## 2016-02-17 ENCOUNTER — Telehealth: Payer: Self-pay | Admitting: Hematology and Oncology

## 2016-02-17 NOTE — Telephone Encounter (Signed)
s.w pt and advised on March 17 and 24 appts.Marland KitchenMarland KitchenMarland KitchenMarland Kitchenpt ok and aware

## 2016-02-20 ENCOUNTER — Ambulatory Visit (HOSPITAL_BASED_OUTPATIENT_CLINIC_OR_DEPARTMENT_OTHER): Payer: BLUE CROSS/BLUE SHIELD

## 2016-02-20 VITALS — BP 100/61 | HR 81 | Temp 98.4°F | Resp 18

## 2016-02-20 DIAGNOSIS — D508 Other iron deficiency anemias: Secondary | ICD-10-CM | POA: Diagnosis not present

## 2016-02-20 MED ORDER — SODIUM CHLORIDE 0.9 % IV SOLN
Freq: Once | INTRAVENOUS | Status: AC
  Administered 2016-02-20: 10:00:00 via INTRAVENOUS

## 2016-02-20 MED ORDER — SODIUM CHLORIDE 0.9 % IV SOLN
510.0000 mg | Freq: Once | INTRAVENOUS | Status: AC
  Administered 2016-02-20: 510 mg via INTRAVENOUS
  Filled 2016-02-20: qty 17

## 2016-02-20 NOTE — Patient Instructions (Signed)

## 2016-02-23 NOTE — Discharge Summary (Signed)
Physician Discharge Summary  Kiara Spencer [id number] DOB: [birth year] DOA: 02/05/2016  PCP: Lovenia Kim, MD  Consultation:  General surgery--Dr. Huntley Dec, Dr. Marla Roe   Admit date: 02/05/2016 Discharge date: 02/08/2016  Recommendations for Outpatient Follow-up:   Follow-up Information    Follow up with Wallace Going, DO In 1 week.   Specialty:  Plastic Surgery   Contact information:   [place] [address] [phone]       Follow up with Adin Hector, MD In 2 weeks.   Specialty:  General Surgery   Contact information:   [place] [address] [phone]      Discharge Diagnoses:  Bilateral nipple sparing mastectomy, right axillary lymph node dissection, insertion tissue expanders with Flex HD  Acute blood loss anemia    Surgical Procedure: Bilateral immediate breast reconstruction with placement of Acellular Dermal Matrix and tissue expanders.---Dr. Marla Roe  Inject blue dye right breast Right nipple sparing mastectomy Right axillary sentinel node biopsy with frozen section Complete right axillary lymph node dissection Left breast prophylactic nipple sparing mastectomy---Dr. Dalbert Batman   Discharge Condition: stable Disposition: home  Diet recommendation: regular  Filed Weights   02/05/16 0605 02/05/16 0623 02/05/16 1615  Weight: 68.493 kg (151 lb) 68.493 kg (151 lb) 68.04 kg (150 lb)    Filed Vitals:   02/07/16 2050 02/08/16 0506  BP: 128/63 138/65  Pulse: 92 90  Temp: 98.9 F (37.2 C) 99.1 F (37.3 C)  Resp: 19 19      Hospital Course:  Kiara Spencer underwent the procedure listed above.  Tolerated well and was transferred to the floor. On POD#2 hemoglobin dropped to 7.6.  She therefore remained in the hospital.  On POD#3 h&h remained stable.  The patient was hemodynamically stable and had minimal JP drainage output.  She was therefore felt stable for discharge home  with strict precautions and follow up with Plastics and Dr. Dalbert Batman.   Discharge Instructions     Medication List    STOP taking these medications        aspirin-acetaminophen-caffeine 250-250-65 MG tablet  Commonly known as:  [place] these medications        acetaminophen 500 MG tablet  Commonly known as:  TYLENOL  Take 2 tablets (1,000 mg total) by mouth every 6 (six) hours.     diazepam 2 MG tablet  Commonly known as:  VALIUM  Take 1 tablet (2 mg total) by mouth every 12 (twelve) hours.     docusate sodium 100 MG capsule  Commonly known as:  COLACE  Take 1 capsule (100 mg total) by mouth 2 (two) times daily.     HYDROcodone-acetaminophen 5-325 MG tablet  Commonly known as:  NORCO/VICODIN  Take 1-2 tablets by mouth every 4 (four) hours as needed for moderate pain.     HYDROcodone-acetaminophen 5-325 MG tablet  Commonly known as:  NORCO  Take 1-2 tablets by mouth every 6 (six) hours as needed for moderate pain.     KEFLEX 500 MG capsule  Generic drug:  cephALEXin  Take 500 mg by mouth.     naproxen 500 MG tablet  Commonly known as:  NAPROSYN  Take 1 tablet (500 mg total) by mouth 2 (two) times daily as needed for mild pain.     ondansetron 4 MG disintegrating tablet  Commonly known as:  ZOFRAN-ODT  Take 1 tablet (4 mg total) by mouth every 6 (  six) hours as needed for nausea.     oxyCODONE 10 mg 12 hr tablet  Commonly known as:  OXYCONTIN  Take 1 tablet (10 mg total) by mouth every 12 (twelve) hours.     polyethylene glycol packet  Commonly known as:  MIRALAX / GLYCOLAX  Take 17 g by mouth daily as needed for mild constipation.           Follow-up Information    Follow up with Wallace Going, DO In 1 week.   Specialty:  Plastic Surgery   Contact information:   [place] [address] [phone]       Follow up with Adin Hector, MD In 2 weeks.   Specialty:  General Surgery   Contact information:    [place] [address] [phone]        The results of significant diagnostics from this hospitalization (including imaging, microbiology, ancillary and laboratory) are listed below for reference.    Significant Diagnostic Studies: Nm Sentinel Node Inj-no Rpt (breast)  02/05/2016  CLINICAL DATA: cancer right breast upper outer quadrant Sulfur colloid was injected intradermally by the nuclear medicine technologist for breast cancer sentinel node localization.    Microbiology: No results found for this or any previous visit (from the past 240 hour(s)).   Labs: Basic Metabolic Panel: No results for input(s): NA, K, CL, CO2, GLUCOSE, BUN, CREATININE, CALCIUM, MG, PHOS in the last 168 hours. Liver Function Tests: No results for input(s): AST, ALT, ALKPHOS, BILITOT, PROT, ALBUMIN in the last 168 hours. No results for input(s): LIPASE, AMYLASE in the last 168 hours. No results for input(s): AMMONIA in the last 168 hours. CBC: No results for input(s): WBC, NEUTROABS, HGB, HCT, MCV, PLT in the last 168 hours. Cardiac Enzymes: No results for input(s): CKTOTAL, CKMB, CKMBINDEX, TROPONINI in the last 168 hours. BNP: BNP (last 3 results) No results for input(s): BNP in the last 8760 hours.  ProBNP (last 3 results) No results for input(s): PROBNP in the last 8760 hours.  CBG: No results for input(s): GLUCAP in the last 168 hours.  Active Problems:   * No active hospital problems. *  Time coordinating discharge: <30 mins   Signed:  Seena Face, ANP-BC

## 2016-02-24 ENCOUNTER — Encounter (HOSPITAL_COMMUNITY): Payer: Self-pay

## 2016-02-24 ENCOUNTER — Telehealth: Payer: Self-pay | Admitting: *Deleted

## 2016-02-24 ENCOUNTER — Ambulatory Visit: Payer: BLUE CROSS/BLUE SHIELD | Attending: General Surgery | Admitting: Physical Therapy

## 2016-02-24 DIAGNOSIS — Z9189 Other specified personal risk factors, not elsewhere classified: Secondary | ICD-10-CM

## 2016-02-24 DIAGNOSIS — R609 Edema, unspecified: Secondary | ICD-10-CM | POA: Diagnosis present

## 2016-02-24 DIAGNOSIS — M25611 Stiffness of right shoulder, not elsewhere classified: Secondary | ICD-10-CM | POA: Diagnosis present

## 2016-02-24 DIAGNOSIS — M25612 Stiffness of left shoulder, not elsewhere classified: Secondary | ICD-10-CM | POA: Insufficient documentation

## 2016-02-24 NOTE — Telephone Encounter (Signed)
Spoke with patient and confirmed appointment to see Dr. Lindi Adie 02/25/16 at 1pm to discuss mammaprint results.

## 2016-02-25 ENCOUNTER — Ambulatory Visit (HOSPITAL_BASED_OUTPATIENT_CLINIC_OR_DEPARTMENT_OTHER): Payer: BLUE CROSS/BLUE SHIELD | Admitting: Hematology and Oncology

## 2016-02-25 ENCOUNTER — Other Ambulatory Visit: Payer: Self-pay | Admitting: General Surgery

## 2016-02-25 ENCOUNTER — Telehealth: Payer: Self-pay | Admitting: Hematology and Oncology

## 2016-02-25 ENCOUNTER — Encounter: Payer: Self-pay | Admitting: Hematology and Oncology

## 2016-02-25 ENCOUNTER — Other Ambulatory Visit: Payer: Self-pay | Admitting: *Deleted

## 2016-02-25 VITALS — BP 110/67 | HR 86 | Temp 98.4°F | Resp 18 | Wt 150.6 lb

## 2016-02-25 DIAGNOSIS — C50411 Malignant neoplasm of upper-outer quadrant of right female breast: Secondary | ICD-10-CM | POA: Diagnosis not present

## 2016-02-25 DIAGNOSIS — C773 Secondary and unspecified malignant neoplasm of axilla and upper limb lymph nodes: Secondary | ICD-10-CM | POA: Diagnosis not present

## 2016-02-25 MED ORDER — ONDANSETRON HCL 8 MG PO TABS
8.0000 mg | ORAL_TABLET | Freq: Two times a day (BID) | ORAL | Status: DC | PRN
Start: 2016-02-25 — End: 2016-02-27

## 2016-02-25 MED ORDER — LORAZEPAM 0.5 MG PO TABS: 0.5000 mg | ORAL_TABLET | Freq: Every day | ORAL | Status: DC

## 2016-02-25 MED ORDER — PROCHLORPERAZINE MALEATE 10 MG PO TABS: 10.0000 mg | ORAL_TABLET | Freq: Four times a day (QID) | ORAL | Status: DC | PRN

## 2016-02-25 MED ORDER — LIDOCAINE-PRILOCAINE 2.5-2.5 % EX CREA: TOPICAL_CREAM | CUTANEOUS | Status: DC

## 2016-02-25 MED ORDER — DEXAMETHASONE 4 MG PO TABS: 4.0000 mg | ORAL_TABLET | Freq: Every day | ORAL | Status: DC

## 2016-02-25 NOTE — Progress Notes (Signed)
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 [facility])   Staging form: Breast, AJCC 7th Edition     Clinical stage from 12/10/2015: Stage IIA (T2, N0, M0) - Unsigned       Staging comments: Staged at breast conference on 1.4.17  SUMMARY OF ONCOLOGIC HISTORY:   Breast cancer of upper-outer quadrant of right female breast ([place])   11/24/2015 Mammogram 2 suspicious groups of calcifications right breast UOQ posteriorly 2.2 x 2.3 x 2.5 cm (by U/S measured 1.2 x 0.8 x 1.2 cm); anteriorly 1.1 x 1.2 x 0.8 cm (not seen by ultrasound)   11/27/2015 Initial Diagnosis Right breast biopsy UOQ Posterior: IDC with DCIS with, LVI present, ER 100%, PR 20%, Ki-67 30%, HER-2 negative; ANTERIOR: Complex sclerosing lesions with calcifications   02/05/2016 Surgery Rt. Mastectomy: IDC Grade 1, 3 cm, 1/11 LN positive,with IG DCIS 0.1 cm margin, Diffuse LVI, cancer at axill tail. T2N1 (Stage 2B); Left mastectomy: Fibrocystic ch, Mammaprint high risk      CHIEF COMPLIANT: Follow-up to discuss tumor board recommendations and results of Mammaprint testing  INTERVAL HISTORY: Kiara Spencer is a 45 year old with above-mentioned history of right breast cancer who had ER/PR positive HER-2 negative disease that was involving one out of 11 lymph nodes. She underwent bilateral mastectomies with reconstruction. She has a tissue expander in place. She is here to discuss the results of Mammaprint testing as well as the results of the tumor board discussion. She still has 1 drain appears to be healing quite well.  REVIEW OF SYSTEMS:   Constitutional: Denies fevers,  chills or abnormal weight loss Eyes: Denies blurriness of vision Ears, nose, mouth, throat, and face: Denies mucositis or sore throat Respiratory: Denies cough, dyspnea or wheezes Cardiovascular: Denies palpitation, chest discomfort Gastrointestinal:  Denies nausea, heartburn or change in bowel habits Skin: Denies abnormal skin rashes Lymphatics: Denies new lymphadenopathy or easy bruising Neurological:Denies numbness, tingling or new weaknesses Behavioral/Psych: Mood is stable, no new changes  Extremities: No lower extremity edema Breast: Bilateral mastectomies All other systems were reviewed with the patient and are negative.  I have reviewed the past medical history, past surgical history, social history and family history with the patient and they are unchanged from previous note.  ALLERGIES:  has No Known Allergies.  MEDICATIONS:  Current Outpatient Prescriptions  Medication Sig Dispense Refill  . acetaminophen (TYLENOL) 500 MG tablet Take 2 tablets (1,000 mg total) by mouth every 6 (six) hours. 30 tablet 0  . cephALEXin (KEFLEX) 500 MG capsule Take 500 mg by mouth. Reported on 02/24/2016    . dexamethasone (DECADRON) 4 MG tablet Take 1 tablet (4 mg total) by mouth daily. Take 1 tab day after chemo X 2 days 15 tablet 1  . diazepam (VALIUM) 2 MG tablet Take 1 tablet (2 mg total) by mouth every 12 (twelve) hours. 30 tablet 0  . docusate sodium (COLACE) 100 MG capsule Take 1 capsule (100 mg total) by mouth 2 (two) times daily. 10 capsule 0  . HYDROcodone-acetaminophen (NORCO) 5-325 MG tablet Take 1-2 tablets by mouth every 6 (six) hours as needed for moderate pain. (Patient not taking: Reported on 02/24/2016) 40 tablet 0  . HYDROcodone-acetaminophen (NORCO/VICODIN) 5-325  MG tablet Take 1-2 tablets by mouth every 4 (four) hours as needed for moderate pain. (Patient not taking: Reported on 02/24/2016) 30 tablet 0  . lidocaine-prilocaine (EMLA) cream Apply to affected area once 30 g 3  .  LORazepam (ATIVAN) 0.5 MG tablet Take 1 tablet (0.5 mg total) by mouth at bedtime. 30 tablet 0  . naproxen (NAPROSYN) 500 MG tablet Take 1 tablet (500 mg total) by mouth 2 (two) times daily as needed for mild pain. (Patient not taking: Reported on 02/24/2016)    . ondansetron (ZOFRAN) 8 MG tablet Take 1 tablet (8 mg total) by mouth 2 (two) times daily as needed. Start on the third day after chemotherapy. 30 tablet 1  . ondansetron (ZOFRAN-ODT) 4 MG disintegrating tablet Take 1 tablet (4 mg total) by mouth every 6 (six) hours as needed for nausea. (Patient not taking: Reported on 02/24/2016) 20 tablet 0  . oxyCODONE (OXYCONTIN) 10 mg 12 hr tablet Take 1 tablet (10 mg total) by mouth every 12 (twelve) hours. (Patient not taking: Reported on 02/24/2016) 60 tablet 0  . polyethylene glycol (MIRALAX / GLYCOLAX) packet Take 17 g by mouth daily as needed for mild constipation. 14 each 0  . prochlorperazine (COMPAZINE) 10 MG tablet Take 1 tablet (10 mg total) by mouth every 6 (six) hours as needed (Nausea or vomiting). 30 tablet 1   No current facility-administered medications for this visit.    PHYSICAL EXAMINATION: ECOG PERFORMANCE STATUS: 1 - Symptomatic but completely ambulatory  Filed Vitals:   02/25/16 1305  BP: 110/67  Pulse: 86  Temp: 98.4 F (36.9 C)  Resp: 18   Filed Weights   02/25/16 1305  Weight: 150 lb 9.6 oz (68.312 kg)    GENERAL:alert, no distress and comfortable SKIN: skin color, texture, turgor are normal, no rashes or significant lesions EYES: normal, Conjunctiva are pink and non-injected, sclera clear OROPHARYNX:no exudate, no erythema and lips, buccal mucosa, and tongue normal  NECK: supple, thyroid normal size, non-tender, without nodularity LYMPH:  no palpable lymphadenopathy in the cervical, axillary or inguinal LUNGS: clear to auscultation and percussion with normal breathing effort HEART: regular rate & rhythm and no murmurs and no lower extremity  edema ABDOMEN:abdomen soft, non-tender and normal bowel sounds MUSCULOSKELETAL:no cyanosis of digits and no clubbing  NEURO: alert & oriented x 3 with fluent speech, no focal motor/sensory deficits EXTREMITIES: No lower extremity edema  LABORATORY DATA:  I have reviewed the data as listed   Chemistry      Component Value Date/Time   NA 141 02/16/2016 0923   NA 138 02/07/2016 0551   K 3.8 02/16/2016 0923   K 3.9 02/07/2016 0551   CL 104 02/07/2016 0551   CO2 30* 02/16/2016 0923   CO2 26 02/07/2016 0551   BUN 15.1 02/16/2016 0923   BUN 7 02/07/2016 0551   CREATININE 0.8 02/16/2016 0923   CREATININE 0.76 02/07/2016 0551      Component Value Date/Time   CALCIUM 9.5 02/16/2016 0923   CALCIUM 8.7* 02/07/2016 0551   ALKPHOS 56 02/16/2016 0923   ALKPHOS 57 01/28/2016 0850   AST 14 02/16/2016 0923   AST 21 01/28/2016 0850   ALT 10 02/16/2016 0923   ALT 14 01/28/2016 0850   BILITOT <0.30 02/16/2016 0923   BILITOT 0.2* 01/28/2016 0850       Lab Results  Component Value Date   WBC 9.0 02/16/2016   HGB 7.9* 02/16/2016   HCT 25.3* 02/16/2016   MCV 91.0 02/16/2016  PLT 511* 02/16/2016   NEUTROABS 6.7* 02/16/2016     ASSESSMENT & PLAN:  Breast cancer of upper-outer quadrant of right female breast ([place]) Rt. 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 T2N1 (Stage 2B); Left mastectomy: Fibrocystic ch. Mammaprint high risk  Tumor board recommendation: 1. No indication for repeat resection since there is nothing further to move in the posterior side 2. Mammaprint: High risk, I recommended systemic chemotherapy 3. Followed by radiation therapy (she may choose to undergo radiation in [place].) 4. Followed by antiestrogen therapy.   Chemotherapy Counseling: I discussed the risks and benefits of chemotherapy including the risks of nausea/ vomiting, risk of infection from low WBC count, fatigue due to  chemo or anemia, bruising or bleeding due to low platelets, mouth sores, loss/ change in taste and decreased appetite and peripheral neuropathy. Liver and kidney function will be monitored through out chemotherapy as abnormalities in liver and kidney function may be a side effect of treatment. Cardiac dysfunction due to Adriamycin was discussed in detail. Risk of permanent bone marrow dysfunction and leukemia due to chemo were also discussed.  I recommended that she participate in PREVENT clinical trial.   Orders Placed This Encounter  Procedures  . CBC with Differential    Standing Status: Standing     Number of Occurrences: 20     Standing Expiration Date: 02/25/2017  . Comprehensive metabolic panel    Standing Status: Standing     Number of Occurrences: 20     Standing Expiration Date: 02/25/2017  . PHYSICIAN COMMUNICATION ORDER    A baseline Echo/ [place] should be obtained prior to initiation of Anthracycline Chemotherapy   The patient has a good understanding of the overall plan. she agrees with it. she will call with any problems that may develop before the next visit here.   Rulon Eisenmenger, MD 02/25/2016

## 2016-02-25 NOTE — Therapy (Signed)
[place], [place], [address] Phone: [phone]   Fax:  [phone]  Physical Therapy Evaluation  Patient Details  Name: Kiara Spencer MRN: [id number] Date of Birth: [birth year] Referring Provider: Dr. [name]   Encounter Date: 02/24/2016      PT End of Session - 02/25/16 1353    Visit Number 1   Number of Visits 9   Date for PT Re-Evaluation 03/28/17   PT Start Time 1300   PT Stop Time 1345   PT Time Calculation (min) 45 min   Activity Tolerance Patient tolerated treatment well   Behavior During Therapy [facility] for tasks assessed/performed      Past Medical History  Diagnosis Date  . Breast cancer of upper-outer quadrant of right female breast ([place]) 12/02/2015  . Breast cancer ([place])   . Headache     hx migraines    Past Surgical History  Procedure Laterality Date  . Cesarean section  x 2  . Dilation and curettage of uterus    . Mastectomy w/ sentinel node biopsy Right 02/05/2016    Procedure: RIGHT NIPPLE SPARING MASTECTOMY WITH RIGHT AXILLARY SENTINEL LYMPH NODE BIOPSY;  Surgeon: [name], MD;  Location: [place];  Service: General;  Laterality: Right;  . Mastectomy, partial Left 02/05/2016    Procedure: LEFT PROPHYLACTIC NIPPLE SPARING MASTECTOMY ;  Surgeon: [name], MD;  Location: [place];  Service: General;  Laterality: Left;  . Breast reconstruction with placement of tissue expander and flex hd (acellular hydrated dermis) Bilateral 02/05/2016    Procedure: BILATERAL BREAST RECONSTRUCTION WITH PLACEMENT OF TISSUE EXPANDER AND FLEX HD (ACELLULAR HYDRATED DERMIS);  Surgeon: Loel Lofty Dillingham, DO;  Location: [place];  Service: Plastics;  Laterality: Bilateral;    There were no vitals filed for this visit.  Visit Diagnosis:  Postoperative edema - Plan: PT plan of care cert/re-cert  At risk for lymphedema - Plan: PT plan of care cert/re-cert  Stiffness of joint, shoulder region,  right - Plan: PT plan of care cert/re-cert  Stiffness of joint, shoulder region, left - Plan: PT plan of care cert/re-cert      Subjective Assessment - 02/24/16 1308    Subjective Im here because my arm swellled up  I feel like I'm ready for a sport bra because binder hurts.  She says her shoulders feel fine, but still has a not of numbess    Pertinent History  February 05, 2015  bilateral mastectomy wih 11 nodes removed from right axilla with immediate expander placement from Dr. Migdalia Dk Dillingham  still has a drain on right side.  Will find out tomorrow if she will have chemotherapy and will find out later if she will have radiatin    Patient Stated Goals to get a compression sleeve, to begin the steps to getting back to full range of motion    Currently in Pain? Yes   Pain Score 4    Pain Location Chest  expanders    Pain Orientation Right   Pain Descriptors / Indicators Aching;Pressure   Pain Type Acute pain   Pain Radiating Towards possibly toward right arm    Pain Frequency Constant   Effect of Pain on Daily Activities yes, but pt can push through             [facility] PT Assessment - 02/25/16 0001    Assessment   Medical Diagnosis Right breast cancer   Referring Provider Dr. [name]    Onset  Date/Surgical Date 11/24/15   Hand Dominance Right   Prior Therapy none   Precautions   Precautions Other (comment)   Precaution Comments Active breast cancer   Restrictions   Weight Bearing Restrictions No   Balance Screen   Has the patient fallen in the past 6 months Yes   How many times? 1  slipped on something  will not be address this session    Has the patient had a decrease in activity level because of a fear of falling?  No   Is the patient reluctant to leave their home because of a fear of falling?  No   Home Social worker Private residence   Living Arrangements Spouse/significant other;Children  Husband, 103 and 67 y.o. 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 She still have on drain coming from right side with minimal drainage. She has fullness in right upper arm especially around elbow  there is no warmth or redness    Skin Integrity healing incisions at both breasts    Other Surveys  Other Surveys  Lymphedema Life Impact Scale 27 or 40%   Coordination   Gross Motor Movements are Fluid and Coordinated --  pt appears to move slowly and protectively    Posture/Postural Control   Posture/Postural Control No significant limitations   ROM / Strength   AROM / PROM / Strength AROM   AROM   AROM Assessment Site Shoulder   Right/Left Shoulder Right;Left   Right Shoulder Flexion 87 Degrees   Right Shoulder ABduction 70 Degrees   Right Shoulder Internal Rotation 40 Degrees   Right Shoulder External Rotation 82 Degrees   Left Shoulder Flexion 105 Degrees   Left Shoulder ABduction 85 Degrees   Left Shoulder Internal Rotation 75 Degrees   Left Shoulder External Rotation 90 Degrees   Strength   Overall Strength Within functional limits for tasks performed   Palpation   Palpation comment nonpitting fullness in right upper arm with tightness at right axilla            LYMPHEDEMA/ONCOLOGY QUESTIONNAIRE - 02/24/16 1331    Right Upper Extremity Lymphedema   10 cm Proximal to Olecranon Process 30.8 cm   Olecranon Process 24.3 cm   10 cm Proximal to Ulnar Styloid Process 21.5 cm   Just Proximal to Ulnar Styloid Process 14.2 cm   Across Hand at [employer] 17.5 cm   At [place] of 2nd Digit 5.7 cm    Left Upper Extremity Lymphedema   10 cm Proximal to Olecranon Process 29 cm   Olecranon Process 22.5 cm   10 cm Proximal to Ulnar Styloid Process 20.5 cm   Just Proximal to Ulnar Styloid Process 13.8 cm   Across Hand at [employer] 17.5 cm   At [place] of 2nd Digit 5.6 cm                [facility] Adult PT Treatment/Exercise - 02/25/16 0001    Self-Care   Self-Care Other Self-Care Comments  Other Self-Care Comments  medium tg soft applied to upper arm folded over at the top to provide comfort                 PT Education - 02/25/16 1352    Education provided Yes   Education Details wear tg soft for compfort only, remove or wear as needed.  do not allow stockinette to toll down into upper arm    Person(s) Educated Patient   Methods Explanation   Comprehension Verbalized understanding              Breast Clinic Goals - 12/10/15 1059    Patient will be able to verbalize understanding of pertinent lymphedema risk reduction practices relevant to her diagnosis specifically related to skin care.   Time 1   Period Days   Status Achieved   Patient will be able to return demonstrate and/or verbalize understanding of the post-op home exercise program related to regaining shoulder range of motion.   Time 1   Period Days   Status Achieved   Patient will be able to verbalize understanding of the importance of attending the postoperative After Breast Cancer Class for further lymphedema risk reduction education and therapeutic exercise.   Time 1   Period Days   Status Achieved          Long Term Clinic Goals - 02/25/16 1403    CC Long Term Goal  #1   Title Patient will improve right  shoulder abduction to 120 degrees so that she can achieve position needed for radiation therapy if that is necessary    Time 4   Period Weeks   Status New   CC Long Term Goal  #2   Title Patient will improve left shoulder abduction to 150  degrees so that she can be independent in  overhead activcities and self care   Time 4   Period Weeks   Status New   CC Long Term Goal  #3   Title Patient will have a circumferential reduction of     1    cm at      10    cm above the   right           olecranon process   Baseline 24.3 on 02/24/2016   Time 4   Period Weeks   Status New   CC Long Term Goal  #4   Title Patient with verbalize an understanding of lymphedema risk reduction precautions   Time 4   Period Weeks   Status New            Plan - 02/25/16 1356    Clinical Impression Statement Pt has had recent surgery with 11 nodes removed from right side and still has a drain on the right who developed swelling in right She is still waiting to find out if her treatment witll include chemo or radiation  She will benefit from PT for post op shoulder ROM and strength (when cleared) as well as management for the swelling    Pt will benefit from skilled therapeutic intervention in order to improve on the following deficits Decreased range of motion;Increased fascial restricitons;Impaired UE functional use;Increased muscle spasms;Decreased knowledge of precautions;Decreased skin integrity;Pain;Decreased knowledge of use of DME;Decreased strength;Increased edema   Rehab Potential Good   Clinical Impairments Affecting Rehab Potential 11 nodes removed    PT Frequency 2x / week   PT Duration 4 weeks   PT Treatment/Interventions ADLs/Self Care Home Management;Manual lymph  drainage;Compression bandaging;Passive range of motion;Patient/family education;Therapeutic activities;Therapeutic exercise;Taping   PT Next Visit Plan Remeasure right arm  Progress range of motion of shoulders as drain is removed. Upgraded HEP  ask if she wants to sign up for ABC class progress as indicated    Consulted and Agree with Plan of Care Patient         Problem List Patient Active Problem List   Diagnosis Date Noted  . Other iron deficiency anemias 02/16/2016  . Genetic testing 12/22/2015  .  Family history of cancer 12/22/2015  . Family history of breast cancer in mother 12/11/2015  . Breast cancer of upper-outer quadrant of right female breast ([place]) 12/02/2015   Donato Heinz. Owens Shark, PT   02/25/2016, 2:13 PM  [place], [place], [address] Phone: [phone]   Fax:  [phone]  Name: Toney Brumer MRN: [id number] Date of Birth: Mar 08, 1971

## 2016-02-25 NOTE — Telephone Encounter (Signed)
appt made per 3/22 pof. Pt will get updated sch on 3/24 visit

## 2016-02-25 NOTE — Assessment & Plan Note (Addendum)
Rt. 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 T2N1 (Stage 2B); Left mastectomy: Fibrocystic ch. Mammaprint high risk  Tumor board recommendation: 1. No indication for repeat resection since there is nothing further to move in the posterior side 2. Mammaprint: High risk, I recommended systemic chemotherapy 3. Followed by radiation therapy 4. Followed by antiestrogen therapy.   Chemotherapy Counseling: I discussed the risks and benefits of chemotherapy including the risks of nausea/ vomiting, risk of infection from low WBC count, fatigue due to chemo or anemia, bruising or bleeding due to low platelets, mouth sores, loss/ change in taste and decreased appetite and peripheral neuropathy. Liver and kidney function will be monitored through out chemotherapy as abnormalities in liver and kidney function may be a side effect of treatment. Cardiac dysfunction due to Adriamycin was discussed in detail. Risk of permanent bone marrow dysfunction and leukemia due to chemo were also discussed.  I recommended that she participate in PREVENT clinical trial.

## 2016-02-26 ENCOUNTER — Ambulatory Visit: Payer: BLUE CROSS/BLUE SHIELD

## 2016-02-27 ENCOUNTER — Encounter: Payer: Self-pay | Admitting: *Deleted

## 2016-02-27 ENCOUNTER — Encounter (HOSPITAL_BASED_OUTPATIENT_CLINIC_OR_DEPARTMENT_OTHER): Payer: Self-pay | Admitting: *Deleted

## 2016-02-27 ENCOUNTER — Ambulatory Visit (HOSPITAL_BASED_OUTPATIENT_CLINIC_OR_DEPARTMENT_OTHER): Payer: BLUE CROSS/BLUE SHIELD

## 2016-02-27 VITALS — BP 104/58 | HR 76 | Temp 98.4°F | Resp 18

## 2016-02-27 DIAGNOSIS — D649 Anemia, unspecified: Secondary | ICD-10-CM

## 2016-02-27 DIAGNOSIS — D509 Iron deficiency anemia, unspecified: Secondary | ICD-10-CM

## 2016-02-27 DIAGNOSIS — D508 Other iron deficiency anemias: Secondary | ICD-10-CM

## 2016-02-27 MED ORDER — SODIUM CHLORIDE 0.9 % IV SOLN
Freq: Once | INTRAVENOUS | Status: AC
  Administered 2016-02-27: 09:00:00 via INTRAVENOUS

## 2016-02-27 MED ORDER — SODIUM CHLORIDE 0.9 % IV SOLN
510.0000 mg | Freq: Once | INTRAVENOUS | Status: AC
  Administered 2016-02-27: 510 mg via INTRAVENOUS
  Filled 2016-02-27: qty 17

## 2016-02-27 NOTE — Patient Instructions (Signed)

## 2016-02-27 NOTE — Progress Notes (Addendum)
Pt going for CXR Monday at [facility]. Bring all medications. Dr. Lissa Hoard reviewed pt's labs from  -March 13 th- ordered to get I stat 8, day of surgery.

## 2016-02-29 NOTE — H&P (Signed)
Kiara Spencer. Couse  Location: [facility] Patient #: [id number] DOB: 1971/02/16 Married / Language: English / Race: Black or African American Female        History of Present Illness   The patient is a 45 year old female who presents with breast cancer. This is a 45 year old female who returns for her first postop visit following bilateral nipple sparing mastectomy and right axillary sentinel lymph node biopsy and complete right axillary lymph node dissection. Date of surgery was February 05, 2016. Dr. Marla Roe is her plastic surgeon Dr. Lindi Adie is her oncologist. Dr. Lisbeth Renshaw is her radiation oncologist.  She was initially evaluated with a palpable mass in the axillary tail of the right breast. Family history was positive for mother with breast cancer, maternal aunt with breast cancer, maternal cousin with breast cancer. Genetics was negative. Biopsy showed invasive ductal carcinoma, hormone receptor positive, HER-2 negative. She had a second density in the upper-outer quadrant somewhat more anterior which was biopsied and showed a complex sclerosing lesion. Lengthy discussion and consultation was performed. The patient was strongly motivated for bilateral nipple sparing mastectomies. On February 05, 2016 she underwent right nipple sparing mastectomy with sentinel node biopsy and complete right axillary lymph node dissection and left breast prophylactic nipple sparing mastectomy. 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 The tumor in the axilla tail was 3 cm. She had invasive cancer focally present at the posterior margin within the axilla tail but the posterior margin is the axillary space and so there is no further surgery to be done. The tumor came cleanly off of the muscle and the skin. She is being followed closely by plastic surgery. She still has 1 drain in place.  She was seen here last week and had a little bit of right arm swelling which is  not surprising. She went to physical therapy and had a sleeve placed she says she feels much better but still has a little bit of puffiness at her elbow. I told her not to move her shoulders around or take a physical therapy for her shoulder range of motion until Dr. Marla Roe gave her the okay.  Discussed in breast conference this morning. Saw Dr. Lindi Adie today. He recommends Port-A-Cath placement, adjuvant chemotherapy and then later on adjuvant radiation therapy. She has agreed to all of this. She will be scheduled for Port-A-Cath insertion with ultrasound. I will try to place this on the left side but either side should be reasonable. I discussed the indications, details, techniques, numerous risk of Port-A-Cath insertion with her. She is aware the risks of bleeding, infection, technical problems requiring contralateral placement, port malfunction requiring revision, arm swelling with thrombosis. Catheter embolization. Pneumothorax requiring chest tube. She understands the port will be removed and later on she'll need radiation therapy later. All of her questions are answered. She understands all of these issues. She agrees with this plan. We will initially scheduling female. She is scheduled to start chemotherapy on April 10.   Addendum Note Patient is scheduled for Port-A-Cath next Tuesday. Kim with Dr. Marla Roe called and asked if I would remove the drain on Tuesday since it was due to be removed at that time by Dr. Marla Roe. I will plan to remove the drain in the operating room.   Allergies  No Known Drug Allergies01/11/2016  Medication History  Ibuprofen (200MG Capsule, Oral) Active. Illusions AA Breast Prosthesis (1 (one)) Active. (Bilateral Mastectomy; [id number] Post-mastectomy camisole - to hold drain tubes and  wear during healing.; QTY: 2; Length of use: 3 Months No Refills ; L8000 Post-surgical bras - to hold breast prosthesis ; QTY: 6; Length of use:3 Months 3  Refills ; L8020Non-silicone breast prosthesis - for temporary use during healing or for comfort at end of day, lighter weight, cooler for hot weather.; QTY: 1 for single, 2 for bilateral ; Length of use: 6 months Up to 1 refill; Replacement ; H0016 Silicone Breast Prosthesis - to restore balance and symmetry after breast surgery; QTY: 1 for single, 2 for Bilateral; Length of use: 2 years No refills) Percocet (5-325MG Tablet, Oral) Active. Valium (2MG Tablet, Oral) Active. Colace (100MG Capsule, Oral) Active. MiraLax (Oral) Active. Vitamin C (500MG Capsule, Oral) Active. Medications Reconciled  Vitals  Weight: 148 lb Height: 61in Body Surface Area: 1.66 m Body Mass Index: 27.96 kg/m  Temp.: 98.50F  Pulse: 84 (Regular)  BP: 118/76 (Sitting, Left Arm, Standard)       Physical Exam  General Note: Pleasant. Quiet. A little anxious because she just learned she will need chemotherapy. No physical distress.   Head and Neck Note: No adenopathy or mass. Clavicles feel normal.  Heart:  RRR, no murmer or ectopy  Lungs:  Clear bilaterally   Chest and Lung Exam Note: Clear to auscultation bilaterally   Breast Note: Bilateral nipple sparing mastectomies look good. A little flattening of the right nipple. Minimal vascular compromise. Skin flaps look good. I can feel the expanders. No axillary mass   Musculoskeletal Note: Trace right arm edema. Nontender. No cords. Mostly noticeable at the elbow     Assessment & Plan  BREAST CANCER OF UPPER-OUTER QUADRANT OF RIGHT FEMALE BREAST (C50.411).    You are recovering from your bilateral nipple sparing mastectomies and right axillary lymph node surgery without any major complication The right arm swelling is not unusual and it is getting better with the compression sleeve If the right arm swelling persists more than 1 week give me a call and we will do an ultrasound of the veins.  You should not do any physical  therapy to her shoulders until  Dr. Marla Roe gives you permission  Dr. Lindi Adie has recommended intravenous chemotherapy and a Port-A-Cath insertion. We have discussed the indications, details, techniques, and numerous risk of port insertion. You have agreed with this plan you will be scheduled for Port-A-Cath insertion as discussed We will try to do this before April 10 which is the day that you are supposed to start chemotherapy  The patient was given a refill on Percocet today.  FAMILY HISTORY OF BREAST CANCER (Z80.3)  Addendum Note(Rochele Lueck M. Dalbert Batman MD; 02/27/2016 5:49 PM) Port-A-Cath insertion is scheduled for next Tuesday, and Dr. Marla Roe was planning to remove her remaining drain on that date. They have requested that I remove the remaining drain. I will remove the remaining drain in the operating room.     Edsel Petrin. Dalbert Batman, M.D., [facility] General and Minimally invasive Surgery Breast and Colorectal Surgery Office:   [phone] Pager:   [phone]

## 2016-03-01 ENCOUNTER — Other Ambulatory Visit: Payer: BLUE CROSS/BLUE SHIELD

## 2016-03-01 ENCOUNTER — Ambulatory Visit: Payer: BLUE CROSS/BLUE SHIELD

## 2016-03-01 ENCOUNTER — Other Ambulatory Visit: Payer: Self-pay | Admitting: *Deleted

## 2016-03-02 ENCOUNTER — Telehealth: Payer: Self-pay | Admitting: Hematology and Oncology

## 2016-03-02 ENCOUNTER — Ambulatory Visit (HOSPITAL_BASED_OUTPATIENT_CLINIC_OR_DEPARTMENT_OTHER)
Admission: RE | Admit: 2016-03-02 | Discharge: 2016-03-02 | Disposition: A | Discharge status: Home / Self Care | Payer: BLUE CROSS/BLUE SHIELD | Source: Ambulatory Visit | Attending: General Surgery | Admitting: General Surgery

## 2016-03-02 ENCOUNTER — Ambulatory Visit (HOSPITAL_BASED_OUTPATIENT_CLINIC_OR_DEPARTMENT_OTHER): Payer: BLUE CROSS/BLUE SHIELD | Admitting: Anesthesiology

## 2016-03-02 ENCOUNTER — Ambulatory Visit (HOSPITAL_COMMUNITY): Payer: BLUE CROSS/BLUE SHIELD

## 2016-03-02 ENCOUNTER — Encounter (HOSPITAL_BASED_OUTPATIENT_CLINIC_OR_DEPARTMENT_OTHER): Payer: Self-pay | Admitting: *Deleted

## 2016-03-02 ENCOUNTER — Encounter (HOSPITAL_BASED_OUTPATIENT_CLINIC_OR_DEPARTMENT_OTHER)
Admission: RE | Disposition: A | Discharge status: Home / Self Care | Payer: Self-pay | Source: Ambulatory Visit | Attending: General Surgery

## 2016-03-02 DIAGNOSIS — Z803 Family history of malignant neoplasm of breast: Secondary | ICD-10-CM | POA: Insufficient documentation

## 2016-03-02 DIAGNOSIS — C773 Secondary and unspecified malignant neoplasm of axilla and upper limb lymph nodes: Secondary | ICD-10-CM | POA: Insufficient documentation

## 2016-03-02 DIAGNOSIS — Z95828 Presence of other vascular implants and grafts: Secondary | ICD-10-CM

## 2016-03-02 DIAGNOSIS — C50411 Malignant neoplasm of upper-outer quadrant of right female breast: Secondary | ICD-10-CM | POA: Diagnosis not present

## 2016-03-02 DIAGNOSIS — Z171 Estrogen receptor negative status [ER-]: Secondary | ICD-10-CM | POA: Insufficient documentation

## 2016-03-02 DIAGNOSIS — Z01818 Encounter for other preprocedural examination: Secondary | ICD-10-CM

## 2016-03-02 HISTORY — PX: PORTACATH PLACEMENT: SHX2246

## 2016-03-02 LAB — POCT I-STAT, CHEM 8
BUN: 15 mg/dL (ref 6–20)
CALCIUM ION: 1.15 mmol/L (ref 1.12–1.23)
CREATININE: 0.7 mg/dL (ref 0.44–1.00)
Chloride: 102 mmol/L (ref 101–111)
GLUCOSE: 85 mg/dL (ref 65–99)
HCT: 36 % (ref 36.0–46.0)
Hemoglobin: 12.2 g/dL (ref 12.0–15.0)
Potassium: 3.9 mmol/L (ref 3.5–5.1)
Sodium: 140 mmol/L (ref 135–145)
TCO2: 24 mmol/L (ref 0–100)

## 2016-03-02 SURGERY — INSERTION, TUNNELED CENTRAL VENOUS DEVICE, WITH PORT
Anesthesia: General | Site: Chest | Laterality: Right

## 2016-03-02 MED ORDER — OXYCODONE HCL 5 MG/5ML PO SOLN: 5.0000 mg | Freq: Once | ORAL | Status: DC | PRN

## 2016-03-02 MED ORDER — HEPARIN SOD (PORK) LOCK FLUSH 100 UNIT/ML IV SOLN
INTRAVENOUS | Status: DC | PRN
  Administered 2016-03-02: 400 [IU] via INTRAVENOUS

## 2016-03-02 MED ORDER — MIDAZOLAM HCL 2 MG/2ML IJ SOLN
INTRAMUSCULAR | Status: AC
  Filled 2016-03-02: qty 2

## 2016-03-02 MED ORDER — CHLORHEXIDINE GLUCONATE 4 % EX LIQD: 1.0000 "application " | Freq: Once | CUTANEOUS | Status: DC

## 2016-03-02 MED ORDER — FENTANYL CITRATE (PF) 100 MCG/2ML IJ SOLN: 25.0000 ug | INTRAMUSCULAR | Status: DC | PRN

## 2016-03-02 MED ORDER — FENTANYL CITRATE (PF) 100 MCG/2ML IJ SOLN
50.0000 ug | INTRAMUSCULAR | Status: DC | PRN
  Administered 2016-03-02 (×2): 50 ug via INTRAVENOUS

## 2016-03-02 MED ORDER — PROPOFOL 10 MG/ML IV BOLUS
INTRAVENOUS | Status: AC
  Filled 2016-03-02: qty 20

## 2016-03-02 MED ORDER — MIDAZOLAM HCL 2 MG/2ML IJ SOLN
1.0000 mg | INTRAMUSCULAR | Status: DC | PRN
  Administered 2016-03-02: 2 mg via INTRAVENOUS

## 2016-03-02 MED ORDER — SCOPOLAMINE 1 MG/3DAYS TD PT72: 1.0000 | MEDICATED_PATCH | Freq: Once | TRANSDERMAL | Status: DC | PRN

## 2016-03-02 MED ORDER — BUPIVACAINE-EPINEPHRINE (PF) 0.5% -1:200000 IJ SOLN
INTRAMUSCULAR | Status: DC | PRN
  Administered 2016-03-02: 5 mL

## 2016-03-02 MED ORDER — LIDOCAINE HCL (CARDIAC) 20 MG/ML IV SOLN
INTRAVENOUS | Status: DC | PRN
  Administered 2016-03-02: 70 mg via INTRAVENOUS

## 2016-03-02 MED ORDER — HEPARIN (PORCINE) IN NACL 2-0.9 UNIT/ML-% IJ SOLN
INTRAMUSCULAR | Status: AC
  Filled 2016-03-02: qty 500

## 2016-03-02 MED ORDER — ONDANSETRON HCL 4 MG/2ML IJ SOLN
INTRAMUSCULAR | Status: AC
  Filled 2016-03-02: qty 2

## 2016-03-02 MED ORDER — FENTANYL CITRATE (PF) 100 MCG/2ML IJ SOLN
INTRAMUSCULAR | Status: AC
  Filled 2016-03-02: qty 2

## 2016-03-02 MED ORDER — BUPIVACAINE-EPINEPHRINE (PF) 0.5% -1:200000 IJ SOLN
INTRAMUSCULAR | Status: AC
  Filled 2016-03-02: qty 30

## 2016-03-02 MED ORDER — VANCOMYCIN HCL IN DEXTROSE 1-5 GM/200ML-% IV SOLN
1000.0000 mg | Freq: Once | INTRAVENOUS | Status: AC
  Administered 2016-03-02: 1000 mg via INTRAVENOUS

## 2016-03-02 MED ORDER — GLYCOPYRROLATE 0.2 MG/ML IJ SOLN: 0.2000 mg | Freq: Once | INTRAMUSCULAR | Status: DC | PRN

## 2016-03-02 MED ORDER — ONDANSETRON HCL 4 MG/2ML IJ SOLN: 4.0000 mg | Freq: Once | INTRAMUSCULAR | Status: DC | PRN

## 2016-03-02 MED ORDER — DEXAMETHASONE SODIUM PHOSPHATE 4 MG/ML IJ SOLN
INTRAMUSCULAR | Status: DC | PRN
  Administered 2016-03-02: 10 mg via INTRAVENOUS

## 2016-03-02 MED ORDER — LACTATED RINGERS IV SOLN
INTRAVENOUS | Status: DC
  Administered 2016-03-02: 11:00:00 via INTRAVENOUS

## 2016-03-02 MED ORDER — PROPOFOL 10 MG/ML IV BOLUS
INTRAVENOUS | Status: DC | PRN
  Administered 2016-03-02: 50 mg via INTRAVENOUS
  Administered 2016-03-02: 100 mg via INTRAVENOUS

## 2016-03-02 MED ORDER — HEPARIN SOD (PORK) LOCK FLUSH 100 UNIT/ML IV SOLN
INTRAVENOUS | Status: AC
  Filled 2016-03-02: qty 5

## 2016-03-02 MED ORDER — ONDANSETRON HCL 4 MG/2ML IJ SOLN
INTRAMUSCULAR | Status: DC | PRN
  Administered 2016-03-02: 4 mg via INTRAVENOUS

## 2016-03-02 MED ORDER — CEFAZOLIN SODIUM-DEXTROSE 2-4 GM/100ML-% IV SOLN
INTRAVENOUS | Status: AC
  Filled 2016-03-02: qty 100

## 2016-03-02 MED ORDER — OXYCODONE HCL 5 MG PO TABS: 5.0000 mg | ORAL_TABLET | Freq: Once | ORAL | Status: DC | PRN

## 2016-03-02 MED ORDER — VANCOMYCIN HCL IN DEXTROSE 1-5 GM/200ML-% IV SOLN
INTRAVENOUS | Status: AC
  Filled 2016-03-02: qty 200

## 2016-03-02 MED ORDER — CEFAZOLIN SODIUM-DEXTROSE 2-4 GM/100ML-% IV SOLN: 2.0000 g | INTRAVENOUS | Status: DC

## 2016-03-02 MED ORDER — DEXAMETHASONE SODIUM PHOSPHATE 10 MG/ML IJ SOLN
INTRAMUSCULAR | Status: AC
  Filled 2016-03-02: qty 1

## 2016-03-02 MED ORDER — HEPARIN (PORCINE) IN NACL 2-0.9 UNIT/ML-% IJ SOLN
INTRAMUSCULAR | Status: DC | PRN
  Administered 2016-03-02: 500 mL via INTRAVENOUS

## 2016-03-02 MED ORDER — LIDOCAINE HCL (CARDIAC) 20 MG/ML IV SOLN
INTRAVENOUS | Status: AC
  Filled 2016-03-02: qty 5

## 2016-03-02 MED ORDER — HYDROCODONE-ACETAMINOPHEN 5-325 MG PO TABS: 1.0000 | ORAL_TABLET | Freq: Four times a day (QID) | ORAL | Status: DC | PRN

## 2016-03-02 SURGICAL SUPPLY — 56 items
ADH SKN CLS APL DERMABOND .7 (GAUZE/BANDAGES/DRESSINGS) ×1
APL SKNCLS STERI-STRIP NONHPOA (GAUZE/BANDAGES/DRESSINGS)
BAG DECANTER FOR FLEXI CONT (MISCELLANEOUS) ×3 IMPLANT
BENZOIN TINCTURE PRP APPL 2/3 (GAUZE/BANDAGES/DRESSINGS) IMPLANT
BLADE HEX COATED 2.75 (ELECTRODE) ×3 IMPLANT
BLADE SURG 15 STRL LF DISP TIS (BLADE) ×1 IMPLANT
BLADE SURG 15 STRL SS (BLADE) ×6
CANISTER SUCT 1200ML W/VALVE (MISCELLANEOUS) IMPLANT
CHLORAPREP W/TINT 26ML (MISCELLANEOUS) ×3 IMPLANT
CLOSURE WOUND 1/2 X4 (GAUZE/BANDAGES/DRESSINGS)
COVER BACK TABLE 60X90IN (DRAPES) ×3 IMPLANT
COVER MAYO STAND STRL (DRAPES) ×3 IMPLANT
COVER PROBE 5X48 (MISCELLANEOUS) ×3
DECANTER SPIKE VIAL GLASS SM (MISCELLANEOUS) IMPLANT
DERMABOND ADVANCED (GAUZE/BANDAGES/DRESSINGS) ×2
DERMABOND ADVANCED .7 DNX12 (GAUZE/BANDAGES/DRESSINGS) ×1 IMPLANT
DRAPE C-ARM 42X72 X-RAY (DRAPES) ×3 IMPLANT
DRAPE LAPAROSCOPIC ABDOMINAL (DRAPES) ×3 IMPLANT
DRAPE UTILITY XL STRL (DRAPES) ×3 IMPLANT
DRSG TEGADERM 2-3/8X2-3/4 SM (GAUZE/BANDAGES/DRESSINGS) IMPLANT
DRSG TEGADERM 4X10 (GAUZE/BANDAGES/DRESSINGS) IMPLANT
DRSG TEGADERM 4X4.75 (GAUZE/BANDAGES/DRESSINGS) IMPLANT
ELECT REM PT RETURN 9FT ADLT (ELECTROSURGICAL) ×3
ELECTRODE REM PT RTRN 9FT ADLT (ELECTROSURGICAL) ×1 IMPLANT
GLOVE EUDERMIC 7 POWDERFREE (GLOVE) ×3 IMPLANT
GLOVE EXAM NITRILE EXT CUFF MD (GLOVE) ×2 IMPLANT
GLOVE SURG SS PI 7.0 STRL IVOR (GLOVE) ×2 IMPLANT
GOWN STRL REUS W/ TWL LRG LVL3 (GOWN DISPOSABLE) ×1 IMPLANT
GOWN STRL REUS W/ TWL XL LVL3 (GOWN DISPOSABLE) ×1 IMPLANT
GOWN STRL REUS W/TWL LRG LVL3 (GOWN DISPOSABLE) ×3
GOWN STRL REUS W/TWL XL LVL3 (GOWN DISPOSABLE) ×3
IV CATH PLACEMENT UNIT 16 GA (IV SOLUTION) IMPLANT
IV KIT MINILOC 20X1 SAFETY (NEEDLE) IMPLANT
KIT CVR 48X5XPRB PLUP LF (MISCELLANEOUS) ×1 IMPLANT
KIT PORT POWER 8FR ISP CVUE (Catheter) ×3 IMPLANT
NDL BLUNT 17GA (NEEDLE) IMPLANT
NEEDLE BLUNT 17GA (NEEDLE) IMPLANT
NEEDLE HYPO 22GX1.5 SAFETY (NEEDLE) ×3 IMPLANT
NEEDLE HYPO 25X1 1.5 SAFETY (NEEDLE) ×3 IMPLANT
PACK BASIN DAY SURGERY FS (CUSTOM PROCEDURE TRAY) ×3 IMPLANT
PENCIL BUTTON HOLSTER BLD 10FT (ELECTRODE) ×3 IMPLANT
SET SHEATH INTRODUCER 10FR (MISCELLANEOUS) IMPLANT
SHEATH COOK PEEL AWAY SET 9F (SHEATH) IMPLANT
SLEEVE SCD COMPRESS KNEE MED (MISCELLANEOUS) ×3 IMPLANT
SPONGE GAUZE 4X4 12PLY STER LF (GAUZE/BANDAGES/DRESSINGS) IMPLANT
STRIP CLOSURE SKIN 1/2X4 (GAUZE/BANDAGES/DRESSINGS) IMPLANT
SUT MNCRL AB 4-0 PS2 18 (SUTURE) ×3 IMPLANT
SUT PROLENE 2 0 CT2 30 (SUTURE) ×5 IMPLANT
SUT VICRYL 3-0 CR8 SH (SUTURE) ×3 IMPLANT
SYR 5ML LUER SLIP (SYRINGE) ×3 IMPLANT
SYRINGE 10CC LL (SYRINGE) ×3 IMPLANT
TOWEL OR 17X24 6PK STRL BLUE (TOWEL DISPOSABLE) ×4 IMPLANT
TOWEL OR NON WOVEN STRL DISP B (DISPOSABLE) ×1 IMPLANT
TUBE CONNECTING 20'X1/4 (TUBING)
TUBE CONNECTING 20X1/4 (TUBING) IMPLANT
YANKAUER SUCT BULB TIP NO VENT (SUCTIONS) IMPLANT

## 2016-03-02 NOTE — Transfer of Care (Signed)
Immediate Anesthesia Transfer of Care Note  Patient: Kiara Spencer  Procedure(s) Performed: Procedure(s) with comments: INSERTION PORT-A-CATH WITH ULTRASOUND (Right) - IJ  Patient Location: PACU  Anesthesia Type:General  Level of Consciousness: awake, sedated and patient cooperative  Airway & Oxygen Therapy: Patient Spontanous Breathing and Patient connected to face mask oxygen  Post-op Assessment: Report given to RN and Post -op Vital signs reviewed and stable  Post vital signs: Reviewed and stable  Last Vitals:  Filed Vitals:   03/02/16 0940  BP: 99/56  Pulse: 107  Temp: 36.8 C  Resp: 18    Complications: No apparent anesthesia complications

## 2016-03-02 NOTE — Telephone Encounter (Signed)
lv for pt regarding to april appt.Kiara KitchenMarland KitchenMarland Spencer

## 2016-03-02 NOTE — Op Note (Signed)
Patient Name:           Kiara Spencer   Date of Surgery:        03/02/2016  Pre op Diagnosis:      Cancer right breast, upper outer quadrant axillary tail                                       Right axillary lymph node metastasis  Post op Diagnosis:    Same  Procedure:                 Insertion of 8 French power port clear view tunneled venous vascular access device into the right internal jugular vein.                                      Use of fluoroscopy for guidance and positioning                                      Use of ultrasound for venipuncture and guidance  Surgeon:                     Edsel Petrin. Dalbert Batman, M.D., FACS  Assistant:                      OR staff  Operative Indications:    This is a 45 year old female who was recently evaluated for her first postop visit following bilateral nipple sparing mastectomy and right axillary sentinel lymph node biopsy and complete right axillary lymph node dissection. Date of surgery was February 05, 2016. Dr. Marla Roe is her plastic surgeon Dr. Lindi Adie is her oncologist. Dr. Lisbeth Renshaw is her radiation oncologist.     She was initially evaluated with a palpable mass in the axillary tail of the right breast. Family history was positive for mother with breast cancer, maternal aunt with breast cancer, maternal cousin with breast cancer. Genetics was negative.      Biopsy showed invasive ductal carcinoma, hormone receptor positive, HER-2 negative. She had a second density in the upper-outer quadrant somewhat more anterior which was biopsied and showed a complex sclerosing lesion. Lengthy discussion and consultation was performed. The patient was strongly motivated for bilateral nipple sparing mastectomies.    On February 05, 2016 she underwent right nipple sparing mastectomy with sentinel node biopsy, F.S.,  and complete right axillary lymph node dissection and left breast prophylactic nipple sparing mastectomy. Final pathology showed metastatic  carcinoma in 1 out of 11 lymph nodes. The tumor in the axilla tail was 3 cm. She had invasive cancer focally present at the posterior margin within the axilla tail but the posterior margin is the axillary space and so there is no further surgery to be done. The tumor came cleanly off of the muscle and the skin. She is being followed closely by plastic surgery. She still has 1 drain in place.     Discussed in breast conference post op. . Saw Dr. Lindi Adie today. He recommends Port-A-Cath placement, adjuvant chemotherapy and then later on adjuvant radiation therapy. She has agreed to all of this. She will be scheduled for Port-A-Cath insertion with ultrasound. I will try to place this on the left side but either side should  be reasonable. I discussed the indications, details, techniques, numerous risk of Port-A-Cath insertion with her. She is aware the risks of bleeding, infection, technical problems requiring contralateral placement, port malfunction requiring revision, arm swelling with thrombosis. Catheter embolization. Pneumothorax requiring chest tube. She understands the port will be removed and later on she'll need radiation therapy later. All of her questions are answered. She understands all of these issues. She agrees with this plan. We will initially scheduling female. She is scheduled to start chemotherapy on April 10.  Dr. Elisabeth Cara requested that I remove the final drain on the right side and this is agreeable to the patient  Operative Findings:       I was unable to get good blood return following 3 attempts at left subclavian venipuncture.  Using ultrasound I was able to identify the right internal jugular vein and I got good blood return and threaded the wire but it was kinked back on itself and did not appear to be properly placed.  I then removed the wire completely and then performed a right internal jugular venipuncture a second time.  I got one tiny bubble of air but  ultimately got good blood return and threaded the wire into the superior vena cava.  Fluoroscopy was used to confirm position.  At the end of the case I had excellent blood return and the catheter flushed easily.  Fluoroscopy in the operating room showed good positioning of the catheter and port and I looked at both lung apices and did not see any pneumothorax.  Procedure in Detail:          Following the induction of general LMA anesthesia a surgical timeout was performed.  Intravenous antibiotics were given.  I examined the drain site in the right lateral lower chest wall.  I cut the suture and remove the drain uneventfully and placed a bandage.      The patient was positioned with a roll behind her shoulders and her arms at her side.  The neck and chest was prepped and draped in a sterile fashion.  0.5% Marcaine with epinephrine was used as a local infiltration anesthetic.    A left subclavian venipuncture was attempted and after 3 passes I got no blood and so I abandoned this approach.  I then repositioned the patient's head and neck and brought the ultrasound to the operative field.  I could easily see the carotid artery and the internal jugular vein.  Using the ultrasound as a guide I performed a right internal jugular venipuncture and got good blood return and threaded the wire.  Fluoroscopy showed the wire had turned back on itself and when I tried to straighten this out it would not straighten out and so I removed the wire completely.  Using ultrasound I again performed a right internal jugular venipuncture.  I got one tiny bubble of air but on redirection I got excellent blood return and threaded the catheter into the superior vena cava under fluoroscopic guidance.  A small incision was made at the wire insertion site.  Made a transverse incision about 2 cm below the right clavicle.  I created a subcutaneous pocket.  Using a tunneling device I passed the catheter from the right neck incision to the  right infraclavicular port pocket site.  I had drawn a template on the chest wall using fluoroscopy to measure where I wanted the tip of the catheter to be and with everything in place I measured the catheter and cut it 22  cm in length.  I secured the port to the catheter with the locking device and flushed the port and catheter with heparinized saline.  The port was ultimately sutured to the pectoralis fascia with 2 Prolene sutures, one on the medial and one on the lateral side.  I passed the dilator and peel-away sheath  down over the wire easily.  The dilator and wire were removed.  The catheter was threaded easily and the peel-away sheath removed.  I had good but blood return and the catheter flushed easily.  I then flushed the catheter with concentrated heparin.  Fluoroscopy showed that the catheter tip appeared to be well positioned in the superior vena cava and the did not appear to be any deformity along the catheter.  There is no apparent pneumothorax at either the left or the right lung apex.    The subcutaneous tissue was closed with 3-0 Vicryl sutures.  The skin incisions were closed with subcuticular 4-0 Monocryl and Dermabond.  The patient tolerated the procedure well was taken to PACU in stable condition.  EBL 20 mL.  Counts correct.  Complications none.  Chest x-ray is planned in the PACU.     Edsel Petrin. Dalbert Batman, M.D., FACS General and Minimally Invasive Surgery Breast and Colorectal Surgery  03/02/2016 12:06 PM

## 2016-03-02 NOTE — Anesthesia Preprocedure Evaluation (Signed)
Anesthesia Evaluation  Patient identified by MRN, date of birth, ID band Patient awake    Reviewed: Allergy & Precautions, NPO status , Patient's Chart, lab work & pertinent test results  History of Anesthesia Complications Negative for: history of anesthetic complications  Airway Mallampati: II  TM Distance: >3 FB Neck ROM: Full    Dental no notable dental hx. (+) Dental Advisory Given   Pulmonary neg pulmonary ROS,    Pulmonary exam normal breath sounds clear to auscultation       Cardiovascular negative cardio ROS Normal cardiovascular exam Rhythm:Regular Rate:Normal     Neuro/Psych  Headaches, negative psych ROS   GI/Hepatic negative GI ROS, Neg liver ROS,   Endo/Other  negative endocrine ROS  Renal/GU negative Renal ROS  negative genitourinary   Musculoskeletal negative musculoskeletal ROS (+)   Abdominal   Peds negative pediatric ROS (+)  Hematology negative hematology ROS (+)   Anesthesia Other Findings Breast ca  Reproductive/Obstetrics negative OB ROS                             Anesthesia Physical Anesthesia Plan  ASA: II  Anesthesia Plan: General   Post-op Pain Management:    Induction: Intravenous  Airway Management Planned: LMA and Oral ETT  Additional Equipment:   Intra-op Plan:   Post-operative Plan: Extubation in OR  Informed Consent: I have reviewed the patients History and Physical, chart, labs and discussed the procedure including the risks, benefits and alternatives for the proposed anesthesia with the patient or authorized representative who has indicated his/her understanding and acceptance.   Dental advisory given  Plan Discussed with: CRNA  Anesthesia Plan Comments:         Anesthesia Quick Evaluation

## 2016-03-02 NOTE — Interval H&P Note (Signed)
History and Physical Interval Note:  03/02/2016 10:54 AM  Linda Hedges  has presented today for surgery, with the diagnosis of BREAST CANCER  The various methods of treatment have been discussed with the patient and family. After consideration of risks, benefits and other options for treatment, the patient has consented to  Procedure(s): INSERTION PORT-A-CATH WITH ULTRASOUND (N/A) as a surgical intervention .  The patient's history has been reviewed, patient examined, no change in status, stable for surgery.  I have reviewed the patient's chart and labs.  Questions were answered to the patient's satisfaction.     Adin Hector

## 2016-03-02 NOTE — Discharge Instructions (Signed)

## 2016-03-02 NOTE — Anesthesia Procedure Notes (Signed)
Procedure Name: LMA Insertion Date/Time: 03/02/2016 11:06 AM Performed by: Lyndee Leo Pre-anesthesia Checklist: Patient identified, Emergency Drugs available, Suction available and Patient being monitored Patient Re-evaluated:Patient Re-evaluated prior to inductionOxygen Delivery Method: Circle System Utilized Preoxygenation: Pre-oxygenation with 100% oxygen Intubation Type: IV induction Ventilation: Mask ventilation without difficulty LMA: LMA inserted LMA Size: 4.0 Number of attempts: 1 Airway Equipment and Method: Bite block Placement Confirmation: positive ETCO2 Tube secured with: Tape Dental Injury: Teeth and Oropharynx as per pre-operative assessment

## 2016-03-03 ENCOUNTER — Encounter (HOSPITAL_BASED_OUTPATIENT_CLINIC_OR_DEPARTMENT_OTHER): Payer: Self-pay | Admitting: General Surgery

## 2016-03-08 ENCOUNTER — Ambulatory Visit (HOSPITAL_COMMUNITY)
Admission: RE | Admit: 2016-03-08 | Discharge: 2016-03-08 | Disposition: A | Discharge status: Home / Self Care | Payer: BLUE CROSS/BLUE SHIELD | Source: Ambulatory Visit | Attending: Obstetrics and Gynecology | Admitting: Obstetrics and Gynecology

## 2016-03-08 ENCOUNTER — Ambulatory Visit (HOSPITAL_BASED_OUTPATIENT_CLINIC_OR_DEPARTMENT_OTHER)
Admission: RE | Admit: 2016-03-08 | Discharge: 2016-03-08 | Disposition: A | Discharge status: Home / Self Care | Payer: BLUE CROSS/BLUE SHIELD | Source: Ambulatory Visit | Attending: Internal Medicine | Admitting: Internal Medicine

## 2016-03-08 VITALS — BP 98/66 | HR 66 | Wt 148.5 lb

## 2016-03-08 DIAGNOSIS — Z09 Encounter for follow-up examination after completed treatment for conditions other than malignant neoplasm: Secondary | ICD-10-CM | POA: Diagnosis present

## 2016-03-08 DIAGNOSIS — C50411 Malignant neoplasm of upper-outer quadrant of right female breast: Secondary | ICD-10-CM

## 2016-03-08 LAB — ECHOCARDIOGRAM COMPLETE: WEIGHTICAEL: 2376 [oz_av]

## 2016-03-08 NOTE — Patient Instructions (Signed)
Follow up in 2 months with an ECHO and Dr Haroldine Laws

## 2016-03-08 NOTE — Progress Notes (Signed)
Advanced Heart Failure Medication Review by a Pharmacist  Does the patient  feel that his/her medications are working for him/her?  yes  Has the patient been experiencing any side effects to the medications prescribed?  no  Does the patient measure his/her own blood pressure or blood glucose at home?  no   Does the patient have any problems obtaining medications due to transportation or finances?   no  Understanding of regimen: good Understanding of indications: good Potential of compliance: good Patient understands to avoid NSAIDs. Patient understands to avoid decongestants.  Issues to address at subsequent visits: None   Pharmacist comments:  Ms. Bohley is a pleasant 45 yo F presenting without a medication list but with good recall of her regimen. Many of the medications prescribed will begin after she initiates chemo. No medication-related questions or concerns were identified at this time.   Ruta Hinds. Velva Harman, PharmD, BCPS, CPP Clinical Pharmacist Pager: [phone] Phone: [phone] 03/08/2016 3:31 PM      Time with patient: 8 minutes Preparation and documentation time: 2 minutes Total time: 10 minutes

## 2016-03-08 NOTE — Progress Notes (Signed)
  Echocardiogram 2D Echocardiogram has been performed.  Kiara Spencer 03/08/2016, 3:47 PM

## 2016-03-08 NOTE — Progress Notes (Signed)
Patient ID: Kiara Spencer, female   DOB: March 26, 1971, 45 y.o.   MRN: [id number]   CARDIO-ONCOLOGY CLINIC CONSULT NOTE  Referring Physician: Dr Kiara Spencer Primary Care: Primary Cardiologist: None Radiologist Oncologist: Dr Kiara Spencer Plastic Surgeon: Dr Kiara Spencer   HPI: Kiara Spencer is a 45 year old with a history right breast cancer Stage 2b (T2, N1, MO) 11/2016 , S/P bilateral mastectomies with reconstruction 02/05/2016 referred to [facility] by Dr Kiara Spencer.   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 Mastectomy: IDC Grade 1, 3 cm, 1/11 LN positive,with IG DCIS 0.1 cm margin, Diffuse LVI, cancer at axill tail. T2N1 (Stage 2B); Left mastectomy: Fibrocystic ch, Mammaprint high risk           Denies previous cardiac history. Works as Kiara Spencer for [employer]. Plan for adriamycin per Dr Kiara Spencer on April 10th followed by XRT and anti-estrogen therapy. . Mild dyspnea with exertion. She has received a couple of doses Iron. Appetite ok.   ECHO 03/08/2016: EF 55%. Tissue doppler and strain no performed.    Review of Systems: [y] = yes, '[ ]'  = no   General: Weight gain '[ ]' ; Weight loss '[ ]' ; Anorexia '[ ]' ; Fatigue [ Y]; Fever '[ ]' ; Chills '[ ]' ; Weakness [Y ]  Cardiac: Chest pain/pressure '[ ]' ; Resting SOB '[ ]' ; Exertional SOB '[ ]' ; Orthopnea '[ ]' ; Pedal Edema '[ ]' ; Palpitations '[ ]' ; Syncope '[ ]' ; Presyncope '[ ]' ; Paroxysmal nocturnal dyspnea'[ ]'   Pulmonary: Cough '[ ]' ; Wheezing'[ ]' ; Hemoptysis'[ ]' ; Sputum '[ ]' ; Snoring '[ ]'   GI:  Vomiting'[ ]' ; Dysphagia'[ ]' ; Melena'[ ]' ; Hematochezia '[ ]' ; Heartburn'[ ]' ; Abdominal pain '[ ]' ; Constipation '[ ]' ; Diarrhea '[ ]' ; BRBPR '[ ]'   GU: Hematuria'[ ]' ; Dysuria '[ ]' ; Nocturia'[ ]'   Vascular: Pain in legs with walking '[ ]' ; Pain in feet with lying flat '[ ]' ; Non-healing sores '[ ]' ; Stroke '[ ]' ; TIA '[ ]' ; Slurred speech '[ ]' ;  Neuro: Headaches'[ ]' ; Vertigo'[ ]' ; Seizures'[ ]' ; Paresthesias'[ ]' ;Blurred vision '[ ]' ; Diplopia '[ ]' ; Vision changes '[ ]'   Ortho/Skin: Arthritis '[ ]' ; Joint pain '[ ]' ; Muscle pain '[ ]' ; Joint swelling '[ ]' ; Back Pain '[ ]' ; Rash '[ ]'   Psych: Depression'[ ]' ; Anxiety'[ ]'   Heme: Bleeding problems '[ ]' ; Clotting disorders '[ ]' ; Anemia '[ ]'   Endocrine: Diabetes '[ ]' ; Thyroid dysfunction'[ ]'    Past Medical History  Diagnosis Date  . Breast cancer of upper-outer quadrant of right female breast ([place]) 12/02/2015  . Breast cancer ([place])   . Headache     hx migraines    Current Outpatient Prescriptions  Medication Sig Dispense Refill  . Ascorbic Acid (VITAMIN C) 100 MG tablet Take 100 mg by mouth daily.    [name] dexamethasone (DECADRON) 4 MG tablet Take 1 tablet (4 mg total) by mouth daily. Take 1 tab day after chemo X 2 days 15 tablet 1  . diazepam (VALIUM) 2 MG tablet Take 1 tablet (2 mg total) by mouth every 12 (twelve) hours. 30 tablet 0  .  HYDROcodone-acetaminophen (NORCO) 5-325 MG tablet Take 1-2 tablets by mouth every 6 (six) hours as needed for moderate pain or severe pain. 30 tablet 0  . ibuprofen (ADVIL,MOTRIN) 200 MG tablet Take 200 mg by mouth every 6 (six) hours as needed.    . lidocaine-prilocaine (EMLA) cream Apply to affected area once 30 g 3  . LORazepam (ATIVAN) 0.5 MG tablet Take 1 tablet (0.5 mg total) by mouth at bedtime. 30 tablet 0  . ondansetron (ZOFRAN-ODT) 4 MG disintegrating tablet Take 1 tablet (4 mg total) by mouth every 6 (six) hours as needed for nausea. (Patient not taking: Reported on 02/24/2016) 20 tablet 0  . oxyCODONE-acetaminophen (PERCOCET/ROXICET) 5-325 MG tablet Take 1 tablet  by mouth every 4 (four) hours as needed for severe pain.    . polyethylene glycol (MIRALAX / GLYCOLAX) packet Take 17 g by mouth daily as needed for mild constipation. 14 each 0  . prochlorperazine (COMPAZINE) 10 MG tablet Take 1 tablet (10 mg total) by mouth every 6 (six) hours as needed (Nausea or vomiting). 30 tablet 1   No current facility-administered medications for this encounter.    Allergies  Allergen Reactions  . Cephalexin Rash      Social History   Social History  . Marital Status: Married    Spouse Name: N/A  . Number of Children: N/A  . Years of Education: N/A   Occupational History  . Not on file.   Social History Main Topics  . Smoking status: Never Smoker   . Smokeless tobacco: Never Used  . Alcohol Use: No  . Drug Use: No  . Sexual Activity: Yes    Birth Control/ Protection: None   Other Topics Concern  . Not on file   Social History Narrative      Family History  Problem Relation Age of Onset  . Breast cancer Mother 72    s/p partial mastectomy  . Colon polyps Mother     3 polyps total  . Breast cancer Maternal Aunt 64  . Leukemia Maternal Uncle 14  . Heart attack Paternal Aunt   . Heart attack Maternal Grandmother   . Heart attack Maternal Grandfather   . Lupus Maternal Aunt   . Prostate cancer Maternal Uncle 68  . Prostate cancer Maternal Uncle 72  . Prostate cancer Maternal Uncle   . Heart attack Maternal Uncle   . Prostate cancer Maternal Uncle     dx. 97s   . Breast cancer Cousin     dx. early 66s or younger; may have had genetic testing  . Cancer Cousin     "rare" cancer, dx. 74s  . Pancreatic cancer Cousin     dx. 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 Throat cancer Paternal Uncle     dx. early 68s  . Cancer Other     unspecified type    Filed Vitals:   03/08/16 1522  BP: 98/66  Pulse: 66  Weight: 148 lb 8 oz (67.359 kg)  SpO2: 96%    PHYSICAL EXAM: General:  Well  appearing. No respiratory difficulty HEENT: normal Neck: supple. no JVD. Carotids 2+ bilat; no bruits. No lymphadenopathy or thryomegaly appreciated. Cor: s/p bilateral mastectomies with expanders in place. PMI nondisplaced. Regular rate & rhythm. No rubs, gallops or murmurs. Lungs: clear Abdomen: soft, nontender, nondistended. No hepatosplenomegaly. No bruits or masses. Good bowel sounds. Extremities: no cyanosis, clubbing, rash, edema Neuro: alert & oriented x 3, cranial  nerves grossly intact. moves all 4 extremities w/o difficulty. Affect pleasant.   ASSESSMENT & PLAN:  1. R breast Cancer-Stage IIB T2, N1, M0 . ER+ PR - HER-2 - Planning for adriamycin starting April 10th. Discussed there is risk of chemo induced cardiomyopathy. Risk overall low but we will plan to follow closely. Todays ECHO was discussed and reviewed by Dr Haroldine Laws today.  Plan to repeat ECHO in 2 months.   Amy Clegg NP-C  3:58 PM   Patient seen and examined with Darrick Grinder, NP. We discussed all aspects of the encounter. I agree with the assessment and plan as stated above.   Explained risk and dose-dependent nature of Adriamycin cardiotoxicity and role of Cardio-oncology clinic at length. Echo images reviewed personally. All parameters stable. Reviewed signs and symptoms of HF to look for.  Follow-up with echo in 2 months. Then 2 months after that.  Epiphany Seltzer,MD 10:15 PM

## 2016-03-10 ENCOUNTER — Encounter: Payer: BLUE CROSS/BLUE SHIELD | Admitting: Physical Therapy

## 2016-03-11 ENCOUNTER — Other Ambulatory Visit: Payer: Self-pay

## 2016-03-12 ENCOUNTER — Ambulatory Visit: Payer: BLUE CROSS/BLUE SHIELD | Admitting: Hematology and Oncology

## 2016-03-12 ENCOUNTER — Other Ambulatory Visit (HOSPITAL_COMMUNITY): Payer: BLUE CROSS/BLUE SHIELD

## 2016-03-15 ENCOUNTER — Ambulatory Visit (HOSPITAL_BASED_OUTPATIENT_CLINIC_OR_DEPARTMENT_OTHER): Payer: BLUE CROSS/BLUE SHIELD | Admitting: Nurse Practitioner

## 2016-03-15 ENCOUNTER — Telehealth: Payer: Self-pay | Admitting: Nurse Practitioner

## 2016-03-15 ENCOUNTER — Ambulatory Visit (HOSPITAL_BASED_OUTPATIENT_CLINIC_OR_DEPARTMENT_OTHER): Payer: BLUE CROSS/BLUE SHIELD

## 2016-03-15 ENCOUNTER — Other Ambulatory Visit: Payer: Self-pay

## 2016-03-15 ENCOUNTER — Other Ambulatory Visit: Payer: Self-pay | Admitting: *Deleted

## 2016-03-15 ENCOUNTER — Other Ambulatory Visit (HOSPITAL_BASED_OUTPATIENT_CLINIC_OR_DEPARTMENT_OTHER): Payer: BLUE CROSS/BLUE SHIELD

## 2016-03-15 VITALS — BP 109/66 | HR 64 | Temp 98.7°F | Resp 18

## 2016-03-15 DIAGNOSIS — C50411 Malignant neoplasm of upper-outer quadrant of right female breast: Secondary | ICD-10-CM

## 2016-03-15 DIAGNOSIS — Z5111 Encounter for antineoplastic chemotherapy: Secondary | ICD-10-CM | POA: Diagnosis not present

## 2016-03-15 DIAGNOSIS — T7840XA Allergy, unspecified, initial encounter: Secondary | ICD-10-CM

## 2016-03-15 DIAGNOSIS — T451X5A Adverse effect of antineoplastic and immunosuppressive drugs, initial encounter: Secondary | ICD-10-CM | POA: Diagnosis not present

## 2016-03-15 LAB — CBC WITH DIFFERENTIAL/PLATELET
BASO%: 1 % (ref 0.0–2.0)
Basophils Absolute: 0 10*3/uL (ref 0.0–0.1)
EOS%: 4 % (ref 0.0–7.0)
Eosinophils Absolute: 0.1 10*3/uL (ref 0.0–0.5)
HCT: 36.9 % (ref 34.8–46.6)
HGB: 11.7 g/dL (ref 11.6–15.9)
LYMPH%: 33.1 % (ref 14.0–49.7)
MCH: 29.4 pg (ref 25.1–34.0)
MCHC: 31.7 g/dL (ref 31.5–36.0)
MCV: 92.6 fL (ref 79.5–101.0)
MONO#: 0.3 10*3/uL (ref 0.1–0.9)
MONO%: 7.9 % (ref 0.0–14.0)
NEUT%: 54 % (ref 38.4–76.8)
NEUTROS ABS: 2 10*3/uL (ref 1.5–6.5)
Platelets: 230 10*3/uL (ref 145–400)
RBC: 3.99 10*6/uL (ref 3.70–5.45)
RDW: 16.3 % — ABNORMAL HIGH (ref 11.2–14.5)
WBC: 3.6 10*3/uL — AB (ref 3.9–10.3)
lymph#: 1.2 10*3/uL (ref 0.9–3.3)

## 2016-03-15 LAB — COMPREHENSIVE METABOLIC PANEL
ALT: 20 U/L (ref 0–55)
ANION GAP: 6 meq/L (ref 3–11)
AST: 18 U/L (ref 5–34)
Albumin: 3.8 g/dL (ref 3.5–5.0)
Alkaline Phosphatase: 57 U/L (ref 40–150)
BILIRUBIN TOTAL: 0.34 mg/dL (ref 0.20–1.20)
BUN: 12.3 mg/dL (ref 7.0–26.0)
CHLORIDE: 108 meq/L (ref 98–109)
CO2: 28 meq/L (ref 22–29)
CREATININE: 0.8 mg/dL (ref 0.6–1.1)
Calcium: 9.6 mg/dL (ref 8.4–10.4)
GLUCOSE: 71 mg/dL (ref 70–140)
Potassium: 4.4 mEq/L (ref 3.5–5.1)
SODIUM: 141 meq/L (ref 136–145)
TOTAL PROTEIN: 7.3 g/dL (ref 6.4–8.3)

## 2016-03-15 MED ORDER — DOXORUBICIN HCL CHEMO IV INJECTION 2 MG/ML
60.0000 mg/m2 | Freq: Once | INTRAVENOUS | Status: AC
  Administered 2016-03-15: 104 mg via INTRAVENOUS
  Filled 2016-03-15: qty 52

## 2016-03-15 MED ORDER — SODIUM CHLORIDE 0.9% FLUSH
10.0000 mL | INTRAVENOUS | Status: DC | PRN
  Administered 2016-03-15: 10 mL
  Filled 2016-03-15: qty 10

## 2016-03-15 MED ORDER — SODIUM CHLORIDE 0.9 % IV SOLN
Freq: Once | INTRAVENOUS | Status: AC
  Administered 2016-03-15: 14:00:00 via INTRAVENOUS
  Filled 2016-03-15: qty 5

## 2016-03-15 MED ORDER — PEGFILGRASTIM 6 MG/0.6ML ~~LOC~~ PSKT
6.0000 mg | PREFILLED_SYRINGE | Freq: Once | SUBCUTANEOUS | Status: AC
  Administered 2016-03-15: 6 mg via SUBCUTANEOUS
  Filled 2016-03-15: qty 0.6

## 2016-03-15 MED ORDER — HEPARIN SOD (PORK) LOCK FLUSH 100 UNIT/ML IV SOLN
500.0000 [IU] | Freq: Once | INTRAVENOUS | Status: AC | PRN
Start: 2016-03-15 — End: 2016-03-15
  Administered 2016-03-15: 500 [IU]
  Filled 2016-03-15: qty 5

## 2016-03-15 MED ORDER — FAMOTIDINE IN NACL 20-0.9 MG/50ML-% IV SOLN
20.0000 mg | Freq: Once | INTRAVENOUS | Status: AC
  Administered 2016-03-15: 20 mg via INTRAVENOUS

## 2016-03-15 MED ORDER — DIPHENHYDRAMINE HCL 50 MG/ML IJ SOLN
25.0000 mg | Freq: Once | INTRAMUSCULAR | Status: AC
  Administered 2016-03-15: 25 mg via INTRAVENOUS

## 2016-03-15 MED ORDER — PALONOSETRON HCL INJECTION 0.25 MG/5ML
0.2500 mg | Freq: Once | INTRAVENOUS | Status: AC
  Administered 2016-03-15: 0.25 mg via INTRAVENOUS

## 2016-03-15 MED ORDER — SODIUM CHLORIDE 0.9 % IV SOLN
600.0000 mg/m2 | Freq: Once | INTRAVENOUS | Status: AC
  Administered 2016-03-15: 1040 mg via INTRAVENOUS
  Filled 2016-03-15: qty 52

## 2016-03-15 MED ORDER — SODIUM CHLORIDE 0.9 % IV SOLN
Freq: Once | INTRAVENOUS | Status: AC
  Administered 2016-03-15: 13:00:00 via INTRAVENOUS

## 2016-03-15 MED ORDER — PALONOSETRON HCL INJECTION 0.25 MG/5ML
INTRAVENOUS | Status: AC
Start: 2016-03-15 — End: 2016-03-15
  Filled 2016-03-15: qty 5

## 2016-03-15 NOTE — Patient Instructions (Addendum)
[facility] Discharge Instructions for Patients Receiving Chemotherapy  Today you received the following chemotherapy agents Adriamycin, Cytoxan and Neulasta. To help prevent nausea and vomiting after your treatment, we encourage you to take your nausea medication as directed for as needed BUT NO ZOFRAN FOR 3 DAYS, TAKE COMPAZINE DURING THAT TIME.  If you develop nausea and vomiting that is not controlled by your nausea medication, call the clinic.   BELOW ARE SYMPTOMS THAT SHOULD BE REPORTED IMMEDIATELY:  *FEVER GREATER THAN 100.5 F  *CHILLS WITH OR WITHOUT FEVER  NAUSEA AND VOMITING THAT IS NOT CONTROLLED WITH YOUR NAUSEA MEDICATION  *UNUSUAL SHORTNESS OF BREATH  *UNUSUAL BRUISING OR BLEEDING  TENDERNESS IN MOUTH AND THROAT WITH OR WITHOUT PRESENCE OF ULCERS  *URINARY PROBLEMS  *BOWEL PROBLEMS  UNUSUAL RASH Items with * indicate a potential emergency and should be followed up as soon as possible.  Feel free to call the clinic you have any questions or concerns. The clinic phone number is [phone].  Please show the [facility] at check-in to the Emergency Department and triage nurse.   Doxorubicin injection What is this medicine? DOXORUBICIN (dox oh ROO bi sin) is a chemotherapy drug. It is used to treat many kinds of cancer like Hodgkin's disease, leukemia, non-Hodgkin's lymphoma, neuroblastoma, sarcoma, and Wilms' tumor. It is also used to treat bladder cancer, breast cancer, lung cancer, ovarian cancer, stomach cancer, and thyroid cancer. This medicine may be used for other purposes; ask your health care provider or pharmacist if you have questions. What should I tell my health care provider before I take this medicine? They need to know if you have any of these conditions: -blood disorders -heart disease, recent heart attack -infection (especially a virus infection such as chickenpox, cold sores, or herpes) -irregular heartbeat -liver  disease -recent or ongoing radiation therapy -an unusual or allergic reaction to doxorubicin, other chemotherapy agents, other medicines, foods, dyes, or preservatives -pregnant or trying to get pregnant -breast-feeding How should I use this medicine? This drug is given as an infusion into a vein. It is administered in a hospital or clinic by a specially trained health care professional. If you have pain, swelling, burning or any unusual feeling around the site of your injection, tell your health care professional right away. Talk to your pediatrician regarding the use of this medicine in children. Special care may be needed. Overdosage: If you think you have taken too much of this medicine contact a poison control center or emergency room at once. NOTE: This medicine is only for you. Do not share this medicine with others. What if I miss a dose? It is important not to miss your dose. Call your doctor or health care professional if you are unable to keep an appointment. What may interact with this medicine? Do not take this medicine with any of the following medications: -cisapride -droperidol -halofantrine -pimozide -zidovudine This medicine may also interact with the following medications: -chloroquine -chlorpromazine -clarithromycin -cyclophosphamide -cyclosporine -erythromycin -medicines for depression, anxiety, or psychotic disturbances -medicines for irregular heart beat like amiodarone, bepridil, dofetilide, encainide, flecainide, propafenone, quinidine -medicines for seizures like ethotoin, fosphenytoin, phenytoin -medicines for nausea, vomiting like dolasetron, ondansetron, palonosetron -medicines to increase blood counts like filgrastim, pegfilgrastim, sargramostim -methadone -methotrexate -pentamidine -progesterone -vaccines -verapamil Talk to your doctor or health care professional before taking any of these  medicines: -acetaminophen -aspirin -ibuprofen -ketoprofen -naproxen This list may not describe all possible interactions. Give your health care provider a list  of all the medicines, herbs, non-prescription drugs, or dietary supplements you use. Also tell them if you smoke, drink alcohol, or use illegal drugs. Some items may interact with your medicine. What should I watch for while using this medicine? Your condition will be monitored carefully while you are receiving this medicine. You will need important blood work done while you are taking this medicine. This drug may make you feel generally unwell. This is not uncommon, as chemotherapy can affect healthy cells as well as cancer cells. Report any side effects. Continue your course of treatment even though you feel ill unless your doctor tells you to stop. Your urine may turn red for a few days after your dose. This is not blood. If your urine is dark or brown, call your doctor. In some cases, you may be given additional medicines to help with side effects. Follow all directions for their use. Call your doctor or health care professional for advice if you get a fever, chills or sore throat, or other symptoms of a cold or flu. Do not treat yourself. This drug decreases your body's ability to fight infections. Try to avoid being around people who are sick. This medicine may increase your risk to bruise or bleed. Call your doctor or health care professional if you notice any unusual bleeding. Be careful brushing and flossing your teeth or using a toothpick because you may get an infection or bleed more easily. If you have any dental work done, tell your dentist you are receiving this medicine. Avoid taking products that contain aspirin, acetaminophen, ibuprofen, naproxen, or ketoprofen unless instructed by your doctor. These medicines may hide a fever. Men and women of childbearing age should use effective birth control methods while using taking this  medicine. Do not become pregnant while taking this medicine. There is a potential for serious side effects to an unborn child. Talk to your health care professional or pharmacist for more information. Do not breast-feed an infant while taking this medicine. Do not let others touch your urine or other body fluids for 5 days after each treatment with this medicine. Caregivers should wear latex gloves to avoid touching body fluids during this time. There is a maximum amount of this medicine you should receive throughout your life. The amount depends on the medical condition being treated and your overall health. Your doctor will watch how much of this medicine you receive in your lifetime. Tell your doctor if you have taken this medicine before. What side effects may I notice from receiving this medicine? Side effects that you should report to your doctor or health care professional as soon as possible: -allergic reactions like skin rash, itching or hives, swelling of the face, lips, or tongue -low blood counts - this medicine may decrease the number of white blood cells, red blood cells and platelets. You may be at increased risk for infections and bleeding. -signs of infection - fever or chills, cough, sore throat, pain or difficulty passing urine -signs of decreased platelets or bleeding - bruising, pinpoint red spots on the skin, black, tarry stools, blood in the urine -signs of decreased red blood cells - unusually weak or tired, fainting spells, lightheadedness -breathing problems -chest pain -fast, irregular heartbeat -mouth sores -nausea, vomiting -pain, swelling, redness at site where injected -pain, tingling, numbness in the hands or feet -swelling of ankles, feet, or hands -unusual bleeding or bruising Side effects that usually do not require medical attention (report to your doctor or health care professional  if they continue or are bothersome): -diarrhea -facial flushing -hair  loss -loss of appetite -missed menstrual periods -nail discoloration or damage -red or watery eyes -red colored urine -stomach upset This list may not describe all possible side effects. Call your doctor for medical advice about side effects. You may report side effects to FDA at 1-800-FDA-1088. Where should I keep my medicine? This drug is given in a hospital or clinic and will not be stored at home. NOTE: This sheet is a summary. It may not cover all possible information. If you have questions about this medicine, talk to your doctor, pharmacist, or health care provider.    2016, Elsevier/Gold Standard. (2013-03-20 09:54:34)   Cyclophosphamide injection What is this medicine? CYCLOPHOSPHAMIDE (sye kloe FOSS fa mide) is a chemotherapy drug. It slows the growth of cancer cells. This medicine is used to treat many types of cancer like lymphoma, myeloma, leukemia, breast cancer, and ovarian cancer, to name a few. This medicine may be used for other purposes; ask your health care provider or pharmacist if you have questions. What should I tell my health care provider before I take this medicine? They need to know if you have any of these conditions: -blood disorders -history of other chemotherapy -infection -kidney disease -liver disease -recent or ongoing radiation therapy -tumors in the bone marrow -an unusual or allergic reaction to cyclophosphamide, other chemotherapy, other medicines, foods, dyes, or preservatives -pregnant or trying to get pregnant -breast-feeding How should I use this medicine? This drug is usually given as an injection into a vein or muscle or by infusion into a vein. It is administered in a hospital or clinic by a specially trained health care professional. Talk to your pediatrician regarding the use of this medicine in children. Special care may be needed. Overdosage: If you think you have taken too much of this medicine contact a poison control center or  emergency room at once. NOTE: This medicine is only for you. Do not share this medicine with others. What if I miss a dose? It is important not to miss your dose. Call your doctor or health care professional if you are unable to keep an appointment. What may interact with this medicine? This medicine may interact with the following medications: -amiodarone -amphotericin B -azathioprine -certain antiviral medicines for HIV or AIDS such as protease inhibitors (e.g., indinavir, ritonavir) and zidovudine -certain blood pressure medications such as benazepril, captopril, enalapril, fosinopril, lisinopril, moexipril, monopril, perindopril, quinapril, ramipril, trandolapril -certain cancer medications such as anthracyclines (e.g., daunorubicin, doxorubicin), busulfan, cytarabine, paclitaxel, pentostatin, tamoxifen, trastuzumab -certain diuretics such as chlorothiazide, chlorthalidone, hydrochlorothiazide, indapamide, metolazone -certain medicines that treat or prevent blood clots like warfarin -certain muscle relaxants such as succinylcholine -cyclosporine -etanercept -indomethacin -medicines to increase blood counts like filgrastim, pegfilgrastim, sargramostim -medicines used as general anesthesia -metronidazole -natalizumab This list may not describe all possible interactions. Give your health care provider a list of all the medicines, herbs, non-prescription drugs, or dietary supplements you use. Also tell them if you smoke, drink alcohol, or use illegal drugs. Some items may interact with your medicine. What should I watch for while using this medicine? Visit your doctor for checks on your progress. This drug may make you feel generally unwell. This is not uncommon, as chemotherapy can affect healthy cells as well as cancer cells. Report any side effects. Continue your course of treatment even though you feel ill unless your doctor tells you to stop. Drink water or other fluids as directed.  Urinate  often, even at night. In some cases, you may be given additional medicines to help with side effects. Follow all directions for their use. Call your doctor or health care professional for advice if you get a fever, chills or sore throat, or other symptoms of a cold or flu. Do not treat yourself. This drug decreases your body's ability to fight infections. Try to avoid being around people who are sick. This medicine may increase your risk to bruise or bleed. Call your doctor or health care professional if you notice any unusual bleeding. Be careful brushing and flossing your teeth or using a toothpick because you may get an infection or bleed more easily. If you have any dental work done, tell your dentist you are receiving this medicine. You may get drowsy or dizzy. Do not drive, use machinery, or do anything that needs mental alertness until you know how this medicine affects you. Do not become pregnant while taking this medicine or for 1 year after stopping it. Women should inform their doctor if they wish to become pregnant or think they might be pregnant. Men should not father a child while taking this medicine and for 4 months after stopping it. There is a potential for serious side effects to an unborn child. Talk to your health care professional or pharmacist for more information. Do not breast-feed an infant while taking this medicine. This medicine may interfere with the ability to have a child. This medicine has caused ovarian failure in some women. This medicine has caused reduced sperm counts in some men. You should talk with your doctor or health care professional if you are concerned about your fertility. If you are going to have surgery, tell your doctor or health care professional that you have taken this medicine. What side effects may I notice from receiving this medicine? Side effects that you should report to your doctor or health care professional as soon as  possible: -allergic reactions like skin rash, itching or hives, swelling of the face, lips, or tongue -low blood counts - this medicine may decrease the number of white blood cells, red blood cells and platelets. You may be at increased risk for infections and bleeding. -signs of infection - fever or chills, cough, sore throat, pain or difficulty passing urine -signs of decreased platelets or bleeding - bruising, pinpoint red spots on the skin, black, tarry stools, blood in the urine -signs of decreased red blood cells - unusually weak or tired, fainting spells, lightheadedness -breathing problems -dark urine -dizziness -palpitations -swelling of the ankles, feet, hands -trouble passing urine or change in the amount of urine -weight gain -yellowing of the eyes or skin Side effects that usually do not require medical attention (report to your doctor or health care professional if they continue or are bothersome): -changes in nail or skin color -hair loss -missed menstrual periods -mouth sores -nausea, vomiting This list may not describe all possible side effects. Call your doctor for medical advice about side effects. You may report side effects to FDA at 1-800-FDA-1088. Where should I keep my medicine? This drug is given in a hospital or clinic and will not be stored at home. NOTE: This sheet is a summary. It may not cover all possible information. If you have questions about this medicine, talk to your doctor, pharmacist, or health care provider.    2016, [employer]/Gold Standard. (2012-10-06 16:22:58)   Pegfilgrastim injection What is this medicine? PEGFILGRASTIM (PEG fil gra stim) is a long-acting granulocyte colony-stimulating factor  that stimulates the growth of neutrophils, a type of white blood cell important in the body's fight against infection. It is used to reduce the incidence of fever and infection in patients with certain types of cancer who are receiving chemotherapy that  affects the bone marrow, and to increase survival after being exposed to high doses of radiation. This medicine may be used for other purposes; ask your health care provider or pharmacist if you have questions. What should I tell my health care provider before I take this medicine? They need to know if you have any of these conditions: -kidney disease -latex allergy -ongoing radiation therapy -sickle cell disease -skin reactions to acrylic adhesives (On-Body Injector only) -an unusual or allergic reaction to pegfilgrastim, filgrastim, other medicines, foods, dyes, or preservatives -pregnant or trying to get pregnant -breast-feeding How should I use this medicine? This medicine is for injection under the skin. If you get this medicine at home, you will be taught how to prepare and give the pre-filled syringe or how to use the On-body Injector. Refer to the patient Instructions for Use for detailed instructions. Use exactly as directed. Take your medicine at regular intervals. Do not take your medicine more often than directed. It is important that you put your used needles and syringes in a special sharps container. Do not put them in a trash can. If you do not have a sharps container, call your pharmacist or healthcare provider to get one. Talk to your pediatrician regarding the use of this medicine in children. While this drug may be prescribed for selected conditions, precautions do apply. Overdosage: If you think you have taken too much of this medicine contact a poison control center or emergency room at once. NOTE: This medicine is only for you. Do not share this medicine with others. What if I miss a dose? It is important not to miss your dose. Call your doctor or health care professional if you miss your dose. If you miss a dose due to an On-body Injector failure or leakage, a new dose should be administered as soon as possible using a single prefilled syringe for manual use. What may  interact with this medicine? Interactions have not been studied. Give your health care provider a list of all the medicines, herbs, non-prescription drugs, or dietary supplements you use. Also tell them if you smoke, drink alcohol, or use illegal drugs. Some items may interact with your medicine. This list may not describe all possible interactions. Give your health care provider a list of all the medicines, herbs, non-prescription drugs, or dietary supplements you use. Also tell them if you smoke, drink alcohol, or use illegal drugs. Some items may interact with your medicine. What should I watch for while using this medicine? You may need blood work done while you are taking this medicine. If you are going to need a MRI, CT scan, or other procedure, tell your doctor that you are using this medicine (On-Body Injector only). What side effects may I notice from receiving this medicine? Side effects that you should report to your doctor or health care professional as soon as possible: -allergic reactions like skin rash, itching or hives, swelling of the face, lips, or tongue -dizziness -fever -pain, redness, or irritation at site where injected -pinpoint red spots on the skin -red or dark-brown urine -shortness of breath or breathing problems -stomach or side pain, or pain at the shoulder -swelling -tiredness -trouble passing urine or change in the amount of urine Side  effects that usually do not require medical attention (report to your doctor or health care professional if they continue or are bothersome): -bone pain -muscle pain This list may not describe all possible side effects. Call your doctor for medical advice about side effects. You may report side effects to FDA at 1-800-FDA-1088. Where should I keep my medicine? Keep out of the reach of children. Store pre-filled syringes in a refrigerator between 2 and 8 degrees C (36 and 46 degrees F). Do not freeze. Keep in carton to protect  from light. Throw away this medicine if it is left out of the refrigerator for more than 48 hours. Throw away any unused medicine after the expiration date. NOTE: This sheet is a summary. It may not cover all possible information. If you have questions about this medicine, talk to your doctor, pharmacist, or health care provider.    2016, Elsevier/Gold Standard. (2014-12-12 14:30:14)

## 2016-03-15 NOTE — Telephone Encounter (Signed)
per pof to sch in [facility]-pt seern in infusion

## 2016-03-15 NOTE — Progress Notes (Signed)
Patient feels much better; Kiara Spencer came to re-evaluate and pt was asleep.  Patient and spouse educated to keep Benadryl and Pepcid at home and instructions for calling us and for going to the ER if needed. They verbally stated they understood.

## 2016-03-15 NOTE — Progress Notes (Signed)
At 1525, after Cytoxan had completed its after administration flush - pt while alert and oriented, reported "my nose is burning any my throat feels like it is tighter with a knot in it and my head feels real heavy".  She remained alert and in NAD the entire time and Cyndee B NP was paged and she gave telephone orders for Benadryl and Pepcid  ( see MAR). Temp taken was 97.9 degrees Farenheit. Pt reported after Benadryl was given that her nose was no longer burning and she was tired and going to rest. Husband is present. Patient has a  Self- reported history of "head Heaviness" with syncopal episodes while pregnant.

## 2016-03-16 ENCOUNTER — Telehealth: Payer: Self-pay

## 2016-03-16 ENCOUNTER — Encounter: Payer: Self-pay | Admitting: Nurse Practitioner

## 2016-03-16 DIAGNOSIS — T7840XA Allergy, unspecified, initial encounter: Secondary | ICD-10-CM | POA: Insufficient documentation

## 2016-03-16 NOTE — Telephone Encounter (Signed)
Called to follow up with patient after mild reaction to chemo. Patient states she feels well minus a little bit of nausea, states she has already taken her nausea medications. Informed patient to call if the nausea persists with medication or gets any worse. Patient verbalizes understanding and understands to call with any problems or questions before her next visit 4/24.

## 2016-03-16 NOTE — Assessment & Plan Note (Signed)
Patient presented to the [facility] today to receive her first cycle of dose dense AC chemotherapy regimen.  Patient developed a very mild reaction what she finished all of her chemotherapy for the day.  Reaction was managed per hypersensitivity protocol.  See further notes for details.  Patient is scheduled to return on 03/29/2016 for labs and chemotherapy.

## 2016-03-16 NOTE — Progress Notes (Signed)
SYMPTOM MANAGEMENT CLINIC    Chief Complaint: Hypersensitivity reaction   HPI:  Kiara Spencer 45 y.o. female diagnosed with breast cancer.  Currently undergoing dose dense AC chemotherapy regimen.    Patient presented to the [facility] today to receive her first cycle of dose dense AC chemotherapy regimen.  Patient developed a very mild reaction what she finished all of her chemotherapy for the day.  Patient developed some burning to the tip of her nose; and had some throat discomfort as well.  She felt that her head was heavy and she felt sleepy.  Patient was given Benadryl and Pepcid IV per protocol.  All symptoms resolved; and patient was able to be discharged home with her family.  Patient was advised regarding Benadryl and Pepcid for allergic reactions at home.  Also, patient and her husband were advised to go directly to the emergency department for any worsening symptoms whatsoever.   Breast cancer of upper-outer quadrant of right female breast ([place])   11/24/2015 Mammogram 2 suspicious groups of calcifications right breast UOQ posteriorly 2.2 x 2.3 x 2.5 cm (by U/S measured 1.2 x 0.8 x 1.2 cm); anteriorly 1.1 x 1.2 x 0.8 cm (not seen by ultrasound)   11/27/2015 Initial Diagnosis Right breast biopsy UOQ Posterior: IDC with DCIS with, LVI present, ER 100%, PR 20%, Ki-67 30%, HER-2 negative; ANTERIOR: Complex sclerosing lesions with calcifications   02/05/2016 Surgery Rt. Mastectomy: IDC Grade 1, 3 cm, 1/11 LN positive,with IG DCIS 0.1 cm margin, Diffuse LVI, cancer at axill tail. T2N1 (Stage 2B); Left mastectomy: Fibrocystic ch, Mammaprint high risk      ROS  Past Medical History  Diagnosis Date  . Breast cancer of upper-outer quadrant of right female breast ([place]) 12/02/2015  . Breast cancer ([place])   . Headache     hx migraines    Past Surgical History  Procedure Laterality Date  . Cesarean section  x 2  . Dilation and curettage of uterus    . Mastectomy w/ sentinel  node biopsy Right 02/05/2016    Procedure: RIGHT NIPPLE SPARING MASTECTOMY WITH RIGHT AXILLARY SENTINEL LYMPH NODE BIOPSY;  Surgeon: [name], MD;  Location: [facility];  Service: General;  Laterality: Right;  . Mastectomy, partial Left 02/05/2016    Procedure: LEFT PROPHYLACTIC NIPPLE SPARING MASTECTOMY ;  Surgeon: [name], MD;  Location: [facility];  Service: General;  Laterality: Left;  . Breast reconstruction with placement of tissue expander and flex hd (acellular hydrated dermis) Bilateral 02/05/2016    Procedure: BILATERAL BREAST RECONSTRUCTION WITH PLACEMENT OF TISSUE EXPANDER AND FLEX HD (ACELLULAR HYDRATED DERMIS);  Surgeon: Loel Lofty Dillingham, DO;  Location: [facility];  Service: Plastics;  Laterality: Bilateral;  . Portacath placement Right 03/02/2016    Procedure: INSERTION PORT-A-CATH WITH ULTRASOUND;  Surgeon: [name], MD;  Location: [facility];  Service: General;  Laterality: Right;  IJ    has Breast cancer of upper-outer quadrant of right female breast ([place]); Family history of breast cancer in mother; Genetic testing; Family history of cancer; Other iron deficiency anemias; and Hypersensitivity reaction on her problem list.    is allergic to cephalexin.    Medication List       This list is accurate as of: 03/15/16 11:59 PM.  Always use your most recent med list.               dexamethasone 4 MG tablet  Commonly known as:  DECADRON  Take 1 tablet (4 mg total)  by mouth daily. Take 1 tab day after chemo X 2 days     diazepam 2 MG tablet  Commonly known as:  VALIUM  Take 1 tablet (2 mg total) by mouth every 12 (twelve) hours.     ibuprofen 200 MG tablet  Commonly known as:  ADVIL,MOTRIN  Take 200 mg by mouth every 6 (six) hours as needed for fever or moderate pain. Reported on 03/08/2016     lidocaine-prilocaine cream  Commonly known as:  EMLA  Apply to affected area once     LORazepam 0.5 MG tablet  Commonly known as:  ATIVAN  Take 1 tablet (0.5 mg  total) by mouth at bedtime.     ondansetron 4 MG disintegrating tablet  Commonly known as:  ZOFRAN-ODT  Take 1 tablet (4 mg total) by mouth every 6 (six) hours as needed for nausea.     oxyCODONE-acetaminophen 5-325 MG tablet  Commonly known as:  PERCOCET/ROXICET  Take 1 tablet by mouth every 4 (four) hours as needed for severe pain.     polyethylene glycol packet  Commonly known as:  MIRALAX / GLYCOLAX  Take 17 g by mouth daily as needed for mild constipation.     prochlorperazine 10 MG tablet  Commonly known as:  COMPAZINE  Take 1 tablet (10 mg total) by mouth every 6 (six) hours as needed (Nausea or vomiting).     vitamin C 100 MG tablet  Take 100 mg by mouth daily.         PHYSICAL EXAMINATION  Oncology Vitals 03/15/2016 03/08/2016  Height - -  Weight - 67.359 kg  Weight (lbs) - 148 lbs 8 oz  BMI (kg/m2) - -  Temp 98.7 -  Pulse 64 66  Resp 18 -  SpO2 100 96  BSA (m2) - -   BP Readings from Last 2 Encounters:  03/15/16 109/66  03/08/16 98/66    Physical Exam  Constitutional: She is oriented to person, place, and time and well-developed, well-nourished, and in no distress.  HENT:  Head: Normocephalic and atraumatic.  Eyes: Conjunctivae and EOM are normal. Pupils are equal, round, and reactive to light. Right eye exhibits no discharge. Left eye exhibits no discharge. No scleral icterus.  Neck: Normal range of motion.  Pulmonary/Chest: Effort normal and breath sounds normal. No stridor. No respiratory distress.  Musculoskeletal: Normal range of motion.  Neurological: She is alert and oriented to person, place, and time.  Psychiatric: Affect normal.    LABORATORY DATA:. Appointment on 03/15/2016  Component Date Value Ref Range Status  . WBC 03/15/2016 3.6* 3.9 - 10.3 10e3/uL Final  . NEUT# 03/15/2016 2.0  1.5 - 6.5 10e3/uL Final  . HGB 03/15/2016 11.7  11.6 - 15.9 g/dL Final  . HCT 03/15/2016 36.9  34.8 - 46.6 % Final  . Platelets 03/15/2016 230  145 - 400  10e3/uL Final  . MCV 03/15/2016 92.6  79.5 - 101.0 fL Final  . MCH 03/15/2016 29.4  25.1 - 34.0 pg Final  . MCHC 03/15/2016 31.7  31.5 - 36.0 g/dL Final  . RBC 03/15/2016 3.99  3.70 - 5.45 10e6/uL Final  . RDW 03/15/2016 16.3* 11.2 - 14.5 % Final  . lymph# 03/15/2016 1.2  0.9 - 3.3 10e3/uL Final  . MONO# 03/15/2016 0.3  0.1 - 0.9 10e3/uL Final  . Eosinophils Absolute 03/15/2016 0.1  0.0 - 0.5 10e3/uL Final  . Basophils Absolute 03/15/2016 0.0  0.0 - 0.1 10e3/uL Final  . NEUT% 03/15/2016 54.0  38.4 -  76.8 % Final  . LYMPH% 03/15/2016 33.1  14.0 - 49.7 % Final  . MONO% 03/15/2016 7.9  0.0 - 14.0 % Final  . EOS% 03/15/2016 4.0  0.0 - 7.0 % Final  . BASO% 03/15/2016 1.0  0.0 - 2.0 % Final  . Sodium 03/15/2016 141  136 - 145 mEq/L Final  . Potassium 03/15/2016 4.4  3.5 - 5.1 mEq/L Final  . Chloride 03/15/2016 108  98 - 109 mEq/L Final  . CO2 03/15/2016 28  22 - 29 mEq/L Final  . Glucose 03/15/2016 71  70 - 140 mg/dl Final   Glucose reference range is for nonfasting patients. Fasting glucose reference range is 70- 100.  [name] BUN 03/15/2016 12.3  7.0 - 26.0 mg/dL Final  . Creatinine 03/15/2016 0.8  0.6 - 1.1 mg/dL Final  . Total Bilirubin 03/15/2016 0.34  0.20 - 1.20 mg/dL Final  . Alkaline Phosphatase 03/15/2016 57  40 - 150 U/L Final  . AST 03/15/2016 18  5 - 34 U/L Final  . ALT 03/15/2016 20  0 - 55 U/L Final  . Total Protein 03/15/2016 7.3  6.4 - 8.3 g/dL Final  . Albumin 03/15/2016 3.8  3.5 - 5.0 g/dL Final  . Calcium 03/15/2016 9.6  8.4 - 10.4 mg/dL Final  . Anion Gap 03/15/2016 6  3 - 11 mEq/L Final  . EGFR 03/15/2016 >90  >90 ml/min/1.73 m2 Final   eGFR is calculated using the CKD-EPI Creatinine Equation (2009)    RADIOGRAPHIC STUDIES: No results found.  ASSESSMENT/PLAN:    Breast cancer of upper-outer quadrant of right female breast [facility]) Patient presented to the [facility] today to receive her first cycle of dose dense AC chemotherapy regimen.  Patient developed a very  mild reaction what she finished all of her chemotherapy for the day.  Reaction was managed per hypersensitivity protocol.  See further notes for details.  Patient is scheduled to return on 03/29/2016 for labs and chemotherapy.  Hypersensitivity reaction Patient presented to the [facility] today to receive her first cycle of dose dense AC chemotherapy regimen.  Patient developed a very mild reaction what she finished all of her chemotherapy for the day.  Patient developed some burning to the tip of her nose; and had some throat discomfort as well.  She felt that her head was heavy and she felt sleepy.  Patient was given Benadryl and Pepcid IV per protocol.  All symptoms resolved; and patient was able to be discharged home with her family.  Patient was advised regarding Benadryl and Pepcid for allergic reactions at home.  Also, patient and her husband were advised to go directly to the emergency department for any worsening symptoms whatsoever.   Patient stated understanding of all instructions; and was in agreement with this plan of care. The patient knows to call the clinic with any problems, questions or concerns.   Total time spent with patient was 15 minutes;  with greater than 25 percent of that time spent in face to face counseling regarding patient's symptoms,  and coordination of care and follow up.  Disclaimer:This dictation was prepared with Dragon/digital dictation along with [employer]. Any transcriptional errors that result from this process are unintentional.  Drue Second, NP 03/16/2016

## 2016-03-16 NOTE — Assessment & Plan Note (Signed)
Patient presented to the [facility] today to receive her first cycle of dose dense AC chemotherapy regimen.  Patient developed a very mild reaction what she finished all of her chemotherapy for the day.  Patient developed some burning to the tip of her nose; and had some throat discomfort as well.  She felt that her head was heavy and she felt sleepy.  Patient was given Benadryl and Pepcid IV per protocol.  All symptoms resolved; and patient was able to be discharged home with her family.  Patient was advised regarding Benadryl and Pepcid for allergic reactions at home.  Also, patient and her husband were advised to go directly to the emergency department for any worsening symptoms whatsoever.

## 2016-03-17 ENCOUNTER — Telehealth: Payer: Self-pay | Admitting: Nurse Practitioner

## 2016-03-17 NOTE — Telephone Encounter (Signed)
lvm to inform pt of appt time change on 4/24. Gave pt new times per 4/10 pof

## 2016-03-22 ENCOUNTER — Encounter: Payer: Self-pay | Admitting: *Deleted

## 2016-03-23 NOTE — Anesthesia Postprocedure Evaluation (Signed)
Anesthesia Post Note  Patient: Kiara Spencer  Procedure(s) Performed: Procedure(s) (LRB): INSERTION PORT-A-CATH WITH ULTRASOUND (Right)  Anesthesia Type: General Level of consciousness: awake and alert Pain management: pain level controlled Vital Signs Assessment: post-procedure vital signs reviewed and stable Respiratory status: spontaneous breathing, nonlabored ventilation, respiratory function stable and patient connected to nasal cannula oxygen Cardiovascular status: blood pressure returned to baseline and stable Postop Assessment: no signs of nausea or vomiting Anesthetic complications: no    Last Vitals:  Filed Vitals:   03/02/16 1330 03/02/16 1430  BP: 107/62 131/77  Pulse: 64 67  Temp:  36.8 C  Resp: 12 20    Last Pain:  Filed Vitals:   03/03/16 0949  PainSc: 0-No pain                 Benjermin Korber JENNETTE

## 2016-03-29 ENCOUNTER — Ambulatory Visit (HOSPITAL_BASED_OUTPATIENT_CLINIC_OR_DEPARTMENT_OTHER): Payer: BLUE CROSS/BLUE SHIELD

## 2016-03-29 ENCOUNTER — Other Ambulatory Visit (HOSPITAL_BASED_OUTPATIENT_CLINIC_OR_DEPARTMENT_OTHER): Payer: BLUE CROSS/BLUE SHIELD

## 2016-03-29 ENCOUNTER — Ambulatory Visit (HOSPITAL_BASED_OUTPATIENT_CLINIC_OR_DEPARTMENT_OTHER): Payer: BLUE CROSS/BLUE SHIELD | Admitting: Nurse Practitioner

## 2016-03-29 ENCOUNTER — Ambulatory Visit: Payer: BLUE CROSS/BLUE SHIELD

## 2016-03-29 ENCOUNTER — Encounter: Payer: Self-pay | Admitting: Nurse Practitioner

## 2016-03-29 ENCOUNTER — Telehealth: Payer: Self-pay | Admitting: Hematology and Oncology

## 2016-03-29 VITALS — BP 106/76 | HR 93 | Temp 98.1°F | Resp 18 | Wt 151.2 lb

## 2016-03-29 DIAGNOSIS — C50411 Malignant neoplasm of upper-outer quadrant of right female breast: Secondary | ICD-10-CM | POA: Diagnosis not present

## 2016-03-29 DIAGNOSIS — M898X9 Other specified disorders of bone, unspecified site: Secondary | ICD-10-CM

## 2016-03-29 DIAGNOSIS — Z5111 Encounter for antineoplastic chemotherapy: Secondary | ICD-10-CM | POA: Diagnosis not present

## 2016-03-29 LAB — CBC WITH DIFFERENTIAL/PLATELET
BASO%: 0.7 % (ref 0.0–2.0)
BASOS ABS: 0.1 10*3/uL (ref 0.0–0.1)
EOS%: 0.1 % (ref 0.0–7.0)
Eosinophils Absolute: 0 10*3/uL (ref 0.0–0.5)
HEMATOCRIT: 39.4 % (ref 34.8–46.6)
HEMOGLOBIN: 12.7 g/dL (ref 11.6–15.9)
LYMPH#: 1.6 10*3/uL (ref 0.9–3.3)
LYMPH%: 17.7 % (ref 14.0–49.7)
MCH: 29.5 pg (ref 25.1–34.0)
MCHC: 32.3 g/dL (ref 31.5–36.0)
MCV: 91.3 fL (ref 79.5–101.0)
MONO#: 0.7 10*3/uL (ref 0.1–0.9)
MONO%: 7.9 % (ref 0.0–14.0)
NEUT#: 6.5 10*3/uL (ref 1.5–6.5)
NEUT%: 73.6 % (ref 38.4–76.8)
Platelets: 255 10*3/uL (ref 145–400)
RBC: 4.31 10*6/uL (ref 3.70–5.45)
RDW: 14.9 % — AB (ref 11.2–14.5)
WBC: 8.9 10*3/uL (ref 3.9–10.3)

## 2016-03-29 LAB — COMPREHENSIVE METABOLIC PANEL
ALBUMIN: 3.9 g/dL (ref 3.5–5.0)
ALT: 24 U/L (ref 0–55)
AST: 20 U/L (ref 5–34)
Alkaline Phosphatase: 78 U/L (ref 40–150)
Anion Gap: 9 mEq/L (ref 3–11)
BUN: 16.7 mg/dL (ref 7.0–26.0)
CALCIUM: 9.7 mg/dL (ref 8.4–10.4)
CO2: 26 mEq/L (ref 22–29)
CREATININE: 0.8 mg/dL (ref 0.6–1.1)
Chloride: 105 mEq/L (ref 98–109)
EGFR: >90 mL/min/{1.73_m2} (ref >90)
Glucose: 104 mg/dl (ref 70–140)
Potassium: 4.2 mEq/L (ref 3.5–5.1)
Sodium: 141 mEq/L (ref 136–145)
TOTAL PROTEIN: 7.3 g/dL (ref 6.4–8.3)
Total Bilirubin: <0.3 mg/dL (ref 0.20–1.20)

## 2016-03-29 MED ORDER — DOXORUBICIN HCL CHEMO IV INJECTION 2 MG/ML
60.0000 mg/m2 | Freq: Once | INTRAVENOUS | Status: AC
  Administered 2016-03-29: 104 mg via INTRAVENOUS
  Filled 2016-03-29: qty 52

## 2016-03-29 MED ORDER — PALONOSETRON HCL INJECTION 0.25 MG/5ML
INTRAVENOUS | Status: AC
  Filled 2016-03-29: qty 5

## 2016-03-29 MED ORDER — SODIUM CHLORIDE 0.9 % IV SOLN
Freq: Once | INTRAVENOUS | Status: AC
  Administered 2016-03-29: 11:00:00 via INTRAVENOUS

## 2016-03-29 MED ORDER — PALONOSETRON HCL INJECTION 0.25 MG/5ML
0.2500 mg | Freq: Once | INTRAVENOUS | Status: AC
  Administered 2016-03-29: 0.25 mg via INTRAVENOUS

## 2016-03-29 MED ORDER — SODIUM CHLORIDE 0.9% FLUSH
10.0000 mL | INTRAVENOUS | Status: DC | PRN
  Administered 2016-03-29: 10 mL
  Filled 2016-03-29: qty 10

## 2016-03-29 MED ORDER — PEGFILGRASTIM 6 MG/0.6ML ~~LOC~~ PSKT
6.0000 mg | PREFILLED_SYRINGE | Freq: Once | SUBCUTANEOUS | Status: AC
  Administered 2016-03-29: 6 mg via SUBCUTANEOUS
  Filled 2016-03-29: qty 0.6

## 2016-03-29 MED ORDER — HEPARIN SOD (PORK) LOCK FLUSH 100 UNIT/ML IV SOLN
500.0000 [IU] | Freq: Once | INTRAVENOUS | Status: AC | PRN
  Administered 2016-03-29: 500 [IU]
  Filled 2016-03-29: qty 5

## 2016-03-29 MED ORDER — FOSAPREPITANT DIMEGLUMINE INJECTION 150 MG
Freq: Once | INTRAVENOUS | Status: AC
  Administered 2016-03-29: 11:00:00 via INTRAVENOUS
  Filled 2016-03-29: qty 5

## 2016-03-29 MED ORDER — SODIUM CHLORIDE 0.9 % IV SOLN
600.0000 mg/m2 | Freq: Once | INTRAVENOUS | Status: AC
  Administered 2016-03-29: 1040 mg via INTRAVENOUS
  Filled 2016-03-29: qty 52

## 2016-03-29 NOTE — Progress Notes (Signed)
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 [facility])   Staging form: Breast, AJCC 7th Edition     Clinical stage from 12/10/2015: Stage IIA (T2, N0, M0) - Unsigned       Staging comments: Staged at breast conference on 1.4.17  SUMMARY OF ONCOLOGIC HISTORY:   Breast cancer of upper-outer quadrant of right female breast ([place])   11/24/2015 Mammogram 2 suspicious groups of calcifications right breast UOQ posteriorly 2.2 x 2.3 x 2.5 cm (by U/S measured 1.2 x 0.8 x 1.2 cm); anteriorly 1.1 x 1.2 x 0.8 cm (not seen by ultrasound)   11/27/2015 Initial Diagnosis Right breast biopsy UOQ Posterior: IDC with DCIS with, LVI present, ER 100%, PR 20%, Ki-67 30%, HER-2 negative; ANTERIOR: Complex sclerosing lesions with calcifications   02/05/2016 Surgery Rt. Mastectomy: IDC Grade 1, 3 cm, 1/11 LN positive,with IG DCIS 0.1 cm margin, Diffuse LVI, cancer at axill tail. T2N1 (Stage 2B); Left mastectomy: Fibrocystic ch, Mammaprint high risk      CHIEF COMPLIANT: cycle 2 AC  INTERVAL HISTORY: Kiara Spencer is a 45 year old with above-mentioned history of right breast cancer who had ER/PR positive HER-2 negative disease that was involving one out of 11 lymph nodes. She is due for cycle 2 of adriamycin and cytoxan. She had a mild hypersensitivity reaction to the first cycle, but it quickly resolved with pepcid and benadryl. Otherwise, she had few issues. She denies fevers, chills, nausea or vomiting. Her appetite is good. She continues on percocet PRN for pain associated with her expanders.  REVIEW OF SYSTEMS:   Constitutional: Denies fevers,  chills or abnormal weight loss Eyes: Denies blurriness of vision Ears, nose, mouth, throat, and face: Denies mucositis or sore throat Respiratory: Denies cough, dyspnea or wheezes Cardiovascular: Denies palpitation, chest discomfort Gastrointestinal:  Denies nausea, heartburn or change in bowel habits Skin: Denies abnormal skin rashes Lymphatics: Denies new lymphadenopathy or easy bruising Neurological:Denies numbness, tingling or new weaknesses Behavioral/Psych: Mood is stable, no new changes  Extremities: No lower extremity edema Breast: Bilateral mastectomies All other systems were reviewed with the patient and are negative.  I have reviewed the past medical history, past surgical history, social history and family history with the patient and they are unchanged from previous note.  ALLERGIES:  is allergic to cephalexin.  MEDICATIONS:  Current Outpatient Prescriptions  Medication Sig Dispense Refill  . Ascorbic Acid (VITAMIN C) 100 MG tablet Take 100 mg by mouth daily.    [name] dexamethasone (DECADRON) 4 MG tablet Take 1 tablet (4 mg total) by mouth daily. Take 1 tab day after chemo X 2 days 15 tablet 1  . diazepam (VALIUM) 2 MG tablet Take 1 tablet (2 mg total) by mouth every 12 (twelve) hours. 30 tablet 0  . lidocaine-prilocaine (EMLA) cream Apply to affected area once 30 g 3  . ondansetron (ZOFRAN-ODT) 4 MG disintegrating tablet Take 1 tablet (4 mg total) by mouth every 6 (six) hours as needed for nausea. 20 tablet 0  . oxyCODONE-acetaminophen (PERCOCET/ROXICET) 5-325 MG tablet Take 1 tablet by mouth every 4 (four) hours as needed for severe pain.    [name] ibuprofen (ADVIL,MOTRIN) 200 MG tablet Take 200 mg by mouth every 6 (  six) hours as needed for fever or moderate pain. Reported on 03/29/2016    . LORazepam (ATIVAN) 0.5 MG tablet Take 1 tablet (0.5 mg total) by mouth at bedtime. (Patient not taking: Reported on 03/29/2016) 30 tablet 0  . polyethylene glycol (MIRALAX / GLYCOLAX) packet Take  17 g by mouth daily as needed for mild constipation. (Patient not taking: Reported on 03/29/2016) 14 each 0  . prochlorperazine (COMPAZINE) 10 MG tablet Take 1 tablet (10 mg total) by mouth every 6 (six) hours as needed (Nausea or vomiting). (Patient not taking: Reported on 03/08/2016) 30 tablet 1   No current facility-administered medications for this visit.    PHYSICAL EXAMINATION: ECOG PERFORMANCE STATUS: 1 - Symptomatic but completely ambulatory  Filed Vitals:   03/29/16 0919  BP: 106/76  Pulse: 93  Temp: 98.1 F (36.7 C)  Resp: 18   Filed Weights   03/29/16 0919  Weight: 151 lb 3.2 oz (68.584 kg)    GENERAL:alert, no distress and comfortable SKIN: skin color, texture, turgor are normal, no rashes or significant lesions EYES: normal, Conjunctiva are pink and non-injected, sclera clear OROPHARYNX:no exudate, no erythema and lips, buccal mucosa, and tongue normal  NECK: supple, thyroid normal size, non-tender, without nodularity LYMPH:  no palpable lymphadenopathy in the cervical, axillary or inguinal LUNGS: clear to auscultation and percussion with normal breathing effort HEART: regular rate & rhythm and no murmurs and no lower extremity edema ABDOMEN:abdomen soft, non-tender and normal bowel sounds MUSCULOSKELETAL:no cyanosis of digits and no clubbing  NEURO: alert & oriented x 3 with fluent speech, no focal motor/sensory deficits EXTREMITIES: No lower extremity edema  LABORATORY DATA:  I have reviewed the data as listed   Chemistry      Component Value Date/Time   NA 141 03/29/2016 0901   NA 140 03/02/2016 1045   K 4.2 03/29/2016 0901   K 3.9 03/02/2016 1045   CL 102 03/02/2016 1045   CO2 26 03/29/2016 0901   CO2 26 02/07/2016 0551   BUN 16.7 03/29/2016 0901   BUN 15 03/02/2016 1045   CREATININE 0.8 03/29/2016 0901   CREATININE 0.70 03/02/2016 1045      Component Value Date/Time   CALCIUM 9.7 03/29/2016 0901   CALCIUM 8.7* 02/07/2016 0551   ALKPHOS 78  03/29/2016 0901   ALKPHOS 57 01/28/2016 0850   AST 20 03/29/2016 0901   AST 21 01/28/2016 0850   ALT 24 03/29/2016 0901   ALT 14 01/28/2016 0850   BILITOT <0.30 03/29/2016 0901   BILITOT 0.2* 01/28/2016 0850       Lab Results  Component Value Date   WBC 8.9 03/29/2016   HGB 12.7 03/29/2016   HCT 39.4 03/29/2016   MCV 91.3 03/29/2016   PLT 255 03/29/2016   NEUTROABS 6.5 03/29/2016     ASSESSMENT & PLAN:  Breast cancer of upper-outer quadrant of right female breast ([place]) Rt. 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 T2N1 (Stage 2B); Left mastectomy: Fibrocystic ch. Mammaprint high risk  Tumor board recommendation: 1. No indication for repeat resection since there is nothing further to move in the posterior side 2. Mammaprint: High risk, recommended systemic chemotherapy 3. Followed by radiation therapy (she may choose to undergo radiation in [place].) 4. Followed by antiestrogen therapy.   Recommended to participate in PREVENT clinical trial. ----------- Current Treatment:  Cycle 2, day 1 AC  Chemotherapy Side Effects: 1. Bone pain from  neulasta use: claritin daily for 3-4 days 2. Mild hypersensitivity reaction during cycle 1: resolved with benadryl and pepcid. No adjustments to future chemo made.  Labs were reviewed in detail and were stable. Will proceed with cycle 2 as planned today. Will return to clinic in 2 weeks for cycle 3 of treatment.   No orders of the defined types were placed in this encounter.   The patient has a good understanding of the overall plan. she agrees with it. she will call with any problems that may develop before the next visit here.   Laurie Panda, NP 03/29/2016

## 2016-03-29 NOTE — Telephone Encounter (Signed)
appt made and avs will print in treatment room °

## 2016-03-29 NOTE — Patient Instructions (Signed)
[facility] Discharge Instructions for Patients Receiving Chemotherapy  Today you received the following chemotherapy agents Adriamycin, Cytoxan and Neulasta. To help prevent nausea and vomiting after your treatment, we encourage you to take your nausea medication as directed for as needed BUT NO ZOFRAN FOR 3 DAYS, TAKE COMPAZINE DURING THAT TIME.  If you develop nausea and vomiting that is not controlled by your nausea medication, call the clinic.   BELOW ARE SYMPTOMS THAT SHOULD BE REPORTED IMMEDIATELY:  *FEVER GREATER THAN 100.5 F  *CHILLS WITH OR WITHOUT FEVER  NAUSEA AND VOMITING THAT IS NOT CONTROLLED WITH YOUR NAUSEA MEDICATION  *UNUSUAL SHORTNESS OF BREATH  *UNUSUAL BRUISING OR BLEEDING  TENDERNESS IN MOUTH AND THROAT WITH OR WITHOUT PRESENCE OF ULCERS  *URINARY PROBLEMS  *BOWEL PROBLEMS  UNUSUAL RASH Items with * indicate a potential emergency and should be followed up as soon as possible.  Feel free to call the clinic you have any questions or concerns. The clinic phone number is [phone].  Please show the [facility] at check-in to the Emergency Department and triage nurse.   Doxorubicin injection What is this medicine? DOXORUBICIN (dox oh ROO bi sin) is a chemotherapy drug. It is used to treat many kinds of cancer like Hodgkin's disease, leukemia, non-Hodgkin's lymphoma, neuroblastoma, sarcoma, and Wilms' tumor. It is also used to treat bladder cancer, breast cancer, lung cancer, ovarian cancer, stomach cancer, and thyroid cancer. This medicine may be used for other purposes; ask your health care provider or pharmacist if you have questions. What should I tell my health care provider before I take this medicine? They need to know if you have any of these conditions: -blood disorders -heart disease, recent heart attack -infection (especially a virus infection such as chickenpox, cold sores, or herpes) -irregular heartbeat -liver  disease -recent or ongoing radiation therapy -an unusual or allergic reaction to doxorubicin, other chemotherapy agents, other medicines, foods, dyes, or preservatives -pregnant or trying to get pregnant -breast-feeding How should I use this medicine? This drug is given as an infusion into a vein. It is administered in a hospital or clinic by a specially trained health care professional. If you have pain, swelling, burning or any unusual feeling around the site of your injection, tell your health care professional right away. Talk to your pediatrician regarding the use of this medicine in children. Special care may be needed. Overdosage: If you think you have taken too much of this medicine contact a poison control center or emergency room at once. NOTE: This medicine is only for you. Do not share this medicine with others. What if I miss a dose? It is important not to miss your dose. Call your doctor or health care professional if you are unable to keep an appointment. What may interact with this medicine? Do not take this medicine with any of the following medications: -cisapride -droperidol -halofantrine -pimozide -zidovudine This medicine may also interact with the following medications: -chloroquine -chlorpromazine -clarithromycin -cyclophosphamide -cyclosporine -erythromycin -medicines for depression, anxiety, or psychotic disturbances -medicines for irregular heart beat like amiodarone, bepridil, dofetilide, encainide, flecainide, propafenone, quinidine -medicines for seizures like ethotoin, fosphenytoin, phenytoin -medicines for nausea, vomiting like dolasetron, ondansetron, palonosetron -medicines to increase blood counts like filgrastim, pegfilgrastim, sargramostim -methadone -methotrexate -pentamidine -progesterone -vaccines -verapamil Talk to your doctor or health care professional before taking any of these  medicines: -acetaminophen -aspirin -ibuprofen -ketoprofen -naproxen This list may not describe all possible interactions. Give your health care provider a list  of all the medicines, herbs, non-prescription drugs, or dietary supplements you use. Also tell them if you smoke, drink alcohol, or use illegal drugs. Some items may interact with your medicine. What should I watch for while using this medicine? Your condition will be monitored carefully while you are receiving this medicine. You will need important blood work done while you are taking this medicine. This drug may make you feel generally unwell. This is not uncommon, as chemotherapy can affect healthy cells as well as cancer cells. Report any side effects. Continue your course of treatment even though you feel ill unless your doctor tells you to stop. Your urine may turn red for a few days after your dose. This is not blood. If your urine is dark or brown, call your doctor. In some cases, you may be given additional medicines to help with side effects. Follow all directions for their use. Call your doctor or health care professional for advice if you get a fever, chills or sore throat, or other symptoms of a cold or flu. Do not treat yourself. This drug decreases your body's ability to fight infections. Try to avoid being around people who are sick. This medicine may increase your risk to bruise or bleed. Call your doctor or health care professional if you notice any unusual bleeding. Be careful brushing and flossing your teeth or using a toothpick because you may get an infection or bleed more easily. If you have any dental work done, tell your dentist you are receiving this medicine. Avoid taking products that contain aspirin, acetaminophen, ibuprofen, naproxen, or ketoprofen unless instructed by your doctor. These medicines may hide a fever. Men and women of childbearing age should use effective birth control methods while using taking this  medicine. Do not become pregnant while taking this medicine. There is a potential for serious side effects to an unborn child. Talk to your health care professional or pharmacist for more information. Do not breast-feed an infant while taking this medicine. Do not let others touch your urine or other body fluids for 5 days after each treatment with this medicine. Caregivers should wear latex gloves to avoid touching body fluids during this time. There is a maximum amount of this medicine you should receive throughout your life. The amount depends on the medical condition being treated and your overall health. Your doctor will watch how much of this medicine you receive in your lifetime. Tell your doctor if you have taken this medicine before. What side effects may I notice from receiving this medicine? Side effects that you should report to your doctor or health care professional as soon as possible: -allergic reactions like skin rash, itching or hives, swelling of the face, lips, or tongue -low blood counts - this medicine may decrease the number of white blood cells, red blood cells and platelets. You may be at increased risk for infections and bleeding. -signs of infection - fever or chills, cough, sore throat, pain or difficulty passing urine -signs of decreased platelets or bleeding - bruising, pinpoint red spots on the skin, black, tarry stools, blood in the urine -signs of decreased red blood cells - unusually weak or tired, fainting spells, lightheadedness -breathing problems -chest pain -fast, irregular heartbeat -mouth sores -nausea, vomiting -pain, swelling, redness at site where injected -pain, tingling, numbness in the hands or feet -swelling of ankles, feet, or hands -unusual bleeding or bruising Side effects that usually do not require medical attention (report to your doctor or health care professional  if they continue or are bothersome): -diarrhea -facial flushing -hair  loss -loss of appetite -missed menstrual periods -nail discoloration or damage -red or watery eyes -red colored urine -stomach upset This list may not describe all possible side effects. Call your doctor for medical advice about side effects. You may report side effects to FDA at 1-800-FDA-1088. Where should I keep my medicine? This drug is given in a hospital or clinic and will not be stored at home. NOTE: This sheet is a summary. It may not cover all possible information. If you have questions about this medicine, talk to your doctor, pharmacist, or health care provider.    2016, [employer]vier/[employer] Standard. (2013-03-20 09:54:34)   Cyclophosphamide injection What is this medicine? CYCLOPHOSPHAMIDE (sye kloe FOSS fa mide) is a chemotherapy drug. It slows the growth of cancer cells. This medicine is used to treat many types of cancer like lymphoma, myeloma, leukemia, breast cancer, and ovarian cancer, to name a few. This medicine may be used for other purposes; ask your health care provider or pharmacist if you have questions. What should I tell my health care provider before I take this medicine? They need to know if you have any of these conditions: -blood disorders -history of other chemotherapy -infection -kidney disease -liver disease -recent or ongoing radiation therapy -tumors in the bone marrow -an unusual or allergic reaction to cyclophosphamide, other chemotherapy, other medicines, foods, dyes, or preservatives -pregnant or trying to get pregnant -breast-feeding How should I use this medicine? This drug is usually given as an injection into a vein or muscle or by infusion into a vein. It is administered in a hospital or clinic by a specially trained health care professional. Talk to your pediatrician regarding the use of this medicine in children. Special care may be needed. Overdosage: If you think you have taken too much of this medicine contact a poison control center or  emergency room at once. NOTE: This medicine is only for you. Do not share this medicine with others. What if I miss a dose? It is important not to miss your dose. Call your doctor or health care professional if you are unable to keep an appointment. What may interact with this medicine? This medicine may interact with the following medications: -amiodarone -amphotericin B -azathioprine -certain antiviral medicines for HIV or AIDS such as protease inhibitors (e.g., indinavir, ritonavir) and zidovudine -certain blood pressure medications such as benazepril, captopril, enalapril, fosinopril, lisinopril, moexipril, monopril, perindopril, quinapril, ramipril, trandolapril -certain cancer medications such as anthracyclines (e.g., daunorubicin, doxorubicin), busulfan, cytarabine, paclitaxel, pentostatin, tamoxifen, trastuzumab -certain diuretics such as chlorothiazide, chlorthalidone, hydrochlorothiazide, indapamide, metolazone -certain medicines that treat or prevent blood clots like warfarin -certain muscle relaxants such as succinylcholine -cyclosporine -etanercept -indomethacin -medicines to increase blood counts like filgrastim, pegfilgrastim, sargramostim -medicines used as general anesthesia -metronidazole -natalizumab This list may not describe all possible interactions. Give your health care provider a list of all the medicines, herbs, non-prescription drugs, or dietary supplements you use. Also tell them if you smoke, drink alcohol, or use illegal drugs. Some items may interact with your medicine. What should I watch for while using this medicine? Visit your doctor for checks on your progress. This drug may make you feel generally unwell. This is not uncommon, as chemotherapy can affect healthy cells as well as cancer cells. Report any side effects. Continue your course of treatment even though you feel ill unless your doctor tells you to stop. Drink water or other fluids as directed.  Urinate  often, even at night. In some cases, you may be given additional medicines to help with side effects. Follow all directions for their use. Call your doctor or health care professional for advice if you get a fever, chills or sore throat, or other symptoms of a cold or flu. Do not treat yourself. This drug decreases your body's ability to fight infections. Try to avoid being around people who are sick. This medicine may increase your risk to bruise or bleed. Call your doctor or health care professional if you notice any unusual bleeding. Be careful brushing and flossing your teeth or using a toothpick because you may get an infection or bleed more easily. If you have any dental work done, tell your dentist you are receiving this medicine. You may get drowsy or dizzy. Do not drive, use machinery, or do anything that needs mental alertness until you know how this medicine affects you. Do not become pregnant while taking this medicine or for 1 year after stopping it. Women should inform their doctor if they wish to become pregnant or think they might be pregnant. Men should not father a child while taking this medicine and for 4 months after stopping it. There is a potential for serious side effects to an unborn child. Talk to your health care professional or pharmacist for more information. Do not breast-feed an infant while taking this medicine. This medicine may interfere with the ability to have a child. This medicine has caused ovarian failure in some women. This medicine has caused reduced sperm counts in some men. You should talk with your doctor or health care professional if you are concerned about your fertility. If you are going to have surgery, tell your doctor or health care professional that you have taken this medicine. What side effects may I notice from receiving this medicine? Side effects that you should report to your doctor or health care professional as soon as  possible: -allergic reactions like skin rash, itching or hives, swelling of the face, lips, or tongue -low blood counts - this medicine may decrease the number of white blood cells, red blood cells and platelets. You may be at increased risk for infections and bleeding. -signs of infection - fever or chills, cough, sore throat, pain or difficulty passing urine -signs of decreased platelets or bleeding - bruising, pinpoint red spots on the skin, black, tarry stools, blood in the urine -signs of decreased red blood cells - unusually weak or tired, fainting spells, lightheadedness -breathing problems -dark urine -dizziness -palpitations -swelling of the ankles, feet, hands -trouble passing urine or change in the amount of urine -weight gain -yellowing of the eyes or skin Side effects that usually do not require medical attention (report to your doctor or health care professional if they continue or are bothersome): -changes in nail or skin color -hair loss -missed menstrual periods -mouth sores -nausea, vomiting This list may not describe all possible side effects. Call your doctor for medical advice about side effects. You may report side effects to FDA at 1-800-FDA-1088. Where should I keep my medicine? This drug is given in a hospital or clinic and will not be stored at home. NOTE: This sheet is a summary. It may not cover all possible information. If you have questions about this medicine, talk to your doctor, pharmacist, or health care provider.    2016, [employer] Standard. (2012-10-06 16:22:58)   Pegfilgrastim injection What is this medicine? PEGFILGRASTIM (PEG fil gra stim) is a long-acting granulocyte colony-stimulating factor  that stimulates the growth of neutrophils, a type of white blood cell important in the body's fight against infection. It is used to reduce the incidence of fever and infection in patients with certain types of cancer who are receiving chemotherapy that  affects the bone marrow, and to increase survival after being exposed to high doses of radiation. This medicine may be used for other purposes; ask your health care provider or pharmacist if you have questions. What should I tell my health care provider before I take this medicine? They need to know if you have any of these conditions: -kidney disease -latex allergy -ongoing radiation therapy -sickle cell disease -skin reactions to acrylic adhesives (On-Body Injector only) -an unusual or allergic reaction to pegfilgrastim, filgrastim, other medicines, foods, dyes, or preservatives -pregnant or trying to get pregnant -breast-feeding How should I use this medicine? This medicine is for injection under the skin. If you get this medicine at home, you will be taught how to prepare and give the pre-filled syringe or how to use the On-body Injector. Refer to the patient Instructions for Use for detailed instructions. Use exactly as directed. Take your medicine at regular intervals. Do not take your medicine more often than directed. It is important that you put your used needles and syringes in a special sharps container. Do not put them in a trash can. If you do not have a sharps container, call your pharmacist or healthcare provider to get one. Talk to your pediatrician regarding the use of this medicine in children. While this drug may be prescribed for selected conditions, precautions do apply. Overdosage: If you think you have taken too much of this medicine contact a poison control center or emergency room at once. NOTE: This medicine is only for you. Do not share this medicine with others. What if I miss a dose? It is important not to miss your dose. Call your doctor or health care professional if you miss your dose. If you miss a dose due to an On-body Injector failure or leakage, a new dose should be administered as soon as possible using a single prefilled syringe for manual use. What may  interact with this medicine? Interactions have not been studied. Give your health care provider a list of all the medicines, herbs, non-prescription drugs, or dietary supplements you use. Also tell them if you smoke, drink alcohol, or use illegal drugs. Some items may interact with your medicine. This list may not describe all possible interactions. Give your health care provider a list of all the medicines, herbs, non-prescription drugs, or dietary supplements you use. Also tell them if you smoke, drink alcohol, or use illegal drugs. Some items may interact with your medicine. What should I watch for while using this medicine? You may need blood work done while you are taking this medicine. If you are going to need a MRI, CT scan, or other procedure, tell your doctor that you are using this medicine (On-Body Injector only). What side effects may I notice from receiving this medicine? Side effects that you should report to your doctor or health care professional as soon as possible: -allergic reactions like skin rash, itching or hives, swelling of the face, lips, or tongue -dizziness -fever -pain, redness, or irritation at site where injected -pinpoint red spots on the skin -red or dark-brown urine -shortness of breath or breathing problems -stomach or side pain, or pain at the shoulder -swelling -tiredness -trouble passing urine or change in the amount of urine Side  effects that usually do not require medical attention (report to your doctor or health care professional if they continue or are bothersome): -bone pain -muscle pain This list may not describe all possible side effects. Call your doctor for medical advice about side effects. You may report side effects to FDA at 1-800-FDA-1088. Where should I keep my medicine? Keep out of the reach of children. Store pre-filled syringes in a refrigerator between 2 and 8 degrees C (36 and 46 degrees F). Do not freeze. Keep in carton to protect  from light. Throw away this medicine if it is left out of the refrigerator for more than 48 hours. Throw away any unused medicine after the expiration date. NOTE: This sheet is a summary. It may not cover all possible information. If you have questions about this medicine, talk to your doctor, pharmacist, or health care provider.    2016, [employer] Standard. (2014-12-12 14:30:14)

## 2016-04-07 ENCOUNTER — Telehealth: Payer: Self-pay | Admitting: *Deleted

## 2016-04-07 NOTE — Telephone Encounter (Signed)
Received call from patient stating she went to her OB/GYN and has a UTI. She wants to know if she can take the antibiotic they want to give her for the UTI.  Informed her that is was ok to take the antibiotic. She verbalized understanding.

## 2016-04-12 ENCOUNTER — Encounter: Payer: Self-pay | Admitting: Hematology and Oncology

## 2016-04-12 ENCOUNTER — Other Ambulatory Visit (HOSPITAL_BASED_OUTPATIENT_CLINIC_OR_DEPARTMENT_OTHER): Payer: BLUE CROSS/BLUE SHIELD

## 2016-04-12 ENCOUNTER — Ambulatory Visit (HOSPITAL_BASED_OUTPATIENT_CLINIC_OR_DEPARTMENT_OTHER): Payer: BLUE CROSS/BLUE SHIELD

## 2016-04-12 ENCOUNTER — Ambulatory Visit (HOSPITAL_BASED_OUTPATIENT_CLINIC_OR_DEPARTMENT_OTHER): Payer: BLUE CROSS/BLUE SHIELD | Admitting: Hematology and Oncology

## 2016-04-12 ENCOUNTER — Ambulatory Visit: Payer: BLUE CROSS/BLUE SHIELD

## 2016-04-12 VITALS — BP 101/62 | HR 95 | Temp 98.1°F | Resp 18 | Wt 150.1 lb

## 2016-04-12 DIAGNOSIS — C50411 Malignant neoplasm of upper-outer quadrant of right female breast: Secondary | ICD-10-CM

## 2016-04-12 DIAGNOSIS — Z5111 Encounter for antineoplastic chemotherapy: Secondary | ICD-10-CM

## 2016-04-12 DIAGNOSIS — R110 Nausea: Secondary | ICD-10-CM | POA: Diagnosis not present

## 2016-04-12 DIAGNOSIS — M898X9 Other specified disorders of bone, unspecified site: Secondary | ICD-10-CM | POA: Diagnosis not present

## 2016-04-12 LAB — CBC WITH DIFFERENTIAL/PLATELET
BASO%: 0.6 % (ref 0.0–2.0)
Basophils Absolute: 0.1 10*3/uL (ref 0.0–0.1)
EOS%: 0.1 % (ref 0.0–7.0)
Eosinophils Absolute: 0 10*3/uL (ref 0.0–0.5)
HCT: 37.4 % (ref 34.8–46.6)
HGB: 12.1 g/dL (ref 11.6–15.9)
LYMPH%: 12.7 % — ABNORMAL LOW (ref 14.0–49.7)
MCH: 29.3 pg (ref 25.1–34.0)
MCHC: 32.4 g/dL (ref 31.5–36.0)
MCV: 90.3 fL (ref 79.5–101.0)
MONO#: 0.7 10*3/uL (ref 0.1–0.9)
MONO%: 7.5 % (ref 0.0–14.0)
NEUT%: 79.1 % — AB (ref 38.4–76.8)
NEUTROS ABS: 6.8 10*3/uL — AB (ref 1.5–6.5)
PLATELETS: 225 10*3/uL (ref 145–400)
RBC: 4.14 10*6/uL (ref 3.70–5.45)
RDW: 15.1 % — ABNORMAL HIGH (ref 11.2–14.5)
WBC: 8.6 10*3/uL (ref 3.9–10.3)
lymph#: 1.1 10*3/uL (ref 0.9–3.3)

## 2016-04-12 LAB — COMPREHENSIVE METABOLIC PANEL
ALT: 32 U/L (ref 0–55)
ANION GAP: 8 meq/L (ref 3–11)
AST: 25 U/L (ref 5–34)
Albumin: 4 g/dL (ref 3.5–5.0)
Alkaline Phosphatase: 85 U/L (ref 40–150)
BUN: 12.9 mg/dL (ref 7.0–26.0)
CHLORIDE: 106 meq/L (ref 98–109)
CO2: 27 meq/L (ref 22–29)
CREATININE: 0.9 mg/dL (ref 0.6–1.1)
Calcium: 9.6 mg/dL (ref 8.4–10.4)
GLUCOSE: 97 mg/dL (ref 70–140)
Potassium: 3.9 mEq/L (ref 3.5–5.1)
SODIUM: 141 meq/L (ref 136–145)
Total Bilirubin: <0.3 mg/dL (ref 0.20–1.20)
Total Protein: 7.2 g/dL (ref 6.4–8.3)

## 2016-04-12 MED ORDER — SODIUM CHLORIDE 0.9 % IV SOLN
600.0000 mg/m2 | Freq: Once | INTRAVENOUS | Status: AC
  Administered 2016-04-12: 1040 mg via INTRAVENOUS
  Filled 2016-04-12: qty 52

## 2016-04-12 MED ORDER — SODIUM CHLORIDE 0.9 % IV SOLN
Freq: Once | INTRAVENOUS | Status: AC
  Administered 2016-04-12: 10:00:00 via INTRAVENOUS

## 2016-04-12 MED ORDER — SODIUM CHLORIDE 0.9 % IV SOLN
Freq: Once | INTRAVENOUS | Status: AC
  Administered 2016-04-12: 10:00:00 via INTRAVENOUS
  Filled 2016-04-12: qty 5

## 2016-04-12 MED ORDER — SODIUM CHLORIDE 0.9% FLUSH
10.0000 mL | INTRAVENOUS | Status: DC | PRN
  Administered 2016-04-12: 10 mL
  Filled 2016-04-12: qty 10

## 2016-04-12 MED ORDER — PALONOSETRON HCL INJECTION 0.25 MG/5ML
INTRAVENOUS | Status: AC
  Filled 2016-04-12: qty 5

## 2016-04-12 MED ORDER — HEPARIN SOD (PORK) LOCK FLUSH 100 UNIT/ML IV SOLN
500.0000 [IU] | Freq: Once | INTRAVENOUS | Status: AC | PRN
  Administered 2016-04-12: 500 [IU]
  Filled 2016-04-12: qty 5

## 2016-04-12 MED ORDER — PALONOSETRON HCL INJECTION 0.25 MG/5ML
0.2500 mg | Freq: Once | INTRAVENOUS | Status: AC
  Administered 2016-04-12: 0.25 mg via INTRAVENOUS

## 2016-04-12 MED ORDER — PEGFILGRASTIM 6 MG/0.6ML ~~LOC~~ PSKT
6.0000 mg | PREFILLED_SYRINGE | Freq: Once | SUBCUTANEOUS | Status: AC
  Administered 2016-04-12: 6 mg via SUBCUTANEOUS
  Filled 2016-04-12: qty 0.6

## 2016-04-12 MED ORDER — DOXORUBICIN HCL CHEMO IV INJECTION 2 MG/ML
60.0000 mg/m2 | Freq: Once | INTRAVENOUS | Status: AC
  Administered 2016-04-12: 104 mg via INTRAVENOUS
  Filled 2016-04-12: qty 52

## 2016-04-12 NOTE — Patient Instructions (Signed)
[facility] Discharge Instructions for Patients Receiving Chemotherapy  Today you received the following chemotherapy agents Adriamycin /Cytoxan  To help prevent nausea and vomiting after your treatment, we encourage you to take your nausea medication     If you develop nausea and vomiting that is not controlled by your nausea medication, call the clinic.   BELOW ARE SYMPTOMS THAT SHOULD BE REPORTED IMMEDIATELY:  *FEVER GREATER THAN 100.5 F  *CHILLS WITH OR WITHOUT FEVER  NAUSEA AND VOMITING THAT IS NOT CONTROLLED WITH YOUR NAUSEA MEDICATION  *UNUSUAL SHORTNESS OF BREATH  *UNUSUAL BRUISING OR BLEEDING  TENDERNESS IN MOUTH AND THROAT WITH OR WITHOUT PRESENCE OF ULCERS  *URINARY PROBLEMS  *BOWEL PROBLEMS  UNUSUAL RASH Items with * indicate a potential emergency and should be followed up as soon as possible.  Feel free to call the clinic you have any questions or concerns. The clinic phone number is [phone].  Please show the CHEMO ALERT CARD at check-in to the Emergency Department and triage nurse.   

## 2016-04-12 NOTE — Progress Notes (Signed)
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 [facility])   Staging form: Breast, AJCC 7th Edition     Clinical stage from 12/10/2015: Stage IIA (T2, N0, M0) - Unsigned       Staging comments: Staged at breast conference on 1.4.17  SUMMARY OF ONCOLOGIC HISTORY:   Breast cancer of upper-outer quadrant of right female breast ([place])   11/24/2015 Mammogram 2 suspicious groups of calcifications right breast UOQ posteriorly 2.2 x 2.3 x 2.5 cm (by U/S measured 1.2 x 0.8 x 1.2 cm); anteriorly 1.1 x 1.2 x 0.8 cm (not seen by ultrasound)   11/27/2015 Initial Diagnosis Right breast biopsy UOQ Posterior: IDC with DCIS with, LVI present, ER 100%, PR 20%, Ki-67 30%, HER-2 negative; ANTERIOR: Complex sclerosing lesions with calcifications   02/05/2016 Surgery Rt. Mastectomy: IDC Grade 1, 3 cm, 1/11 LN positive,with IG DCIS 0.1 cm margin, Diffuse LVI, cancer at axill tail. T2N1 (Stage 2B); Left mastectomy: Fibrocystic ch, Mammaprint high risk     03/15/2016 -  Chemotherapy Dose dense Adriamycin and Cytoxan 4 followed by Abraxane weekly 12    CHIEF COMPLIANT: Cycle 3 dose dense Adriamycin and Cytoxan  INTERVAL HISTORY: Kiara Spencer is a 45 year old with above-mentioned history of right breast cancer treated with mastectomy followed by adjuvant chemotherapy. She is today receiving third cycle of day of dose dense mass and Cytoxan. She tolerated that chemotherapy fairly well. She did have mild nausea for 1 day. She had fatigue as well as decreased taste. She had good appetite.  REVIEW OF SYSTEMS:   Constitutional: Denies fevers, chills or  abnormal weight loss, alopecia Eyes: Denies blurriness of vision Ears, nose, mouth, throat, and face: Denies mucositis or sore throat Respiratory: Denies cough, dyspnea or wheezes Cardiovascular: Denies palpitation, chest discomfort Gastrointestinal:  Denies nausea, heartburn or change in bowel habits Skin: Denies abnormal skin rashes Lymphatics: Denies new lymphadenopathy or easy bruising Neurological:Denies numbness, tingling or new weaknesses Behavioral/Psych: Mood is stable, no new changes  Extremities: No lower extremity edema Breast:  denies any pain or lumps or nodules in either breasts All other systems were reviewed with the patient and are negative.  I have reviewed the past medical history, past surgical history, social history and family history with the patient and they are unchanged from previous note.  ALLERGIES:  is allergic to cephalexin.  MEDICATIONS:  Current Outpatient Prescriptions  Medication Sig Dispense Refill  . Ascorbic Acid (VITAMIN C) 100 MG tablet Take 100 mg by mouth daily.    [name] dexamethasone (DECADRON) 4 MG tablet Take 1 tablet (4 mg total) by mouth daily. Take 1 tab day after chemo X 2 days 15 tablet 1  . diazepam (VALIUM) 2 MG tablet Take 1 tablet (2 mg total) by mouth every 12 (twelve) hours. 30 tablet 0  . ibuprofen (ADVIL,MOTRIN) 200 MG tablet Take 200 mg by mouth every 6 (six) hours as needed for fever or moderate pain. Reported on 03/29/2016    . lidocaine-prilocaine (EMLA) cream Apply to affected area once 30 g 3  . LORazepam (ATIVAN) 0.5 MG tablet Take 1 tablet (0.5 mg total) by mouth at bedtime. (Patient not taking: Reported on 03/29/2016) 30 tablet 0  . ondansetron (  ZOFRAN-ODT) 4 MG disintegrating tablet Take 1 tablet (4 mg total) by mouth every 6 (six) hours as needed for nausea. 20 tablet 0  . oxyCODONE-acetaminophen (PERCOCET/ROXICET) 5-325 MG tablet Take 1 tablet by mouth every 4 (four) hours as needed for severe pain.    . polyethylene glycol  (MIRALAX / GLYCOLAX) packet Take 17 g by mouth daily as needed for mild constipation. (Patient not taking: Reported on 03/29/2016) 14 each 0  . prochlorperazine (COMPAZINE) 10 MG tablet Take 1 tablet (10 mg total) by mouth every 6 (six) hours as needed (Nausea or vomiting). (Patient not taking: Reported on 03/08/2016) 30 tablet 1   No current facility-administered medications for this visit.    PHYSICAL EXAMINATION: ECOG PERFORMANCE STATUS: 1 - Symptomatic but completely ambulatory  Filed Vitals:   04/12/16 0837  BP: 101/62  Pulse: 95  Temp: 98.1 F (36.7 C)  Resp: 18   Filed Weights   04/12/16 0837  Weight: 150 lb 1.6 oz (68.085 kg)    GENERAL:alert, no distress and comfortable SKIN: skin color, texture, turgor are normal, no rashes or significant lesions EYES: normal, Conjunctiva are pink and non-injected, sclera clear OROPHARYNX:no exudate, no erythema and lips, buccal mucosa, and tongue normal  NECK: supple, thyroid normal size, non-tender, without nodularity LYMPH:  no palpable lymphadenopathy in the cervical, axillary or inguinal LUNGS: clear to auscultation and percussion with normal breathing effort HEART: regular rate & rhythm and no murmurs and no lower extremity edema ABDOMEN:abdomen soft, non-tender and normal bowel sounds MUSCULOSKELETAL:no cyanosis of digits and no clubbing  NEURO: alert & oriented x 3 with fluent speech, no focal motor/sensory deficits EXTREMITIES: No lower extremity edema  LABORATORY DATA:  I have reviewed the data as listed   Chemistry      Component Value Date/Time   NA 141 03/29/2016 0901   NA 140 03/02/2016 1045   K 4.2 03/29/2016 0901   K 3.9 03/02/2016 1045   CL 102 03/02/2016 1045   CO2 26 03/29/2016 0901   CO2 26 02/07/2016 0551   BUN 16.7 03/29/2016 0901   BUN 15 03/02/2016 1045   CREATININE 0.8 03/29/2016 0901   CREATININE 0.70 03/02/2016 1045      Component Value Date/Time   CALCIUM 9.7 03/29/2016 0901   CALCIUM 8.7*  02/07/2016 0551   ALKPHOS 78 03/29/2016 0901   ALKPHOS 57 01/28/2016 0850   AST 20 03/29/2016 0901   AST 21 01/28/2016 0850   ALT 24 03/29/2016 0901   ALT 14 01/28/2016 0850   BILITOT <0.30 03/29/2016 0901   BILITOT 0.2* 01/28/2016 0850       Lab Results  Component Value Date   WBC 8.6 04/12/2016   HGB 12.1 04/12/2016   HCT 37.4 04/12/2016   MCV 90.3 04/12/2016   PLT 225 04/12/2016   NEUTROABS 6.8* 04/12/2016     ASSESSMENT & PLAN:  Breast cancer of upper-outer quadrant of right female breast ([place]) Rt. 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 T2N1 (Stage 2B); Left mastectomy: Fibrocystic ch. Mammaprint high risk  Tumor board recommendation: 1. No indication for repeat resection since there is nothing further to move in the posterior side 2. Mammaprint: High risk, recommended systemic chemotherapy 3. Followed by radiation therapy (she may choose to undergo radiation in [place].) 4. Followed by antiestrogen therapy.   ------------------------------------------------------------------------------------------------------- Current Treatment: Cycle 3 day 1 AC  Chemotherapy Side Effects: 1. Bone pain from neulasta  use: claritin daily for 3-4 days 2. Mild hypersensitivity reaction during cycle 1: resolved with benadryl and pepcid. No adjustments to future chemo made. 3. Nausea grade 1  We also discussed the rationale for the current treatment regimen. Labs were reviewed in detail and were stable. Return to clinic in 2 weeks for cycle 4 of treatment.    No orders of the defined types were placed in this encounter.   The patient has a good understanding of the overall plan. she agrees with it. she will call with any problems that may develop before the next visit here.   Rulon Eisenmenger, MD 04/12/2016

## 2016-04-12 NOTE — Assessment & Plan Note (Signed)
Rt. 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 T2N1 (Stage 2B); Left mastectomy: Fibrocystic ch. Mammaprint high risk  Tumor board recommendation: 1. No indication for repeat resection since there is nothing further to move in the posterior side 2. Mammaprint: High risk, recommended systemic chemotherapy 3. Followed by radiation therapy (she may choose to undergo radiation in [place].) 4. Followed by antiestrogen therapy.   ------------------------------------------------------------------------------------------------------- Current Treatment: Cycle 3 day 1 AC  Chemotherapy Side Effects: 1. Bone pain from neulasta use: claritin daily for 3-4 days 2. Mild hypersensitivity reaction during cycle 1: resolved with benadryl and pepcid. No adjustments to future chemo made.  Labs were reviewed in detail and were stable. Return to clinic in 2 weeks for cycle 4 of treatment.

## 2016-04-26 ENCOUNTER — Other Ambulatory Visit (HOSPITAL_BASED_OUTPATIENT_CLINIC_OR_DEPARTMENT_OTHER): Payer: BLUE CROSS/BLUE SHIELD

## 2016-04-26 ENCOUNTER — Ambulatory Visit (HOSPITAL_BASED_OUTPATIENT_CLINIC_OR_DEPARTMENT_OTHER): Payer: BLUE CROSS/BLUE SHIELD | Admitting: Hematology and Oncology

## 2016-04-26 ENCOUNTER — Encounter: Payer: Self-pay | Admitting: Hematology and Oncology

## 2016-04-26 ENCOUNTER — Telehealth: Payer: Self-pay | Admitting: Hematology and Oncology

## 2016-04-26 ENCOUNTER — Ambulatory Visit: Payer: BLUE CROSS/BLUE SHIELD

## 2016-04-26 ENCOUNTER — Ambulatory Visit (HOSPITAL_BASED_OUTPATIENT_CLINIC_OR_DEPARTMENT_OTHER): Payer: BLUE CROSS/BLUE SHIELD

## 2016-04-26 VITALS — BP 101/62 | HR 79 | Temp 98.6°F | Resp 18 | Wt 150.7 lb

## 2016-04-26 DIAGNOSIS — R110 Nausea: Secondary | ICD-10-CM | POA: Diagnosis not present

## 2016-04-26 DIAGNOSIS — Z5111 Encounter for antineoplastic chemotherapy: Secondary | ICD-10-CM | POA: Diagnosis not present

## 2016-04-26 DIAGNOSIS — C50411 Malignant neoplasm of upper-outer quadrant of right female breast: Secondary | ICD-10-CM

## 2016-04-26 DIAGNOSIS — M898X9 Other specified disorders of bone, unspecified site: Secondary | ICD-10-CM

## 2016-04-26 LAB — COMPREHENSIVE METABOLIC PANEL
ALT: 22 U/L (ref 0–55)
ANION GAP: 9 meq/L (ref 3–11)
AST: 19 U/L (ref 5–34)
Albumin: 3.8 g/dL (ref 3.5–5.0)
Alkaline Phosphatase: 77 U/L (ref 40–150)
BUN: 11.5 mg/dL (ref 7.0–26.0)
CHLORIDE: 107 meq/L (ref 98–109)
CO2: 27 mEq/L (ref 22–29)
Calcium: 9.6 mg/dL (ref 8.4–10.4)
Creatinine: 0.8 mg/dL (ref 0.6–1.1)
EGFR: >90 mL/min/{1.73_m2} (ref >90)
GLUCOSE: 140 mg/dL (ref 70–140)
Potassium: 4.2 mEq/L (ref 3.5–5.1)
Sodium: 142 mEq/L (ref 136–145)
TOTAL PROTEIN: 7 g/dL (ref 6.4–8.3)
Total Bilirubin: <0.3 mg/dL (ref 0.20–1.20)

## 2016-04-26 LAB — CBC WITH DIFFERENTIAL/PLATELET
BASO%: 0.7 % (ref 0.0–2.0)
Basophils Absolute: 0.1 10*3/uL (ref 0.0–0.1)
EOS%: 0.1 % (ref 0.0–7.0)
Eosinophils Absolute: 0 10*3/uL (ref 0.0–0.5)
HEMATOCRIT: 33.9 % — AB (ref 34.8–46.6)
HGB: 11.3 g/dL — ABNORMAL LOW (ref 11.6–15.9)
LYMPH#: 1 10*3/uL (ref 0.9–3.3)
LYMPH%: 10.6 % — AB (ref 14.0–49.7)
MCH: 29.5 pg (ref 25.1–34.0)
MCHC: 33.3 g/dL (ref 31.5–36.0)
MCV: 88.8 fL (ref 79.5–101.0)
MONO#: 0.5 10*3/uL (ref 0.1–0.9)
MONO%: 5.4 % (ref 0.0–14.0)
NEUT%: 83.2 % — ABNORMAL HIGH (ref 38.4–76.8)
NEUTROS ABS: 7.9 10*3/uL — AB (ref 1.5–6.5)
PLATELETS: 244 10*3/uL (ref 145–400)
RBC: 3.81 10*6/uL (ref 3.70–5.45)
RDW: 14.8 % — ABNORMAL HIGH (ref 11.2–14.5)
WBC: 9.4 10*3/uL (ref 3.9–10.3)

## 2016-04-26 MED ORDER — PEGFILGRASTIM 6 MG/0.6ML ~~LOC~~ PSKT
6.0000 mg | PREFILLED_SYRINGE | Freq: Once | SUBCUTANEOUS | Status: AC
  Administered 2016-04-26: 6 mg via SUBCUTANEOUS
  Filled 2016-04-26: qty 0.6

## 2016-04-26 MED ORDER — PALONOSETRON HCL INJECTION 0.25 MG/5ML
0.2500 mg | Freq: Once | INTRAVENOUS | Status: AC
  Administered 2016-04-26: 0.25 mg via INTRAVENOUS

## 2016-04-26 MED ORDER — HEPARIN SOD (PORK) LOCK FLUSH 100 UNIT/ML IV SOLN
500.0000 [IU] | Freq: Once | INTRAVENOUS | Status: AC | PRN
  Administered 2016-04-26: 500 [IU]
  Filled 2016-04-26: qty 5

## 2016-04-26 MED ORDER — DOXORUBICIN HCL CHEMO IV INJECTION 2 MG/ML
60.0000 mg/m2 | Freq: Once | INTRAVENOUS | Status: AC
  Administered 2016-04-26: 104 mg via INTRAVENOUS
  Filled 2016-04-26: qty 52

## 2016-04-26 MED ORDER — SODIUM CHLORIDE 0.9 % IV SOLN
600.0000 mg/m2 | Freq: Once | INTRAVENOUS | Status: AC
  Administered 2016-04-26: 1040 mg via INTRAVENOUS
  Filled 2016-04-26: qty 50

## 2016-04-26 MED ORDER — SODIUM CHLORIDE 0.9 % IV SOLN
Freq: Once | INTRAVENOUS | Status: AC
  Administered 2016-04-26: 10:00:00 via INTRAVENOUS
  Filled 2016-04-26: qty 5

## 2016-04-26 MED ORDER — SODIUM CHLORIDE 0.9% FLUSH
10.0000 mL | INTRAVENOUS | Status: DC | PRN
  Administered 2016-04-26: 10 mL
  Filled 2016-04-26: qty 10

## 2016-04-26 MED ORDER — SODIUM CHLORIDE 0.9 % IV SOLN
Freq: Once | INTRAVENOUS | Status: AC
  Administered 2016-04-26: 10:00:00 via INTRAVENOUS

## 2016-04-26 MED ORDER — PALONOSETRON HCL INJECTION 0.25 MG/5ML
INTRAVENOUS | Status: AC
  Filled 2016-04-26: qty 5

## 2016-04-26 NOTE — Patient Instructions (Signed)
[facility] Discharge Instructions for Patients Receiving Chemotherapy  Today you received the following chemotherapy agents Adriamycin /Cytoxan  To help prevent nausea and vomiting after your treatment, we encourage you to take your nausea medication     If you develop nausea and vomiting that is not controlled by your nausea medication, call the clinic.   BELOW ARE SYMPTOMS THAT SHOULD BE REPORTED IMMEDIATELY:  *FEVER GREATER THAN 100.5 F  *CHILLS WITH OR WITHOUT FEVER  NAUSEA AND VOMITING THAT IS NOT CONTROLLED WITH YOUR NAUSEA MEDICATION  *UNUSUAL SHORTNESS OF BREATH  *UNUSUAL BRUISING OR BLEEDING  TENDERNESS IN MOUTH AND THROAT WITH OR WITHOUT PRESENCE OF ULCERS  *URINARY PROBLEMS  *BOWEL PROBLEMS  UNUSUAL RASH Items with * indicate a potential emergency and should be followed up as soon as possible.  Feel free to call the clinic you have any questions or concerns. The clinic phone number is [phone].  Please show the CHEMO ALERT CARD at check-in to the Emergency Department and triage nurse.   

## 2016-04-26 NOTE — Assessment & Plan Note (Signed)
Rt. 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 T2N1 (Stage 2B); Left mastectomy: Fibrocystic ch. Mammaprint high risk  Tumor board recommendation: 1. No indication for repeat resection since there is nothing further to move in the posterior side 2. Mammaprint: High risk, adjuvant chemotherapy with dose dense Adriamycin Cytoxan 4 followed by weekly Abraxane 12 3. Followed by radiation therapy (she may choose to undergo radiation in [place].) 4. Followed by antiestrogen therapy.   ------------------------------------------------------------------------------------------------------- Current Treatment: Cycle 4 day 1 AC  Chemotherapy Side Effects: 1. Bone pain from neulasta use: claritin daily for 3-4 days 2. Mild hypersensitivity reaction during cycle 1: resolved with benadryl and pepcid. No adjustments to future chemo made. 3. Nausea grade 1  We also discussed the rationale for the current treatment regimen. Labs were reviewed in detail and were stable. Return to clinic in 2 weeks for cycle 1/12 Abraxane treatment.

## 2016-04-26 NOTE — Telephone Encounter (Signed)
appt made and avs to be printed in treatment room °

## 2016-04-26 NOTE — Progress Notes (Signed)
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 [facility])   Staging form: Breast, AJCC 7th Edition     Clinical stage from 12/10/2015: Stage IIA (T2, N0, M0) - Unsigned       Staging comments: Staged at breast conference on 1.4.17    SUMMARY OF ONCOLOGIC HISTORY:   Breast cancer of upper-outer quadrant of right female breast ([place])   11/24/2015 Mammogram 2 suspicious groups of calcifications right breast UOQ posteriorly 2.2 x 2.3 x 2.5 cm (by U/S measured 1.2 x 0.8 x 1.2 cm); anteriorly 1.1 x 1.2 x 0.8 cm (not seen by ultrasound)   11/27/2015 Initial Diagnosis Right breast biopsy UOQ Posterior: IDC with DCIS with, LVI present, ER 100%, PR 20%, Ki-67 30%, HER-2 negative; ANTERIOR: Complex sclerosing lesions with calcifications   02/05/2016 Surgery Rt. Mastectomy: IDC Grade 1, 3 cm, 1/11 LN positive,with IG DCIS 0.1 cm margin, Diffuse LVI, cancer at axill tail. T2N1 (Stage 2B); Left mastectomy: Fibrocystic ch, Mammaprint high risk     03/15/2016 -  Chemotherapy Dose dense Adriamycin and Cytoxan 4 followed by Abraxane weekly 12    CHIEF COMPLIANT: Cycle 4 dose dense Adriamycin and Cytoxan  INTERVAL HISTORY: Kiara Spencer is a 45 year old with above-mentioned history of right breast cancer and one mastectomy and is currently on adjuvant chemotherapy today cycle 4 of dose dense Adriamycin and Cytoxan. She appears to be tolerating chemotherapy fairly well. She does get severe burning sensation in thewith the Cytoxan. Since they have slowed down and she has not had any such problems. Continues to have intermittent nausea resolved with  Zofran.  REVIEW OF SYSTEMS:   Constitutional: Denies fevers, chills or abnormal weight loss, alopecia Eyes: Denies blurriness of vision Ears, nose, mouth, throat, and face: Denies mucositis or sore throat Respiratory: Denies cough, dyspnea or wheezes Cardiovascular: Denies palpitation, chest discomfort Gastrointestinal:  Does have mild nausea which gets better with Zofran. Constipation improved with MiraLAX. Skin: Denies abnormal skin rashes Lymphatics: Denies new lymphadenopathy or easy bruising Neurological:Denies numbness, tingling or new weaknesses Behavioral/Psych: Mood is stable, no new changes  Extremities: No lower extremity edema Breast:  denies any pain or lumps or nodules in either breasts All other systems were reviewed with the patient and are negative.  I have reviewed the past medical history, past surgical history, social history and family history with the patient and they are unchanged from previous note.  ALLERGIES:  is allergic to cephalexin.  MEDICATIONS:  Current Outpatient Prescriptions  Medication Sig Dispense Refill  . Ascorbic Acid (VITAMIN C) 100 MG tablet Take 100 mg by mouth daily.    [name] dexamethasone (DECADRON) 4 MG tablet Take 1 tablet (4 mg total) by mouth daily. Take 1 tab day after chemo X 2 days 15 tablet 1  . diazepam (VALIUM) 2 MG tablet Take 1 tablet (2 mg total) by mouth every 12 (twelve) hours. 30 tablet 0  . ibuprofen (ADVIL,MOTRIN) 200 MG tablet Take 200 mg by mouth every 6 (six) hours as needed for fever or moderate pain. Reported on 03/29/2016    . lidocaine-prilocaine (EMLA) cream Apply to affected area once 30 g 3  . LORazepam (ATIVAN) 0.5 MG tablet Take 1 tablet (0.5 mg  total) by mouth at bedtime. (Patient not taking: Reported on 03/29/2016) 30 tablet 0  . ondansetron (ZOFRAN-ODT) 4 MG disintegrating tablet Take 1 tablet (4 mg total) by mouth every 6 (six) hours as needed for nausea. 20 tablet 0  . oxyCODONE-acetaminophen (PERCOCET/ROXICET)  5-325 MG tablet Take 1 tablet by mouth every 4 (four) hours as needed for severe pain.    . polyethylene glycol (MIRALAX / GLYCOLAX) packet Take 17 g by mouth daily as needed for mild constipation. (Patient not taking: Reported on 03/29/2016) 14 each 0  . prochlorperazine (COMPAZINE) 10 MG tablet Take 1 tablet (10 mg total) by mouth every 6 (six) hours as needed (Nausea or vomiting). (Patient not taking: Reported on 03/08/2016) 30 tablet 1   No current facility-administered medications for this visit.    PHYSICAL EXAMINATION: ECOG PERFORMANCE STATUS: 1 - Symptomatic but completely ambulatory  Filed Vitals:   04/26/16 0838  BP: 101/62  Pulse: 79  Temp: 98.6 F (37 C)  Resp: 18   Filed Weights   04/26/16 0838  Weight: 150 lb 11.2 oz (68.357 kg)    GENERAL:alert, no distress and comfortable SKIN: skin color, texture, turgor are normal, no rashes or significant lesions EYES: normal, Conjunctiva are pink and non-injected, sclera clear OROPHARYNX:no exudate, no erythema and lips, buccal mucosa, and tongue normal  NECK: supple, thyroid normal size, non-tender, without nodularity LYMPH:  no palpable lymphadenopathy in the cervical, axillary or inguinal LUNGS: clear to auscultation and percussion with normal breathing effort HEART: regular rate & rhythm and no murmurs and no lower extremity edema ABDOMEN:abdomen soft, non-tender and normal bowel sounds MUSCULOSKELETAL:no cyanosis of digits and no clubbing  NEURO: alert & oriented x 3 with fluent speech, no focal motor/sensory deficits EXTREMITIES: No lower extremity edema  LABORATORY DATA:  I have reviewed the data as listed   Chemistry      Component Value Date/Time   NA 141 04/12/2016 0825   NA 140 03/02/2016 1045   K 3.9 04/12/2016 0825   K 3.9 03/02/2016 1045   CL 102 03/02/2016 1045   CO2 27 04/12/2016 0825   CO2 26 02/07/2016 0551   BUN 12.9 04/12/2016 0825   BUN 15 03/02/2016 1045   CREATININE 0.9 04/12/2016 0825    CREATININE 0.70 03/02/2016 1045      Component Value Date/Time   CALCIUM 9.6 04/12/2016 0825   CALCIUM 8.7* 02/07/2016 0551   ALKPHOS 85 04/12/2016 0825   ALKPHOS 57 01/28/2016 0850   AST 25 04/12/2016 0825   AST 21 01/28/2016 0850   ALT 32 04/12/2016 0825   ALT 14 01/28/2016 0850   BILITOT <0.30 04/12/2016 0825   BILITOT 0.2* 01/28/2016 0850       Lab Results  Component Value Date   WBC 9.4 04/26/2016   HGB 11.3* 04/26/2016   HCT 33.9* 04/26/2016   MCV 88.8 04/26/2016   PLT 244 04/26/2016   NEUTROABS 7.9* 04/26/2016     ASSESSMENT & PLAN:  Breast cancer of upper-outer quadrant of right female breast ([place]) Rt. 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 T2N1 (Stage 2B); Left mastectomy: Fibrocystic ch. Mammaprint high risk  Tumor board recommendation: 1. No indication for repeat resection since there is nothing further to move in the posterior side 2. Mammaprint: High risk, adjuvant chemotherapy with dose dense Adriamycin Cytoxan 4 followed by weekly Abraxane 12 3. Followed by radiation therapy (she may choose to undergo  radiation in [place].) 4. Followed by antiestrogen therapy.   ------------------------------------------------------------------------------------------------------- Current Treatment: Cycle 4 day 1 AC  Chemotherapy Side Effects: 1. Bone pain from neulasta use: claritin daily for 3-4 days 2. Mild hypersensitivity reaction during cycle 1: resolved with benadryl and pepcid. No adjustments to future chemo made. 3. Nausea grade 1: Zofran help  Monitoring closely for toxicities Labs were reviewed in detail and were stable. Return to clinic in 2 weeks for cycle 1/12 Abraxane treatment.   No orders of the defined types were placed in this encounter.   The patient has a good understanding of the overall plan. she agrees with it. she will call with any problems that may develop  before the next visit here.   Rulon Eisenmenger, MD 04/26/2016

## 2016-04-27 ENCOUNTER — Other Ambulatory Visit: Payer: Self-pay | Admitting: Hematology and Oncology

## 2016-04-27 DIAGNOSIS — C50411 Malignant neoplasm of upper-outer quadrant of right female breast: Secondary | ICD-10-CM

## 2016-05-10 ENCOUNTER — Ambulatory Visit (HOSPITAL_BASED_OUTPATIENT_CLINIC_OR_DEPARTMENT_OTHER): Payer: BLUE CROSS/BLUE SHIELD | Admitting: Hematology and Oncology

## 2016-05-10 ENCOUNTER — Ambulatory Visit (HOSPITAL_BASED_OUTPATIENT_CLINIC_OR_DEPARTMENT_OTHER): Payer: BLUE CROSS/BLUE SHIELD

## 2016-05-10 ENCOUNTER — Telehealth: Payer: Self-pay | Admitting: Hematology and Oncology

## 2016-05-10 ENCOUNTER — Encounter: Payer: Self-pay | Admitting: Hematology and Oncology

## 2016-05-10 ENCOUNTER — Other Ambulatory Visit (HOSPITAL_BASED_OUTPATIENT_CLINIC_OR_DEPARTMENT_OTHER): Payer: BLUE CROSS/BLUE SHIELD

## 2016-05-10 VITALS — BP 106/61 | HR 87 | Temp 98.6°F | Resp 18 | Ht 61.0 in | Wt 149.8 lb

## 2016-05-10 DIAGNOSIS — Z5111 Encounter for antineoplastic chemotherapy: Secondary | ICD-10-CM

## 2016-05-10 DIAGNOSIS — C50411 Malignant neoplasm of upper-outer quadrant of right female breast: Secondary | ICD-10-CM

## 2016-05-10 LAB — COMPREHENSIVE METABOLIC PANEL
ALT: 11 U/L (ref 0–55)
ANION GAP: 7 meq/L (ref 3–11)
AST: 14 U/L (ref 5–34)
Albumin: 3.8 g/dL (ref 3.5–5.0)
Alkaline Phosphatase: 74 U/L (ref 40–150)
BUN: 7.5 mg/dL (ref 7.0–26.0)
CO2: 27 meq/L (ref 22–29)
CREATININE: 0.7 mg/dL (ref 0.6–1.1)
Calcium: 9.6 mg/dL (ref 8.4–10.4)
Chloride: 108 mEq/L (ref 98–109)
EGFR: >90 mL/min/{1.73_m2} (ref >90)
GLUCOSE: 102 mg/dL (ref 70–140)
Potassium: 4.4 mEq/L (ref 3.5–5.1)
Sodium: 142 mEq/L (ref 136–145)
TOTAL PROTEIN: 6.9 g/dL (ref 6.4–8.3)

## 2016-05-10 LAB — CBC WITH DIFFERENTIAL/PLATELET
BASO%: 0.7 % (ref 0.0–2.0)
Basophils Absolute: 0.1 10*3/uL (ref 0.0–0.1)
EOS%: 0.2 % (ref 0.0–7.0)
Eosinophils Absolute: 0 10*3/uL (ref 0.0–0.5)
HEMATOCRIT: 32.5 % — AB (ref 34.8–46.6)
HGB: 10.4 g/dL — ABNORMAL LOW (ref 11.6–15.9)
LYMPH#: 1.1 10*3/uL (ref 0.9–3.3)
LYMPH%: 12.1 % — AB (ref 14.0–49.7)
MCH: 29 pg (ref 25.1–34.0)
MCHC: 32 g/dL (ref 31.5–36.0)
MCV: 90.5 fL (ref 79.5–101.0)
MONO#: 0.8 10*3/uL (ref 0.1–0.9)
MONO%: 8.9 % (ref 0.0–14.0)
NEUT%: 78.1 % — ABNORMAL HIGH (ref 38.4–76.8)
NEUTROS ABS: 6.8 10*3/uL — AB (ref 1.5–6.5)
PLATELETS: 215 10*3/uL (ref 145–400)
RBC: 3.59 10*6/uL — AB (ref 3.70–5.45)
RDW: 15.6 % — ABNORMAL HIGH (ref 11.2–14.5)
WBC: 8.8 10*3/uL (ref 3.9–10.3)

## 2016-05-10 MED ORDER — PROCHLORPERAZINE MALEATE 10 MG PO TABS
10.0000 mg | ORAL_TABLET | Freq: Once | ORAL | Status: AC
  Administered 2016-05-10: 10 mg via ORAL

## 2016-05-10 MED ORDER — HEPARIN SOD (PORK) LOCK FLUSH 100 UNIT/ML IV SOLN
500.0000 [IU] | Freq: Once | INTRAVENOUS | Status: AC | PRN
  Administered 2016-05-10: 500 [IU]
  Filled 2016-05-10: qty 5

## 2016-05-10 MED ORDER — PROCHLORPERAZINE MALEATE 10 MG PO TABS
ORAL_TABLET | ORAL | Status: AC
  Filled 2016-05-10: qty 1

## 2016-05-10 MED ORDER — SODIUM CHLORIDE 0.9 % IV SOLN
Freq: Once | INTRAVENOUS | Status: AC
  Administered 2016-05-10: 10:00:00 via INTRAVENOUS

## 2016-05-10 MED ORDER — PACLITAXEL PROTEIN-BOUND CHEMO INJECTION 100 MG
80.0000 mg/m2 | Freq: Once | INTRAVENOUS | Status: AC
  Administered 2016-05-10: 150 mg via INTRAVENOUS
  Filled 2016-05-10: qty 30

## 2016-05-10 MED ORDER — SODIUM CHLORIDE 0.9% FLUSH
10.0000 mL | INTRAVENOUS | Status: DC | PRN
  Administered 2016-05-10: 10 mL
  Filled 2016-05-10: qty 10

## 2016-05-10 NOTE — Progress Notes (Signed)
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 [facility])   Staging form: Breast, AJCC 7th Edition     Clinical stage from 12/10/2015: Stage IIA (T2, N0, M0) - Unsigned       Staging comments: Staged at breast conference on 1.4.17  SUMMARY OF ONCOLOGIC HISTORY:   Breast cancer of upper-outer quadrant of right female breast ([place])   11/24/2015 Mammogram 2 suspicious groups of calcifications right breast UOQ posteriorly 2.2 x 2.3 x 2.5 cm (by U/S measured 1.2 x 0.8 x 1.2 cm); anteriorly 1.1 x 1.2 x 0.8 cm (not seen by ultrasound)   11/27/2015 Initial Diagnosis Right breast biopsy UOQ Posterior: IDC with DCIS with, LVI present, ER 100%, PR 20%, Ki-67 30%, HER-2 negative; ANTERIOR: Complex sclerosing lesions with calcifications   02/05/2016 Surgery Rt. Mastectomy: IDC Grade 1, 3 cm, 1/11 LN positive,with IG DCIS 0.1 cm margin, Diffuse LVI, cancer at axill tail. T2N1 (Stage 2B); Left mastectomy: Fibrocystic ch, Mammaprint high risk     03/15/2016 -  Chemotherapy Dose dense Adriamycin and Cytoxan 4 followed by Abraxane weekly 12    CHIEF COMPLIANT: Cycle 1 Abraxane  INTERVAL HISTORY: Amazing Cowman is a 45 yr old with the above-mentioned history of right breast cancer currently on adjuvant chemotherapy and today cycle 1 of Abraxane. She has tolerated 4 cycles of dose dense Adriamycin Cytoxan fairly well. She is scheduled for an echocardiogram to assess cardiac changes related to Adriamycin. Denies any current nausea or vomiting. Although she did have nausea with the chemotherapy.  REVIEW OF SYSTEMS:   Constitutional: Denies fevers,  chills or abnormal weight loss Eyes: Denies blurriness of vision Ears, nose, mouth, throat, and face: Denies mucositis or sore throat Respiratory: Denies cough, dyspnea or wheezes Cardiovascular: Denies palpitation, chest discomfort Gastrointestinal:  Denies nausea, heartburn or change in bowel habits Skin: Denies abnormal skin rashes Lymphatics: Denies new lymphadenopathy or easy bruising Neurological:Denies numbness, tingling or new weaknesses Behavioral/Psych: Mood is stable, no new changes  Extremities: No lower extremity edema All other systems were reviewed with the patient and are negative.  I have reviewed the past medical history, past surgical history, social history and family history with the patient and they are unchanged from previous note.  ALLERGIES:  is allergic to cephalexin.  MEDICATIONS:  Current Outpatient Prescriptions  Medication Sig Dispense Refill  . Ascorbic Acid (VITAMIN C) 100 MG tablet Take 100 mg by mouth daily.    . diazepam (VALIUM) 2 MG tablet Take 1 tablet (2 mg total) by mouth every 12 (twelve) hours. 30 tablet 0  . ibuprofen (ADVIL,MOTRIN) 200 MG tablet Take 200 mg by mouth every 6 (six) hours as needed for fever or moderate pain. Reported on 03/29/2016    . ondansetron (ZOFRAN-ODT) 4 MG disintegrating tablet Take 1 tablet (4 mg total) by mouth every 6 (six) hours as needed for nausea. 20 tablet 0  . oxyCODONE-acetaminophen (PERCOCET/ROXICET) 5-325 MG tablet Take 1 tablet by mouth every 4 (four) hours as needed for severe pain.    . polyethylene glycol (MIRALAX / GLYCOLAX) packet Take 17 g by mouth daily as needed for mild constipation. (Patient not taking: Reported on 03/29/2016) 14 each 0   No current  facility-administered medications for this visit.   Facility-Administered Medications Ordered in Other Visits  Medication Dose Route Frequency Provider Last Rate Last Dose  . heparin lock flush 100 unit/mL  500 Units Intracatheter Once PRN Nicholas Lose,  MD      . sodium chloride flush (NS) 0.9 % injection 10 mL  10 mL Intracatheter PRN Nicholas Lose, MD        PHYSICAL EXAMINATION: ECOG PERFORMANCE STATUS: 1 - Symptomatic but completely ambulatory  Filed Vitals:   05/10/16 0903  BP: 106/61  Pulse: 87  Temp: 98.6 F (37 C)  Resp: 18   Filed Weights   05/10/16 0903  Weight: 149 lb 12.8 oz (67.949 kg)    GENERAL:alert, no distress and comfortable SKIN: skin color, texture, turgor are normal, no rashes or significant lesions EYES: normal, Conjunctiva are pink and non-injected, sclera clear OROPHARYNX:no exudate, no erythema and lips, buccal mucosa, and tongue normal  NECK: supple, thyroid normal size, non-tender, without nodularity LYMPH:  no palpable lymphadenopathy in the cervical, axillary or inguinal LUNGS: clear to auscultation and percussion with normal breathing effort HEART: regular rate & rhythm and no murmurs and no lower extremity edema ABDOMEN:abdomen soft, non-tender and normal bowel sounds MUSCULOSKELETAL:no cyanosis of digits and no clubbing  NEURO: alert & oriented x 3 with fluent speech, no focal motor/sensory deficits EXTREMITIES: No lower extremity edema  LABORATORY DATA:  I have reviewed the data as listed   Chemistry      Component Value Date/Time   NA 142 05/10/2016 0853   NA 140 03/02/2016 1045   K 4.4 05/10/2016 0853   K 3.9 03/02/2016 1045   CL 102 03/02/2016 1045   CO2 27 05/10/2016 0853   CO2 26 02/07/2016 0551   BUN 7.5 05/10/2016 0853   BUN 15 03/02/2016 1045   CREATININE 0.7 05/10/2016 0853   CREATININE 0.70 03/02/2016 1045      Component Value Date/Time   CALCIUM 9.6 05/10/2016 0853   CALCIUM 8.7* 02/07/2016 0551   ALKPHOS 74 05/10/2016 0853   ALKPHOS 57 01/28/2016 0850   AST 14 05/10/2016 0853   AST 21 01/28/2016 0850   ALT 11 05/10/2016 0853   ALT 14 01/28/2016 0850   BILITOT <0.30 05/10/2016 0853   BILITOT 0.2* 01/28/2016 0850       Lab Results  Component Value Date    WBC 8.8 05/10/2016   HGB 10.4* 05/10/2016   HCT 32.5* 05/10/2016   MCV 90.5 05/10/2016   PLT 215 05/10/2016   NEUTROABS 6.8* 05/10/2016     ASSESSMENT & PLAN:  Breast cancer of upper-outer quadrant of right female breast ([place]) Rt. 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 T2N1 (Stage 2B); Left mastectomy: Fibrocystic ch. Mammaprint high risk  Tumor board recommendation: 1. No indication for repeat resection since there is nothing further to move in the posterior side 2. Mammaprint: High risk, adjuvant chemotherapy with dose dense Adriamycin Cytoxan 4 followed by weekly Abraxane 12 3. Followed by radiation therapy (she may choose to undergo radiation in [place].) 4. Followed by antiestrogen therapy.   ------------------------------------------------------------------------------------------------------- Current Treatment: Completed 4 cycles of AC, today is cycle 1/12 Abraxane  Chemotherapy Side Effects: 1. Bone pain from neulasta use: claritin daily for 3-4 days 2. Mild hypersensitivity reaction during cycle 1: resolved with benadryl and pepcid. No adjustments to future chemo made. 3. Nausea grade 1: Zofran helps 4. Fatigue due to chemotherapy 5.  Alopecia  Monitoring closely for toxicities Labs were reviewed in detail. Return to clinic in 2 weeks for cycle 3/12 Abraxane treatment.    No orders of the defined types were placed in this encounter.   The patient has a good understanding of the overall plan. she agrees with it. she will call with any problems that may develop before the next visit here.   Rulon Eisenmenger, MD 05/10/2016

## 2016-05-10 NOTE — Assessment & Plan Note (Signed)
Rt. 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 T2N1 (Stage 2B); Left mastectomy: Fibrocystic ch. Mammaprint high risk  Tumor board recommendation: 1. No indication for repeat resection since there is nothing further to move in the posterior side 2. Mammaprint: High risk, adjuvant chemotherapy with dose dense Adriamycin Cytoxan 4 followed by weekly Abraxane 12 3. Followed by radiation therapy (she may choose to undergo radiation in [place].) 4. Followed by antiestrogen therapy.   ------------------------------------------------------------------------------------------------------- Current Treatment: Completed 4 cycles of AC, today is cycle 1/12 Abraxane  Chemotherapy Side Effects: 1. Bone pain from neulasta use: claritin daily for 3-4 days 2. Mild hypersensitivity reaction during cycle 1: resolved with benadryl and pepcid. No adjustments to future chemo made. 3. Nausea grade 1: Zofran helps  Monitoring closely for toxicities Labs were reviewed in detail. Return to clinic in 2 weeks for cycle 3/12 Abraxane treatment.

## 2016-05-10 NOTE — Telephone Encounter (Signed)
appt made and avs printed °

## 2016-05-10 NOTE — Progress Notes (Signed)
Pt tolerated first time abraxane chemo w/o any complaints or issue. Follow up phone call placed for tomorrow in "in basket". Pt VSS. AVS printed with education information.

## 2016-05-10 NOTE — Patient Instructions (Signed)
[place] Discharge Instructions for Patients Receiving Chemotherapy  Today you received the following chemotherapy agents Abraxane  To help prevent nausea and vomiting after your treatment, we encourage you to take your nausea medication as directed by your MD. If you develop nausea and vomiting that is not controlled by your nausea medication, call the clinic.   BELOW ARE SYMPTOMS THAT SHOULD BE REPORTED IMMEDIATELY:  *FEVER GREATER THAN 100.5 F  *CHILLS WITH OR WITHOUT FEVER  NAUSEA AND VOMITING THAT IS NOT CONTROLLED WITH YOUR NAUSEA MEDICATION  *UNUSUAL SHORTNESS OF BREATH  *UNUSUAL BRUISING OR BLEEDING  TENDERNESS IN MOUTH AND THROAT WITH OR WITHOUT PRESENCE OF ULCERS  *URINARY PROBLEMS  *BOWEL PROBLEMS  UNUSUAL RASH Items with * indicate a potential emergency and should be followed up as soon as possible.  Feel free to call the clinic you have any questions or concerns. The clinic phone number is [phone].  Please show the [place] at check-in to the Emergency Department and triage nurse.   Nanoparticle Albumin-Bound Paclitaxel injection ABRAXANE What is this medicine? NANOPARTICLE ALBUMIN-BOUND PACLITAXEL (Na no PAHR ti kuhl al BYOO muhn-bound PAK li TAX el) is a chemotherapy drug. It targets fast dividing cells, like cancer cells, and causes these cells to die. This medicine is used to treat advanced breast cancer and advanced lung cancer. This medicine may be used for other purposes; ask your health care provider or pharmacist if you have questions. What should I tell my health care provider before I take this medicine? They need to know if you have any of these conditions: -kidney disease -liver disease -low blood counts, like low platelets, red blood cells, or white blood cells -recent or ongoing radiation therapy -an unusual or allergic reaction to paclitaxel, albumin, other chemotherapy, other medicines, foods, dyes, or  preservatives -pregnant or trying to get pregnant -breast-feeding How should I use this medicine? This drug is given as an infusion into a vein. It is administered in a hospital or clinic by a specially trained health care professional. Talk to your pediatrician regarding the use of this medicine in children. Special care may be needed. Overdosage: If you think you have taken too much of this medicine contact a poison control center or emergency room at once. NOTE: This medicine is only for you. Do not share this medicine with others. What if I miss a dose? It is important not to miss your dose. Call your doctor or health care professional if you are unable to keep an appointment. What may interact with this medicine? -cyclosporine -diazepam -ketoconazole -medicines to increase blood counts like filgrastim, pegfilgrastim, sargramostim -other chemotherapy drugs like cisplatin, doxorubicin, epirubicin, etoposide, teniposide, vincristine -quinidine -testosterone -vaccines -verapamil Talk to your doctor or health care professional before taking any of these medicines: -acetaminophen -aspirin -ibuprofen -ketoprofen -naproxen This list may not describe all possible interactions. Give your health care provider a list of all the medicines, herbs, non-prescription drugs, or dietary supplements you use. Also tell them if you smoke, drink alcohol, or use illegal drugs. Some items may interact with your medicine. What should I watch for while using this medicine? Your condition will be monitored carefully while you are receiving this medicine. You will need important blood work done while you are taking this medicine. This drug may make you feel generally unwell. This is not uncommon, as chemotherapy can affect healthy cells as well as cancer cells. Report any side effects. Continue your course of treatment even though  you feel ill unless your doctor tells you to stop. In some cases, you may be  given additional medicines to help with side effects. Follow all directions for their use. Call your doctor or health care professional for advice if you get a fever, chills or sore throat, or other symptoms of a cold or flu. Do not treat yourself. This drug decreases your body's ability to fight infections. Try to avoid being around people who are sick. This medicine may increase your risk to bruise or bleed. Call your doctor or health care professional if you notice any unusual bleeding. Be careful brushing and flossing your teeth or using a toothpick because you may get an infection or bleed more easily. If you have any dental work done, tell your dentist you are receiving this medicine. Avoid taking products that contain aspirin, acetaminophen, ibuprofen, naproxen, or ketoprofen unless instructed by your doctor. These medicines may hide a fever. Do not become pregnant while taking this medicine. Women should inform their doctor if they wish to become pregnant or think they might be pregnant. There is a potential for serious side effects to an unborn child. Talk to your health care professional or pharmacist for more information. Do not breast-feed an infant while taking this medicine. Men are advised not to father a child while receiving this medicine. What side effects may I notice from receiving this medicine? Side effects that you should report to your doctor or health care professional as soon as possible: -allergic reactions like skin rash, itching or hives, swelling of the face, lips, or tongue -low blood counts - This drug may decrease the number of white blood cells, red blood cells and platelets. You may be at increased risk for infections and bleeding. -signs of infection - fever or chills, cough, sore throat, pain or difficulty passing urine -signs of decreased platelets or bleeding - bruising, pinpoint red spots on the skin, black, tarry stools, nosebleeds -signs of decreased red blood  cells - unusually weak or tired, fainting spells, lightheadedness -breathing problems -changes in vision -chest pain -high or low blood pressure -mouth sores -nausea and vomiting -pain, swelling, redness or irritation at the injection site -pain, tingling, numbness in the hands or feet -slow or irregular heartbeat -swelling of the ankle, feet, hands Side effects that usually do not require medical attention (report to your doctor or health care professional if they continue or are bothersome): -aches, pains -changes in the color of fingernails -diarrhea -hair loss -loss of appetite This list may not describe all possible side effects. Call your doctor for medical advice about side effects. You may report side effects to FDA at 1-800-FDA-1088. Where should I keep my medicine? This drug is given in a hospital or clinic and will not be stored at home. NOTE: This sheet is a summary. It may not cover all possible information. If you have questions about this medicine, talk to your doctor, pharmacist, or health care provider.    2016, Elsevier/Gold Standard. (2013-01-15 16:48:50)

## 2016-05-11 ENCOUNTER — Telehealth: Payer: Self-pay

## 2016-05-11 NOTE — Telephone Encounter (Signed)
[facility] for chemotherapy F/U.  Patient is doing well.  Denies n/v.  Denies any new side effects or symptoms.  Bowel and bladder is functioning well.  Eating and drinking well and I instructed to drink 64 oz minimum daily or at least the day before, of and after treatment.  Denies questions at this time and encouraged to call if needed.  Reviewed how to call after hours in the case of an emergency.

## 2016-05-11 NOTE — Telephone Encounter (Signed)
-----   Message from [place], RN sent at 05/10/2016 10:28 AM EDT ----- Regarding: DR. Lindi Adie F/U CHEMO PHONE CALL DR. Lindi Adie. FU PHONE [place].

## 2016-05-17 ENCOUNTER — Other Ambulatory Visit (HOSPITAL_BASED_OUTPATIENT_CLINIC_OR_DEPARTMENT_OTHER): Payer: BLUE CROSS/BLUE SHIELD

## 2016-05-17 ENCOUNTER — Encounter (HOSPITAL_COMMUNITY): Payer: Self-pay | Admitting: Internal Medicine

## 2016-05-17 ENCOUNTER — Ambulatory Visit (HOSPITAL_COMMUNITY)
Admission: RE | Admit: 2016-05-17 | Discharge: 2016-05-17 | Disposition: A | Discharge status: Home / Self Care | Payer: BLUE CROSS/BLUE SHIELD | Source: Ambulatory Visit | Attending: Obstetrics and Gynecology | Admitting: Obstetrics and Gynecology

## 2016-05-17 ENCOUNTER — Ambulatory Visit (HOSPITAL_BASED_OUTPATIENT_CLINIC_OR_DEPARTMENT_OTHER): Payer: BLUE CROSS/BLUE SHIELD

## 2016-05-17 ENCOUNTER — Ambulatory Visit (HOSPITAL_BASED_OUTPATIENT_CLINIC_OR_DEPARTMENT_OTHER)
Admission: RE | Admit: 2016-05-17 | Discharge: 2016-05-17 | Disposition: A | Discharge status: Home / Self Care | Payer: BLUE CROSS/BLUE SHIELD | Source: Ambulatory Visit | Attending: Internal Medicine | Admitting: Internal Medicine

## 2016-05-17 VITALS — BP 101/69 | HR 80 | Resp 18 | Wt 149.5 lb

## 2016-05-17 VITALS — BP 105/62 | HR 96 | Temp 98.8°F | Resp 18

## 2016-05-17 DIAGNOSIS — C50411 Malignant neoplasm of upper-outer quadrant of right female breast: Secondary | ICD-10-CM | POA: Diagnosis present

## 2016-05-17 DIAGNOSIS — Z5111 Encounter for antineoplastic chemotherapy: Secondary | ICD-10-CM | POA: Diagnosis not present

## 2016-05-17 LAB — ECHOCARDIOGRAM COMPLETE
EERAT: 4.71
EWDT: 289 ms
FS: 36 % (ref 28–44)
IVS/LV PW RATIO, ED: 1.06
LA diam end sys: 32 mm
LA diam index: 1.92 cm/m2
LA vol index: 11.5 mL/m2
LASIZE: 32 mm
LAVOL: 19.2 mL
LAVOLA4C: 15.1 mL
LDCA: 3.14 cm2
LV E/e' medial: 4.71
LV PW d: 10.2 mm — AB (ref 0.6–1.1)
LV TDI E'LATERAL: 10.4
LV TDI E'MEDIAL: 10.8
LV e' LATERAL: 10.4 cm/s
LVEEAVG: 4.71
LVOTD: 20 mm
MV Dec: 289
MV pk A vel: 47.1 m/s
MVPKEVEL: 49 m/s
RV TAPSE: 12.4 mm

## 2016-05-17 LAB — COMPREHENSIVE METABOLIC PANEL
ALBUMIN: 4.1 g/dL (ref 3.5–5.0)
ALK PHOS: 62 U/L (ref 40–150)
ALT: 33 U/L (ref 0–55)
AST: 26 U/L (ref 5–34)
Anion Gap: 9 mEq/L (ref 3–11)
BUN: 10.5 mg/dL (ref 7.0–26.0)
CALCIUM: 9.8 mg/dL (ref 8.4–10.4)
CO2: 26 mEq/L (ref 22–29)
CREATININE: 0.8 mg/dL (ref 0.6–1.1)
Chloride: 106 mEq/L (ref 98–109)
EGFR: >90 mL/min/{1.73_m2} (ref >90)
Glucose: 103 mg/dl (ref 70–140)
POTASSIUM: 4 meq/L (ref 3.5–5.1)
Sodium: 141 mEq/L (ref 136–145)
Total Bilirubin: 0.4 mg/dL (ref 0.20–1.20)
Total Protein: 7.4 g/dL (ref 6.4–8.3)

## 2016-05-17 LAB — CBC WITH DIFFERENTIAL/PLATELET
BASO%: 0.8 % (ref 0.0–2.0)
BASOS ABS: 0.1 10*3/uL (ref 0.0–0.1)
EOS ABS: 0 10*3/uL (ref 0.0–0.5)
EOS%: 0.5 % (ref 0.0–7.0)
HEMATOCRIT: 31.3 % — AB (ref 34.8–46.6)
HEMOGLOBIN: 10 g/dL — AB (ref 11.6–15.9)
LYMPH#: 0.7 10*3/uL — AB (ref 0.9–3.3)
LYMPH%: 11.3 % — ABNORMAL LOW (ref 14.0–49.7)
MCH: 29.1 pg (ref 25.1–34.0)
MCHC: 31.9 g/dL (ref 31.5–36.0)
MCV: 91 fL (ref 79.5–101.0)
MONO#: 0.8 10*3/uL (ref 0.1–0.9)
MONO%: 13.7 % (ref 0.0–14.0)
NEUT#: 4.4 10*3/uL (ref 1.5–6.5)
NEUT%: 73.7 % (ref 38.4–76.8)
PLATELETS: 364 10*3/uL (ref 145–400)
RBC: 3.44 10*6/uL — ABNORMAL LOW (ref 3.70–5.45)
RDW: 16.2 % — AB (ref 11.2–14.5)
WBC: 5.9 10*3/uL (ref 3.9–10.3)

## 2016-05-17 MED ORDER — PROCHLORPERAZINE MALEATE 10 MG PO TABS
ORAL_TABLET | ORAL | Status: AC
  Filled 2016-05-17: qty 1

## 2016-05-17 MED ORDER — SODIUM CHLORIDE 0.9% FLUSH
10.0000 mL | INTRAVENOUS | Status: DC | PRN
  Administered 2016-05-17: 10 mL
  Filled 2016-05-17: qty 10

## 2016-05-17 MED ORDER — PACLITAXEL PROTEIN-BOUND CHEMO INJECTION 100 MG
80.0000 mg/m2 | Freq: Once | INTRAVENOUS | Status: AC
  Administered 2016-05-17: 150 mg via INTRAVENOUS
  Filled 2016-05-17: qty 30

## 2016-05-17 MED ORDER — SODIUM CHLORIDE 0.9 % IV SOLN
Freq: Once | INTRAVENOUS | Status: AC
  Administered 2016-05-17: 15:00:00 via INTRAVENOUS

## 2016-05-17 MED ORDER — PROCHLORPERAZINE MALEATE 10 MG PO TABS
10.0000 mg | ORAL_TABLET | Freq: Once | ORAL | Status: AC
  Administered 2016-05-17: 10 mg via ORAL

## 2016-05-17 MED ORDER — HEPARIN SOD (PORK) LOCK FLUSH 100 UNIT/ML IV SOLN
500.0000 [IU] | Freq: Once | INTRAVENOUS | Status: AC | PRN
  Administered 2016-05-17: 500 [IU]
  Filled 2016-05-17: qty 5

## 2016-05-17 NOTE — Patient Instructions (Signed)
[facility] Discharge Instructions for Patients Receiving Chemotherapy  Today you received the following chemotherapy agents Abraxane  To help prevent nausea and vomiting after your treatment, we encourage you to take your nausea medication as directed by your MD. If you develop nausea and vomiting that is not controlled by your nausea medication, call the clinic.   BELOW ARE SYMPTOMS THAT SHOULD BE REPORTED IMMEDIATELY:  *FEVER GREATER THAN 100.5 F  *CHILLS WITH OR WITHOUT FEVER  NAUSEA AND VOMITING THAT IS NOT CONTROLLED WITH YOUR NAUSEA MEDICATION  *UNUSUAL SHORTNESS OF BREATH  *UNUSUAL BRUISING OR BLEEDING  TENDERNESS IN MOUTH AND THROAT WITH OR WITHOUT PRESENCE OF ULCERS  *URINARY PROBLEMS  *BOWEL PROBLEMS  UNUSUAL RASH Items with * indicate a potential emergency and should be followed up as soon as possible.  Feel free to call the clinic you have any questions or concerns. The clinic phone number is [phone].  Please show the [place] at check-in to the Emergency Department and triage nurse.   Nanoparticle Albumin-Bound Paclitaxel injection ABRAXANE What is this medicine? NANOPARTICLE ALBUMIN-BOUND PACLITAXEL (Na no PAHR ti kuhl al BYOO muhn-bound PAK li TAX el) is a chemotherapy drug. It targets fast dividing cells, like cancer cells, and causes these cells to die. This medicine is used to treat advanced breast cancer and advanced lung cancer. This medicine may be used for other purposes; ask your health care provider or pharmacist if you have questions. What should I tell my health care provider before I take this medicine? They need to know if you have any of these conditions: -kidney disease -liver disease -low blood counts, like low platelets, red blood cells, or white blood cells -recent or ongoing radiation therapy -an unusual or allergic reaction to paclitaxel, albumin, other chemotherapy, other medicines, foods, dyes, or  preservatives -pregnant or trying to get pregnant -breast-feeding How should I use this medicine? This drug is given as an infusion into a vein. It is administered in a hospital or clinic by a specially trained health care professional. Talk to your pediatrician regarding the use of this medicine in children. Special care may be needed. Overdosage: If you think you have taken too much of this medicine contact a poison control center or emergency room at once. NOTE: This medicine is only for you. Do not share this medicine with others. What if I miss a dose? It is important not to miss your dose. Call your doctor or health care professional if you are unable to keep an appointment. What may interact with this medicine? -cyclosporine -diazepam -ketoconazole -medicines to increase blood counts like filgrastim, pegfilgrastim, sargramostim -other chemotherapy drugs like cisplatin, doxorubicin, epirubicin, etoposide, teniposide, vincristine -quinidine -testosterone -vaccines -verapamil Talk to your doctor or health care professional before taking any of these medicines: -acetaminophen -aspirin -ibuprofen -ketoprofen -naproxen This list may not describe all possible interactions. Give your health care provider a list of all the medicines, herbs, non-prescription drugs, or dietary supplements you use. Also tell them if you smoke, drink alcohol, or use illegal drugs. Some items may interact with your medicine. What should I watch for while using this medicine? Your condition will be monitored carefully while you are receiving this medicine. You will need important blood work done while you are taking this medicine. This drug may make you feel generally unwell. This is not uncommon, as chemotherapy can affect healthy cells as well as cancer cells. Report any side effects. Continue your course of treatment even though  you feel ill unless your doctor tells you to stop. In some cases, you may be  given additional medicines to help with side effects. Follow all directions for their use. Call your doctor or health care professional for advice if you get a fever, chills or sore throat, or other symptoms of a cold or flu. Do not treat yourself. This drug decreases your body's ability to fight infections. Try to avoid being around people who are sick. This medicine may increase your risk to bruise or bleed. Call your doctor or health care professional if you notice any unusual bleeding. Be careful brushing and flossing your teeth or using a toothpick because you may get an infection or bleed more easily. If you have any dental work done, tell your dentist you are receiving this medicine. Avoid taking products that contain aspirin, acetaminophen, ibuprofen, naproxen, or ketoprofen unless instructed by your doctor. These medicines may hide a fever. Do not become pregnant while taking this medicine. Women should inform their doctor if they wish to become pregnant or think they might be pregnant. There is a potential for serious side effects to an unborn child. Talk to your health care professional or pharmacist for more information. Do not breast-feed an infant while taking this medicine. Men are advised not to father a child while receiving this medicine. What side effects may I notice from receiving this medicine? Side effects that you should report to your doctor or health care professional as soon as possible: -allergic reactions like skin rash, itching or hives, swelling of the face, lips, or tongue -low blood counts - This drug may decrease the number of white blood cells, red blood cells and platelets. You may be at increased risk for infections and bleeding. -signs of infection - fever or chills, cough, sore throat, pain or difficulty passing urine -signs of decreased platelets or bleeding - bruising, pinpoint red spots on the skin, black, tarry stools, nosebleeds -signs of decreased red blood  cells - unusually weak or tired, fainting spells, lightheadedness -breathing problems -changes in vision -chest pain -high or low blood pressure -mouth sores -nausea and vomiting -pain, swelling, redness or irritation at the injection site -pain, tingling, numbness in the hands or feet -slow or irregular heartbeat -swelling of the ankle, feet, hands Side effects that usually do not require medical attention (report to your doctor or health care professional if they continue or are bothersome): -aches, pains -changes in the color of fingernails -diarrhea -hair loss -loss of appetite This list may not describe all possible side effects. Call your doctor for medical advice about side effects. You may report side effects to FDA at 1-800-FDA-1088. Where should I keep my medicine? This drug is given in a hospital or clinic and will not be stored at home. NOTE: This sheet is a summary. It may not cover all possible information. If you have questions about this medicine, talk to your doctor, pharmacist, or health care provider.    2016, Elsevier/Gold Standard. (2013-01-15 16:48:50)

## 2016-05-17 NOTE — Patient Instructions (Signed)
Follow up and echo with Dr.Bensimhon in 6 months  

## 2016-05-17 NOTE — Progress Notes (Signed)
*  PRELIMINARY RESULTS* Echocardiogram 2D Echocardiogram has been performed.  Leavy Cella 05/17/2016, 12:11 PM

## 2016-05-17 NOTE — Progress Notes (Signed)
Patient ID: Kiara Spencer, female   DOB: Mar 14, 1971, 45 y.o.   MRN: [id number]   CARDIO-ONCOLOGY CLINIC CONSULT NOTE  Referring Physician: Dr Lindi Adie Primary Care: Primary Cardiologist: None Radiologist Oncologist: Dr Lisbeth Renshaw Plastic Surgeon: Dr Marla Roe   HPI: Kiara Spencer is a 45 year old with a history right breast cancer Stage 2b (T2, N1, MO) 11/2016 , S/P bilateral mastectomies with reconstruction 02/05/2016 referred to [facility] by Dr Lindi Adie.   SUMMARY OF ONCOLOGIC HISTORY:   Breast cancer of upper-outer quadrant of right female breast ([place])   11/24/2015 Mammogram 2 suspicious groups of calcifications right breast UOQ posteriorly 2.2 x 2.3 x 2.5 cm (by U/S measured 1.2 x 0.8 x 1.2 cm); anteriorly 1.1 x 1.2 x 0.8 cm (not seen by ultrasound)   11/27/2015 Initial Diagnosis Right breast biopsy UOQ Posterior: IDC with DCIS with, LVI present, ER 100%, PR 20%, Ki-67 30%, HER-2 negative; ANTERIOR: Complex sclerosing lesions with calcifications   02/05/2016 Surgery Rt. Mastectomy: IDC Grade 1, 3 cm, 1/11 LN positive,with IG DCIS 0.1 cm margin, Diffuse LVI, cancer at axill tail. T2N1 (Stage 2B); Left mastectomy: Fibrocystic ch, Mammaprint high risk           She has finished adriamycin now getting abraxane only. Has expanders in. No cardiac complaints. Overall doing well.   ECHO 03/08/2016: EF 55%. Tissue doppler and strain no performed. ECHO 05/17/2016: EF 55-60% (poor images) Not strain performed.  Lateral s' 10.8     Current Outpatient Prescriptions  Medication Sig Dispense Refill  . Ascorbic Acid (VITAMIN C) 100 MG tablet Take 100 mg by mouth daily.    [name] dexamethasone (DECADRON) 2 MG tablet Take 2 mg by mouth See admin instructions. Take for 2 days after chemo    . diazepam (VALIUM) 2 MG tablet Take 1 tablet (2 mg total) by mouth every 12 (twelve) hours. 30 tablet 0  . ibuprofen (ADVIL,MOTRIN) 200 MG tablet Take 200 mg by mouth every 6 (six) hours as needed for fever  or moderate pain. Reported on 03/29/2016    . nab-PACLitaxel CELGENE ABI-007-NSCL-003 (ABRAXANE) 100 MG Inject 100 mg/m2 into the vein.    [name] ondansetron (ZOFRAN-ODT) 4 MG disintegrating tablet Take 1 tablet (4 mg total) by mouth every 6 (six) hours as needed for nausea. 20 tablet 0  . oxyCODONE-acetaminophen (PERCOCET/ROXICET) 5-325 MG tablet Take 1 tablet by mouth every 4 (four) hours as needed for severe pain.    . polyethylene glycol (MIRALAX / GLYCOLAX) packet Take 17 g by mouth daily as needed for mild constipation. 14 each 0  . prochlorperazine (COMPAZINE) 10 MG tablet Take 10 mg by mouth every 6 (six) hours as needed for nausea or vomiting.     No current facility-administered medications for this encounter.    Allergies  Allergen Reactions  . Cephalexin Rash      Social History   Social History  . Marital Status: Married    Spouse Name: N/A  . Number of Children: N/A  . Years of Education: N/A   Occupational History  . Not on file.   Social History Main Topics  . Smoking status: Never Smoker   . Smokeless tobacco: Never Used  . Alcohol Use: No  . Drug Use: No  . Sexual Activity: Yes    Birth Control/ Protection: None   Other Topics Concern  . Not on file   Social History Narrative      Family History  Problem Relation Age of Onset  . Breast cancer  Mother 16    s/p partial mastectomy  . Colon polyps Mother     3 polyps total  . Breast cancer Maternal Aunt 64  . Leukemia Maternal Uncle 33  . Heart attack Paternal Aunt   . Heart attack Maternal Grandmother   . Heart attack Maternal Grandfather   . Lupus Maternal Aunt   . Prostate cancer Maternal Uncle 68  . Prostate cancer Maternal Uncle 72  . Prostate cancer Maternal Uncle   . Heart attack Maternal Uncle   . Prostate cancer Maternal Uncle     dx. 52s   . Breast cancer Cousin     dx. early 69s or younger; may have had genetic testing  . Cancer Cousin     "rare" cancer, dx. 37s  . Pancreatic cancer  Cousin     dx. 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 Throat cancer Paternal Uncle     dx. early 60s  . Cancer Other     unspecified type    Filed Vitals:   05/17/16 1211  BP: 101/69  Pulse: 80  Resp: 18  Weight: 149 lb 8 oz (67.813 kg)  SpO2: 100%    PHYSICAL EXAM: General:  Well appearing. No respiratory difficulty HEENT: normal Neck: supple. no JVD. Carotids 2+ bilat; no bruits. No lymphadenopathy or thryomegaly appreciated. Cor: s/p bilateral mastectomies with expanders in place. PMI nondisplaced. Regular rate & rhythm. No rubs, gallops or murmurs. R port in place Lungs: clear Abdomen: soft, nontender, nondistended. No hepatosplenomegaly. No bruits or masses. Good bowel sounds. Extremities: no cyanosis, clubbing, rash, edema Neuro: alert & oriented x 3, cranial nerves grossly intact. moves all 4 extremities w/o difficulty. Affect pleasant.   ASSESSMENT & PLAN:  1. R breast Cancer-Stage IIB T2, N1, M0 . ER+ PR - HER-2 -  She has completed adriamycin therapy. I reviewed echos personally. EF and Doppler parameters stable. Will see her back in January for 6 month f/u echo to ensure stability.   Glori Bickers MD 12:34 PM

## 2016-05-17 NOTE — Addendum Note (Signed)
Encounter addended by: Harvie Junior, CMA on: 05/17/2016 12:43 PM<BR>     Documentation filed: Patient Instructions Section

## 2016-05-24 ENCOUNTER — Telehealth: Payer: Self-pay | Admitting: Hematology and Oncology

## 2016-05-24 ENCOUNTER — Ambulatory Visit (HOSPITAL_BASED_OUTPATIENT_CLINIC_OR_DEPARTMENT_OTHER): Payer: BLUE CROSS/BLUE SHIELD

## 2016-05-24 ENCOUNTER — Other Ambulatory Visit (HOSPITAL_BASED_OUTPATIENT_CLINIC_OR_DEPARTMENT_OTHER): Payer: BLUE CROSS/BLUE SHIELD

## 2016-05-24 ENCOUNTER — Ambulatory Visit (HOSPITAL_BASED_OUTPATIENT_CLINIC_OR_DEPARTMENT_OTHER): Payer: BLUE CROSS/BLUE SHIELD | Admitting: Hematology and Oncology

## 2016-05-24 ENCOUNTER — Encounter: Payer: Self-pay | Admitting: *Deleted

## 2016-05-24 ENCOUNTER — Encounter: Payer: Self-pay | Admitting: Hematology and Oncology

## 2016-05-24 VITALS — BP 97/66 | HR 81 | Temp 99.3°F | Resp 17 | Wt 147.5 lb

## 2016-05-24 DIAGNOSIS — C50411 Malignant neoplasm of upper-outer quadrant of right female breast: Secondary | ICD-10-CM

## 2016-05-24 DIAGNOSIS — Z5111 Encounter for antineoplastic chemotherapy: Secondary | ICD-10-CM | POA: Diagnosis not present

## 2016-05-24 DIAGNOSIS — L658 Other specified nonscarring hair loss: Secondary | ICD-10-CM

## 2016-05-24 DIAGNOSIS — G620 Drug-induced polyneuropathy: Secondary | ICD-10-CM | POA: Diagnosis not present

## 2016-05-24 DIAGNOSIS — R530 Neoplastic (malignant) related fatigue: Secondary | ICD-10-CM

## 2016-05-24 LAB — CBC WITH DIFFERENTIAL/PLATELET
BASO%: 1.3 % (ref 0.0–2.0)
BASOS ABS: 0.1 10*3/uL (ref 0.0–0.1)
EOS%: 3.1 % (ref 0.0–7.0)
Eosinophils Absolute: 0.2 10*3/uL (ref 0.0–0.5)
HEMATOCRIT: 28 % — AB (ref 34.8–46.6)
HGB: 9.3 g/dL — ABNORMAL LOW (ref 11.6–15.9)
LYMPH#: 0.6 10*3/uL — AB (ref 0.9–3.3)
LYMPH%: 12.2 % — ABNORMAL LOW (ref 14.0–49.7)
MCH: 29.6 pg (ref 25.1–34.0)
MCHC: 33.2 g/dL (ref 31.5–36.0)
MCV: 89.1 fL (ref 79.5–101.0)
MONO#: 0.7 10*3/uL (ref 0.1–0.9)
MONO%: 14.4 % — ABNORMAL HIGH (ref 0.0–14.0)
NEUT#: 3.4 10*3/uL (ref 1.5–6.5)
NEUT%: 69 % (ref 38.4–76.8)
PLATELETS: 321 10*3/uL (ref 145–400)
RBC: 3.14 10*6/uL — ABNORMAL LOW (ref 3.70–5.45)
RDW: 17.6 % — ABNORMAL HIGH (ref 11.2–14.5)
WBC: 4.9 10*3/uL (ref 3.9–10.3)

## 2016-05-24 LAB — COMPREHENSIVE METABOLIC PANEL
ALBUMIN: 3.6 g/dL (ref 3.5–5.0)
ALT: 17 U/L (ref 0–55)
ANION GAP: 7 meq/L (ref 3–11)
AST: 15 U/L (ref 5–34)
Alkaline Phosphatase: 53 U/L (ref 40–150)
BUN: 9.3 mg/dL (ref 7.0–26.0)
CALCIUM: 9.4 mg/dL (ref 8.4–10.4)
CHLORIDE: 108 meq/L (ref 98–109)
CO2: 25 mEq/L (ref 22–29)
CREATININE: 0.7 mg/dL (ref 0.6–1.1)
EGFR: >90 mL/min/{1.73_m2} (ref >90)
Glucose: 81 mg/dl (ref 70–140)
Potassium: 4.3 mEq/L (ref 3.5–5.1)
Sodium: 140 mEq/L (ref 136–145)
Total Bilirubin: 0.4 mg/dL (ref 0.20–1.20)
Total Protein: 6.8 g/dL (ref 6.4–8.3)

## 2016-05-24 MED ORDER — PROCHLORPERAZINE MALEATE 10 MG PO TABS
10.0000 mg | ORAL_TABLET | Freq: Once | ORAL | Status: AC
  Administered 2016-05-24: 10 mg via ORAL

## 2016-05-24 MED ORDER — SODIUM CHLORIDE 0.9% FLUSH
10.0000 mL | INTRAVENOUS | Status: DC | PRN
  Administered 2016-05-24: 10 mL
  Filled 2016-05-24: qty 10

## 2016-05-24 MED ORDER — PROCHLORPERAZINE MALEATE 10 MG PO TABS
ORAL_TABLET | ORAL | Status: AC
  Filled 2016-05-24: qty 1

## 2016-05-24 MED ORDER — HEPARIN SOD (PORK) LOCK FLUSH 100 UNIT/ML IV SOLN
500.0000 [IU] | Freq: Once | INTRAVENOUS | Status: AC | PRN
  Administered 2016-05-24: 500 [IU]
  Filled 2016-05-24: qty 5

## 2016-05-24 MED ORDER — PACLITAXEL PROTEIN-BOUND CHEMO INJECTION 100 MG
125.0000 mg | Freq: Once | INTRAVENOUS | Status: AC
  Administered 2016-05-24: 125 mg via INTRAVENOUS
  Filled 2016-05-24: qty 25

## 2016-05-24 MED ORDER — SODIUM CHLORIDE 0.9 % IV SOLN
Freq: Once | INTRAVENOUS | Status: AC
  Administered 2016-05-24: 11:00:00 via INTRAVENOUS

## 2016-05-24 NOTE — Telephone Encounter (Signed)
appt made and avs printed °

## 2016-05-24 NOTE — Progress Notes (Signed)
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 [facility])   Staging form: Breast, AJCC 7th Edition     Clinical stage from 12/10/2015: Stage IIA (T2, N0, M0) - Unsigned       Staging comments: Staged at breast conference on 1.4.17  SUMMARY OF ONCOLOGIC HISTORY:   Breast cancer of upper-outer quadrant of right female breast ([place])   11/24/2015 Mammogram 2 suspicious groups of calcifications right breast UOQ posteriorly 2.2 x 2.3 x 2.5 cm (by U/S measured 1.2 x 0.8 x 1.2 cm); anteriorly 1.1 x 1.2 x 0.8 cm (not seen by ultrasound)   11/27/2015 Initial Diagnosis Right breast biopsy UOQ Posterior: IDC with DCIS with, LVI present, ER 100%, PR 20%, Ki-67 30%, HER-2 negative; ANTERIOR: Complex sclerosing lesions with calcifications   02/05/2016 Surgery Rt. Mastectomy: IDC Grade 1, 3 cm, 1/11 LN positive,with IG DCIS 0.1 cm margin, Diffuse LVI, cancer at axill tail. T2N1 (Stage 2B); Left mastectomy: Fibrocystic ch, Mammaprint high risk     03/15/2016 -  Chemotherapy Dose dense Adriamycin and Cytoxan 4 followed by Abraxane weekly 12   CHIEF COMPLIANT: Abraxane cycle 3  INTERVAL HISTORY: Kiara Spencer is a 45 year old above-mentioned history of right breast cancer currently on adjuvant therapy with Abraxane. Today is cycle 3. She has noticed slight numbness at the tips of the fingers. It has not affected her. She denies any pain. Denies any nausea vomiting. Continues to have mild fatigue. Denies any nausea vomiting.  REVIEW OF SYSTEMS:   Constitutional: Denies fevers, chills or abnormal weight loss Eyes: Denies blurriness of vision Ears,  nose, mouth, throat, and face: Denies mucositis or sore throat Respiratory: Denies cough, dyspnea or wheezes Cardiovascular: Denies palpitation, chest discomfort Gastrointestinal:  Denies nausea, heartburn or change in bowel habits Skin: Denies abnormal skin rashes Lymphatics: Denies new lymphadenopathy or easy bruising Neurological: Mild tingling and numbness of the fingers tips Behavioral/Psych: Mood is stable, no new changes  Extremities: No lower extremity edema All other systems were reviewed with the patient and are negative.  I have reviewed the past medical history, past surgical history, social history and family history with the patient and they are unchanged from previous note.  ALLERGIES:  is allergic to cephalexin.  MEDICATIONS:  Current Outpatient Prescriptions  Medication Sig Dispense Refill  . Ascorbic Acid (VITAMIN C) 100 MG tablet Take 100 mg by mouth daily.    [name] dexamethasone (DECADRON) 2 MG tablet Take 2 mg by mouth See admin instructions. Take for 2 days after chemo    . diazepam (VALIUM) 2 MG tablet Take 1 tablet (2 mg total) by mouth every 12 (twelve) hours. 30 tablet 0  . ibuprofen (ADVIL,MOTRIN) 200 MG tablet Take 200 mg by mouth every 6 (six) hours as needed for fever or moderate pain. Reported on 03/29/2016    . nab-PACLitaxel CELGENE ABI-007-NSCL-003 (ABRAXANE) 100 MG Inject 100 mg/m2 into the vein.    [name] ondansetron (ZOFRAN-ODT) 4 MG disintegrating tablet Take 1 tablet (4 mg total) by mouth every 6 (six) hours as needed for nausea. 20 tablet 0  . oxyCODONE-acetaminophen (PERCOCET/ROXICET) 5-325 MG tablet Take 1 tablet by mouth every 4 (four) hours as needed for severe pain.    [name]  polyethylene glycol (MIRALAX / GLYCOLAX) packet Take 17 g by mouth daily as needed for mild constipation. 14 each 0  . prochlorperazine (COMPAZINE) 10 MG tablet Take 10 mg by mouth every 6 (six) hours as needed for nausea or vomiting.     No current facility-administered medications for  this visit.    PHYSICAL EXAMINATION: ECOG PERFORMANCE STATUS: 1 - Symptomatic but completely ambulatory  Filed Vitals:   05/24/16 0950  BP: 97/66  Pulse: 81  Temp: 99.3 F (37.4 C)  Resp: 17   Filed Weights   05/24/16 0950  Weight: 147 lb 8 oz (66.906 kg)    GENERAL:alert, no distress and comfortable SKIN: skin color, texture, turgor are normal, no rashes or significant lesions EYES: normal, Conjunctiva are pink and non-injected, sclera clear OROPHARYNX:no exudate, no erythema and lips, buccal mucosa, and tongue normal  NECK: supple, thyroid normal size, non-tender, without nodularity LYMPH:  no palpable lymphadenopathy in the cervical, axillary or inguinal LUNGS: clear to auscultation and percussion with normal breathing effort HEART: regular rate & rhythm and no murmurs and no lower extremity edema ABDOMEN:abdomen soft, non-tender and normal bowel sounds MUSCULOSKELETAL:no cyanosis of digits and no clubbing  NEURO: alert & oriented x 3 with fluent speech, no focal motor/sensory deficits EXTREMITIES: No lower extremity edema  LABORATORY DATA:  I have reviewed the data as listed   Chemistry      Component Value Date/Time   NA 140 05/24/2016 0938   NA 140 03/02/2016 1045   K 4.3 05/24/2016 0938   K 3.9 03/02/2016 1045   CL 102 03/02/2016 1045   CO2 25 05/24/2016 0938   CO2 26 02/07/2016 0551   BUN 9.3 05/24/2016 0938   BUN 15 03/02/2016 1045   CREATININE 0.7 05/24/2016 0938   CREATININE 0.70 03/02/2016 1045      Component Value Date/Time   CALCIUM 9.4 05/24/2016 0938   CALCIUM 8.7* 02/07/2016 0551   ALKPHOS 53 05/24/2016 0938   ALKPHOS 57 01/28/2016 0850   AST 15 05/24/2016 0938   AST 21 01/28/2016 0850   ALT 17 05/24/2016 0938   ALT 14 01/28/2016 0850   BILITOT 0.40 05/24/2016 0938   BILITOT 0.2* 01/28/2016 0850       Lab Results  Component Value Date   WBC 4.9 05/24/2016   HGB 9.3* 05/24/2016   HCT 28.0* 05/24/2016   MCV 89.1 05/24/2016   PLT 321  05/24/2016   NEUTROABS 3.4 05/24/2016     ASSESSMENT & PLAN:  Breast cancer of upper-outer quadrant of right female breast ([place]) Rt. 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 T2N1 (Stage 2B); Left mastectomy: Fibrocystic ch. Mammaprint high risk  Tumor board recommendation: 1. No indication for repeat resection since there is nothing further to move in the posterior side 2. Mammaprint: High risk, adjuvant chemotherapy with dose dense Adriamycin Cytoxan 4 followed by weekly Abraxane 12 3. Followed by radiation therapy (she may choose to undergo radiation in [place].) 4. Followed by antiestrogen therapy.   ------------------------------------------------------------------------------------------------------- Current Treatment: Completed 4 cycles of AC, today is cycle 3/12 Abraxane  Chemotherapy Side Effects: 1. Bone pain from neulasta use: claritin daily for 3-4 days 2. Mild hypersensitivity reaction during cycle 1: resolved with benadryl and pepcid. No adjustments to future chemo made. 3. Nausea grade 1: Zofran helps 4. Fatigue due to chemotherapy 5. Alopecia 6. Neuropathy grade 1: Monitoring closely  Monitoring closely for toxicities Labs  were reviewed in detail. Return to clinic in 2 weeks for cycle 5/12 Abraxane treatment.   No orders of the defined types were placed in this encounter.   The patient has a good understanding of the overall plan. she agrees with it. she will call with any problems that may develop before the next visit here.   Rulon Eisenmenger, MD 05/24/2016

## 2016-05-24 NOTE — Patient Instructions (Signed)
[facility] Discharge Instructions for Patients Receiving Chemotherapy  Today you received the following chemotherapy agents Abraxane To help prevent nausea and vomiting after your treatment, we encourage you to take your nausea medication as prescribed.   If you develop nausea and vomiting that is not controlled by your nausea medication, call the clinic.   BELOW ARE SYMPTOMS THAT SHOULD BE REPORTED IMMEDIATELY:  *FEVER GREATER THAN 100.5 F  *CHILLS WITH OR WITHOUT FEVER  NAUSEA AND VOMITING THAT IS NOT CONTROLLED WITH YOUR NAUSEA MEDICATION  *UNUSUAL SHORTNESS OF BREATH  *UNUSUAL BRUISING OR BLEEDING  TENDERNESS IN MOUTH AND THROAT WITH OR WITHOUT PRESENCE OF ULCERS  *URINARY PROBLEMS  *BOWEL PROBLEMS  UNUSUAL RASH Items with * indicate a potential emergency and should be followed up as soon as possible.  Feel free to call the clinic you have any questions or concerns. The clinic phone number is [phone].  Please show the CHEMO ALERT CARD at check-in to the Emergency Department and triage nurse.   

## 2016-05-24 NOTE — Assessment & Plan Note (Signed)
Rt. 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 T2N1 (Stage 2B); Left mastectomy: Fibrocystic ch. Mammaprint high risk  Tumor board recommendation: 1. No indication for repeat resection since there is nothing further to move in the posterior side 2. Mammaprint: High risk, adjuvant chemotherapy with dose dense Adriamycin Cytoxan 4 followed by weekly Abraxane 12 3. Followed by radiation therapy (she may choose to undergo radiation in [place].) 4. Followed by antiestrogen therapy.   ------------------------------------------------------------------------------------------------------- Current Treatment: Completed 4 cycles of AC, today is cycle 3/12 Abraxane  Chemotherapy Side Effects: 1. Bone pain from neulasta use: claritin daily for 3-4 days 2. Mild hypersensitivity reaction during cycle 1: resolved with benadryl and pepcid. No adjustments to future chemo made. 3. Nausea grade 1: Zofran helps 4. Fatigue due to chemotherapy 5. Alopecia  Monitoring closely for toxicities Labs were reviewed in detail. Return to clinic in 2 weeks for cycle 5/12 Abraxane treatment.

## 2016-05-31 ENCOUNTER — Other Ambulatory Visit: Payer: Self-pay | Admitting: *Deleted

## 2016-05-31 ENCOUNTER — Ambulatory Visit (HOSPITAL_BASED_OUTPATIENT_CLINIC_OR_DEPARTMENT_OTHER): Payer: BLUE CROSS/BLUE SHIELD

## 2016-05-31 ENCOUNTER — Telehealth: Payer: Self-pay | Admitting: Hematology and Oncology

## 2016-05-31 ENCOUNTER — Other Ambulatory Visit (HOSPITAL_BASED_OUTPATIENT_CLINIC_OR_DEPARTMENT_OTHER): Payer: BLUE CROSS/BLUE SHIELD

## 2016-05-31 ENCOUNTER — Encounter: Payer: Self-pay | Admitting: *Deleted

## 2016-05-31 VITALS — BP 112/71 | HR 84 | Temp 98.8°F | Resp 16

## 2016-05-31 DIAGNOSIS — C50411 Malignant neoplasm of upper-outer quadrant of right female breast: Secondary | ICD-10-CM

## 2016-05-31 DIAGNOSIS — Z5111 Encounter for antineoplastic chemotherapy: Secondary | ICD-10-CM | POA: Diagnosis not present

## 2016-05-31 LAB — CBC WITH DIFFERENTIAL/PLATELET
BASO%: 1.7 % (ref 0.0–2.0)
Basophils Absolute: 0.1 10*3/uL (ref 0.0–0.1)
EOS%: 6.5 % (ref 0.0–7.0)
Eosinophils Absolute: 0.3 10*3/uL (ref 0.0–0.5)
HCT: 28.4 % — ABNORMAL LOW (ref 34.8–46.6)
HEMOGLOBIN: 9.2 g/dL — AB (ref 11.6–15.9)
LYMPH#: 0.7 10*3/uL — AB (ref 0.9–3.3)
LYMPH%: 15.7 % (ref 14.0–49.7)
MCH: 29.6 pg (ref 25.1–34.0)
MCHC: 32.5 g/dL (ref 31.5–36.0)
MCV: 91 fL (ref 79.5–101.0)
MONO#: 0.5 10*3/uL (ref 0.1–0.9)
MONO%: 11.6 % (ref 0.0–14.0)
NEUT#: 2.9 10*3/uL (ref 1.5–6.5)
NEUT%: 64.5 % (ref 38.4–76.8)
Platelets: 358 10*3/uL (ref 145–400)
RBC: 3.12 10*6/uL — ABNORMAL LOW (ref 3.70–5.45)
RDW: 17.9 % — AB (ref 11.2–14.5)
WBC: 4.5 10*3/uL (ref 3.9–10.3)

## 2016-05-31 LAB — COMPREHENSIVE METABOLIC PANEL
ALBUMIN: 3.6 g/dL (ref 3.5–5.0)
ALT: 18 U/L (ref 0–55)
AST: 20 U/L (ref 5–34)
Alkaline Phosphatase: 53 U/L (ref 40–150)
Anion Gap: 8 mEq/L (ref 3–11)
BUN: 11.5 mg/dL (ref 7.0–26.0)
CHLORIDE: 108 meq/L (ref 98–109)
CO2: 25 meq/L (ref 22–29)
Calcium: 9.3 mg/dL (ref 8.4–10.4)
Creatinine: 0.7 mg/dL (ref 0.6–1.1)
EGFR: >90 mL/min/{1.73_m2} (ref >90)
GLUCOSE: 81 mg/dL (ref 70–140)
POTASSIUM: 4.2 meq/L (ref 3.5–5.1)
SODIUM: 141 meq/L (ref 136–145)
Total Bilirubin: 0.31 mg/dL (ref 0.20–1.20)
Total Protein: 6.7 g/dL (ref 6.4–8.3)

## 2016-05-31 MED ORDER — HEPARIN SOD (PORK) LOCK FLUSH 100 UNIT/ML IV SOLN
500.0000 [IU] | Freq: Once | INTRAVENOUS | Status: AC | PRN
  Administered 2016-05-31: 500 [IU]
  Filled 2016-05-31: qty 5

## 2016-05-31 MED ORDER — SODIUM CHLORIDE 0.9% FLUSH
10.0000 mL | INTRAVENOUS | Status: DC | PRN
  Administered 2016-05-31: 10 mL
  Filled 2016-05-31: qty 10

## 2016-05-31 MED ORDER — PROCHLORPERAZINE MALEATE 10 MG PO TABS
ORAL_TABLET | ORAL | Status: AC
  Filled 2016-05-31: qty 1

## 2016-05-31 MED ORDER — PACLITAXEL PROTEIN-BOUND CHEMO INJECTION 100 MG
80.0000 mg/m2 | Freq: Once | INTRAVENOUS | Status: AC
  Administered 2016-05-31: 125 mg via INTRAVENOUS
  Filled 2016-05-31: qty 25

## 2016-05-31 MED ORDER — SODIUM CHLORIDE 0.9 % IV SOLN
Freq: Once | INTRAVENOUS | Status: AC
  Administered 2016-05-31: 10:00:00 via INTRAVENOUS

## 2016-05-31 MED ORDER — PROCHLORPERAZINE MALEATE 10 MG PO TABS
10.0000 mg | ORAL_TABLET | Freq: Once | ORAL | Status: AC
  Administered 2016-05-31: 10 mg via ORAL

## 2016-05-31 NOTE — Patient Instructions (Signed)

## 2016-05-31 NOTE — Telephone Encounter (Signed)
appt made and avs printed °

## 2016-06-07 ENCOUNTER — Ambulatory Visit (HOSPITAL_BASED_OUTPATIENT_CLINIC_OR_DEPARTMENT_OTHER): Payer: BLUE CROSS/BLUE SHIELD

## 2016-06-07 ENCOUNTER — Other Ambulatory Visit (HOSPITAL_BASED_OUTPATIENT_CLINIC_OR_DEPARTMENT_OTHER): Payer: BLUE CROSS/BLUE SHIELD

## 2016-06-07 VITALS — BP 97/61 | HR 86 | Temp 98.6°F | Resp 18

## 2016-06-07 DIAGNOSIS — Z5111 Encounter for antineoplastic chemotherapy: Secondary | ICD-10-CM

## 2016-06-07 DIAGNOSIS — C50411 Malignant neoplasm of upper-outer quadrant of right female breast: Secondary | ICD-10-CM | POA: Diagnosis not present

## 2016-06-07 LAB — COMPREHENSIVE METABOLIC PANEL
ALBUMIN: 3.8 g/dL (ref 3.5–5.0)
ALK PHOS: 68 U/L (ref 40–150)
ALT: 24 U/L (ref 0–55)
AST: 23 U/L (ref 5–34)
Anion Gap: 10 mEq/L (ref 3–11)
BILIRUBIN TOTAL: 0.32 mg/dL (ref 0.20–1.20)
BUN: 10.9 mg/dL (ref 7.0–26.0)
CO2: 26 meq/L (ref 22–29)
Calcium: 9.5 mg/dL (ref 8.4–10.4)
Chloride: 104 mEq/L (ref 98–109)
Creatinine: 0.8 mg/dL (ref 0.6–1.1)
EGFR: >90 mL/min/{1.73_m2} (ref >90)
GLUCOSE: 98 mg/dL (ref 70–140)
Potassium: 4.3 mEq/L (ref 3.5–5.1)
SODIUM: 140 meq/L (ref 136–145)
TOTAL PROTEIN: 6.9 g/dL (ref 6.4–8.3)

## 2016-06-07 LAB — CBC WITH DIFFERENTIAL/PLATELET
BASO%: 1 % (ref 0.0–2.0)
Basophils Absolute: 0 10*3/uL (ref 0.0–0.1)
EOS ABS: 0.2 10*3/uL (ref 0.0–0.5)
EOS%: 4.5 % (ref 0.0–7.0)
HCT: 31.1 % — ABNORMAL LOW (ref 34.8–46.6)
HEMOGLOBIN: 10 g/dL — AB (ref 11.6–15.9)
LYMPH#: 0.8 10*3/uL — AB (ref 0.9–3.3)
LYMPH%: 19.6 % (ref 14.0–49.7)
MCH: 30 pg (ref 25.1–34.0)
MCHC: 32.2 g/dL (ref 31.5–36.0)
MCV: 93.4 fL (ref 79.5–101.0)
MONO#: 0.3 10*3/uL (ref 0.1–0.9)
MONO%: 6.5 % (ref 0.0–14.0)
NEUT%: 68.4 % (ref 38.4–76.8)
NEUTROS ABS: 2.7 10*3/uL (ref 1.5–6.5)
Platelets: 269 10*3/uL (ref 145–400)
RBC: 3.33 10*6/uL — ABNORMAL LOW (ref 3.70–5.45)
RDW: 16.7 % — ABNORMAL HIGH (ref 11.2–14.5)
WBC: 4 10*3/uL (ref 3.9–10.3)

## 2016-06-07 MED ORDER — PROCHLORPERAZINE MALEATE 10 MG PO TABS
10.0000 mg | ORAL_TABLET | Freq: Once | ORAL | Status: AC
  Administered 2016-06-07: 10 mg via ORAL

## 2016-06-07 MED ORDER — PROCHLORPERAZINE MALEATE 10 MG PO TABS
ORAL_TABLET | ORAL | Status: AC
  Filled 2016-06-07: qty 1

## 2016-06-07 MED ORDER — SODIUM CHLORIDE 0.9 % IV SOLN
Freq: Once | INTRAVENOUS | Status: AC
  Administered 2016-06-07: 10:00:00 via INTRAVENOUS

## 2016-06-07 MED ORDER — HEPARIN SOD (PORK) LOCK FLUSH 100 UNIT/ML IV SOLN
500.0000 [IU] | Freq: Once | INTRAVENOUS | Status: AC | PRN
  Administered 2016-06-07: 500 [IU]
  Filled 2016-06-07: qty 5

## 2016-06-07 MED ORDER — SODIUM CHLORIDE 0.9% FLUSH
10.0000 mL | INTRAVENOUS | Status: DC | PRN
  Administered 2016-06-07: 10 mL
  Filled 2016-06-07: qty 10

## 2016-06-07 MED ORDER — PACLITAXEL PROTEIN-BOUND CHEMO INJECTION 100 MG
80.0000 mg/m2 | Freq: Once | INTRAVENOUS | Status: AC
  Administered 2016-06-07: 125 mg via INTRAVENOUS
  Filled 2016-06-07: qty 25

## 2016-06-07 NOTE — Patient Instructions (Signed)
[facility] Discharge Instructions for Patients Receiving Chemotherapy  Today you received the following chemotherapy agents Abraxane To help prevent nausea and vomiting after your treatment, we encourage you to take your nausea medication as prescribed.   If you develop nausea and vomiting that is not controlled by your nausea medication, call the clinic.   BELOW ARE SYMPTOMS THAT SHOULD BE REPORTED IMMEDIATELY:  *FEVER GREATER THAN 100.5 F  *CHILLS WITH OR WITHOUT FEVER  NAUSEA AND VOMITING THAT IS NOT CONTROLLED WITH YOUR NAUSEA MEDICATION  *UNUSUAL SHORTNESS OF BREATH  *UNUSUAL BRUISING OR BLEEDING  TENDERNESS IN MOUTH AND THROAT WITH OR WITHOUT PRESENCE OF ULCERS  *URINARY PROBLEMS  *BOWEL PROBLEMS  UNUSUAL RASH Items with * indicate a potential emergency and should be followed up as soon as possible.  Feel free to call the clinic you have any questions or concerns. The clinic phone number is [phone].  Please show the CHEMO ALERT CARD at check-in to the Emergency Department and triage nurse.   

## 2016-06-14 ENCOUNTER — Ambulatory Visit (HOSPITAL_BASED_OUTPATIENT_CLINIC_OR_DEPARTMENT_OTHER): Payer: BLUE CROSS/BLUE SHIELD | Admitting: Hematology and Oncology

## 2016-06-14 ENCOUNTER — Ambulatory Visit: Payer: BLUE CROSS/BLUE SHIELD

## 2016-06-14 ENCOUNTER — Encounter: Payer: Self-pay | Admitting: Hematology and Oncology

## 2016-06-14 ENCOUNTER — Other Ambulatory Visit (HOSPITAL_BASED_OUTPATIENT_CLINIC_OR_DEPARTMENT_OTHER): Payer: BLUE CROSS/BLUE SHIELD

## 2016-06-14 ENCOUNTER — Ambulatory Visit (HOSPITAL_BASED_OUTPATIENT_CLINIC_OR_DEPARTMENT_OTHER): Payer: BLUE CROSS/BLUE SHIELD

## 2016-06-14 VITALS — BP 104/68 | HR 80 | Temp 98.3°F | Resp 18 | Ht 61.0 in | Wt 150.7 lb

## 2016-06-14 DIAGNOSIS — D6481 Anemia due to antineoplastic chemotherapy: Secondary | ICD-10-CM | POA: Diagnosis not present

## 2016-06-14 DIAGNOSIS — C50411 Malignant neoplasm of upper-outer quadrant of right female breast: Secondary | ICD-10-CM

## 2016-06-14 DIAGNOSIS — L609 Nail disorder, unspecified: Secondary | ICD-10-CM

## 2016-06-14 DIAGNOSIS — G602 Neuropathy in association with hereditary ataxia: Secondary | ICD-10-CM

## 2016-06-14 DIAGNOSIS — L658 Other specified nonscarring hair loss: Secondary | ICD-10-CM

## 2016-06-14 DIAGNOSIS — R110 Nausea: Secondary | ICD-10-CM

## 2016-06-14 DIAGNOSIS — Z5111 Encounter for antineoplastic chemotherapy: Secondary | ICD-10-CM

## 2016-06-14 DIAGNOSIS — M898X9 Other specified disorders of bone, unspecified site: Secondary | ICD-10-CM

## 2016-06-14 DIAGNOSIS — R531 Weakness: Secondary | ICD-10-CM

## 2016-06-14 LAB — CBC WITH DIFFERENTIAL/PLATELET
BASO%: 0.8 % (ref 0.0–2.0)
BASOS ABS: 0 10*3/uL (ref 0.0–0.1)
EOS ABS: 0.1 10*3/uL (ref 0.0–0.5)
EOS%: 3.7 % (ref 0.0–7.0)
HCT: 29.1 % — ABNORMAL LOW (ref 34.8–46.6)
HEMOGLOBIN: 9.5 g/dL — AB (ref 11.6–15.9)
LYMPH%: 20.3 % (ref 14.0–49.7)
MCH: 30.6 pg (ref 25.1–34.0)
MCHC: 32.6 g/dL (ref 31.5–36.0)
MCV: 93.8 fL (ref 79.5–101.0)
MONO#: 0.4 10*3/uL (ref 0.1–0.9)
MONO%: 9.9 % (ref 0.0–14.0)
NEUT#: 2.4 10*3/uL (ref 1.5–6.5)
NEUT%: 65.3 % (ref 38.4–76.8)
Platelets: 252 10*3/uL (ref 145–400)
RBC: 3.1 10*6/uL — ABNORMAL LOW (ref 3.70–5.45)
RDW: 17.8 % — AB (ref 11.2–14.5)
WBC: 3.6 10*3/uL — AB (ref 3.9–10.3)
lymph#: 0.7 10*3/uL — ABNORMAL LOW (ref 0.9–3.3)

## 2016-06-14 LAB — COMPREHENSIVE METABOLIC PANEL
ALBUMIN: 3.7 g/dL (ref 3.5–5.0)
ALK PHOS: 60 U/L (ref 40–150)
ALT: 24 U/L (ref 0–55)
ANION GAP: 9 meq/L (ref 3–11)
AST: 23 U/L (ref 5–34)
BUN: 12.4 mg/dL (ref 7.0–26.0)
CALCIUM: 9.7 mg/dL (ref 8.4–10.4)
CHLORIDE: 108 meq/L (ref 98–109)
CO2: 25 mEq/L (ref 22–29)
Creatinine: 0.7 mg/dL (ref 0.6–1.1)
Glucose: 62 mg/dl — ABNORMAL LOW (ref 70–140)
POTASSIUM: 4.8 meq/L (ref 3.5–5.1)
Sodium: 142 mEq/L (ref 136–145)
Total Bilirubin: 0.38 mg/dL (ref 0.20–1.20)
Total Protein: 6.7 g/dL (ref 6.4–8.3)

## 2016-06-14 MED ORDER — PROCHLORPERAZINE MALEATE 10 MG PO TABS
10.0000 mg | ORAL_TABLET | Freq: Once | ORAL | Status: AC
  Administered 2016-06-14: 10 mg via ORAL

## 2016-06-14 MED ORDER — PROCHLORPERAZINE MALEATE 10 MG PO TABS
ORAL_TABLET | ORAL | Status: AC
  Filled 2016-06-14: qty 1

## 2016-06-14 MED ORDER — SODIUM CHLORIDE 0.9% FLUSH
10.0000 mL | INTRAVENOUS | Status: DC | PRN
  Administered 2016-06-14: 10 mL
  Filled 2016-06-14: qty 10

## 2016-06-14 MED ORDER — PACLITAXEL PROTEIN-BOUND CHEMO INJECTION 100 MG
80.0000 mg/m2 | Freq: Once | INTRAVENOUS | Status: AC
  Administered 2016-06-14: 125 mg via INTRAVENOUS
  Filled 2016-06-14: qty 25

## 2016-06-14 MED ORDER — SODIUM CHLORIDE 0.9 % IV SOLN
Freq: Once | INTRAVENOUS | Status: AC
  Administered 2016-06-14: 11:00:00 via INTRAVENOUS

## 2016-06-14 MED ORDER — HEPARIN SOD (PORK) LOCK FLUSH 100 UNIT/ML IV SOLN
500.0000 [IU] | Freq: Once | INTRAVENOUS | Status: AC | PRN
Start: 2016-06-14 — End: 2016-06-14
  Administered 2016-06-14: 500 [IU]
  Filled 2016-06-14: qty 5

## 2016-06-14 NOTE — Progress Notes (Signed)
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 [facility])   Staging form: Breast, AJCC 7th Edition     Clinical stage from 12/10/2015: Stage IIA (T2, N0, M0) - Unsigned       Staging comments: Staged at breast conference on 1.4.17  SUMMARY OF ONCOLOGIC HISTORY:   Breast cancer of upper-outer quadrant of right female breast ([place])   11/24/2015 Mammogram 2 suspicious groups of calcifications right breast UOQ posteriorly 2.2 x 2.3 x 2.5 cm (by U/S measured 1.2 x 0.8 x 1.2 cm); anteriorly 1.1 x 1.2 x 0.8 cm (not seen by ultrasound)   11/27/2015 Initial Diagnosis Right breast biopsy UOQ Posterior: IDC with DCIS with, LVI present, ER 100%, PR 20%, Ki-67 30%, HER-2 negative; ANTERIOR: Complex sclerosing lesions with calcifications   02/05/2016 Surgery Rt. Mastectomy: IDC Grade 1, 3 cm, 1/11 LN positive,with IG DCIS 0.1 cm margin, Diffuse LVI, cancer at axill tail. T2N1 (Stage 2B); Left mastectomy: Fibrocystic ch, Mammaprint high risk     03/15/2016 -  Chemotherapy Dose dense Adriamycin and Cytoxan 4 followed by Abraxane weekly 12    CHIEF COMPLIANT: Abraxane week 5  INTERVAL HISTORY: Kiara Spencer is a 45 year old with above-mentioned history of right breast cancer currently on adjuvant chemotherapy and today is cycle 5 Abraxane. She appears to be tolerating Abraxane fairly well. Her major complaints are related to nails lifting off the nailbed. Tingling and numbness of the tips of the toes is unchanged.  REVIEW OF SYSTEMS:   Constitutional: Denies fevers, chills or abnormal weight loss Eyes: Denies blurriness of vision Ears, nose,  mouth, throat, and face: Denies mucositis or sore throat Respiratory: Denies cough, dyspnea or wheezes Cardiovascular: Denies palpitation, chest discomfort Gastrointestinal:  Denies nausea, heartburn or change in bowel habits Skin: Denies abnormal skin rashes Lymphatics: Denies new lymphadenopathy or easy bruising Neurological:Denies numbness, tingling or new weaknesses Behavioral/Psych: Mood is stable, no new changes  Extremities: No lower extremity edema Breast:  denies any pain or lumps or nodules in either breasts All other systems were reviewed with the patient and are negative.  I have reviewed the past medical history, past surgical history, social history and family history with the patient and they are unchanged from previous note.  ALLERGIES:  is allergic to cephalexin.  MEDICATIONS:  Current Outpatient Prescriptions  Medication Sig Dispense Refill  . Ascorbic Acid (VITAMIN C) 100 MG tablet Take 100 mg by mouth daily.    [name] dexamethasone (DECADRON) 2 MG tablet Take 2 mg by mouth See admin instructions. Take for 2 days after chemo    . diazepam (VALIUM) 2 MG tablet Take 1 tablet (2 mg total) by mouth every 12 (twelve) hours. 30 tablet 0  . ibuprofen (ADVIL,MOTRIN) 200 MG tablet Take 200 mg by mouth every 6 (six) hours as needed for fever or moderate pain. Reported on 03/29/2016    . nab-PACLitaxel CELGENE ABI-007-NSCL-003 (ABRAXANE) 100 MG Inject 100 mg/m2 into the vein.    [name] ondansetron (ZOFRAN-ODT) 4 MG disintegrating tablet Take 1 tablet (4 mg total) by mouth every 6 (six) hours as needed for nausea. 20 tablet 0  . oxyCODONE-acetaminophen (PERCOCET/ROXICET) 5-325 MG tablet Take 1 tablet by mouth every 4 (four) hours  as needed for severe pain.    . polyethylene glycol (MIRALAX / GLYCOLAX) packet Take 17 g by mouth daily as needed for mild constipation. 14 each 0  . prochlorperazine (COMPAZINE) 10 MG tablet Take 10 mg by mouth every 6 (six) hours as needed for nausea or vomiting.      No current facility-administered medications for this visit.    PHYSICAL EXAMINATION: ECOG PERFORMANCE STATUS: 1 - Symptomatic but completely ambulatory  Filed Vitals:   06/14/16 0938  BP: 104/68  Pulse: 80  Temp: 98.3 F (36.8 C)  Resp: 18   Filed Weights   06/14/16 0938  Weight: 150 lb 11.2 oz (68.357 kg)    GENERAL:alert, no distress and comfortable SKIN: skin color, texture, turgor are normal, no rashes or significant lesions EYES: normal, Conjunctiva are pink and non-injected, sclera clear OROPHARYNX:no exudate, no erythema and lips, buccal mucosa, and tongue normal  NECK: supple, thyroid normal size, non-tender, without nodularity LYMPH:  no palpable lymphadenopathy in the cervical, axillary or inguinal LUNGS: clear to auscultation and percussion with normal breathing effort HEART: regular rate & rhythm and no murmurs and no lower extremity edema ABDOMEN:abdomen soft, non-tender and normal bowel sounds MUSCULOSKELETAL:no cyanosis of digits and no clubbing  NEURO: alert & oriented x 3 with fluent speech, no focal motor/sensory deficits EXTREMITIES: No lower extremity edema  LABORATORY DATA:  I have reviewed the data as listed   Chemistry      Component Value Date/Time   NA 140 06/07/2016 0916   NA 140 03/02/2016 1045   K 4.3 06/07/2016 0916   K 3.9 03/02/2016 1045   CL 102 03/02/2016 1045   CO2 26 06/07/2016 0916   CO2 26 02/07/2016 0551   BUN 10.9 06/07/2016 0916   BUN 15 03/02/2016 1045   CREATININE 0.8 06/07/2016 0916   CREATININE 0.70 03/02/2016 1045      Component Value Date/Time   CALCIUM 9.5 06/07/2016 0916   CALCIUM 8.7* 02/07/2016 0551   ALKPHOS 68 06/07/2016 0916   ALKPHOS 57 01/28/2016 0850   AST 23 06/07/2016 0916   AST 21 01/28/2016 0850   ALT 24 06/07/2016 0916   ALT 14 01/28/2016 0850   BILITOT 0.32 06/07/2016 0916   BILITOT 0.2* 01/28/2016 0850       Lab Results  Component Value Date   WBC 3.6* 06/14/2016   HGB 9.5*  06/14/2016   HCT 29.1* 06/14/2016   MCV 93.8 06/14/2016   PLT 252 06/14/2016   NEUTROABS 2.4 06/14/2016     ASSESSMENT & PLAN:  Breast cancer of upper-outer quadrant of right female breast ([place]) Rt. 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 T2N1 (Stage 2B); Left mastectomy: Fibrocystic ch. Mammaprint high risk  Tumor board recommendation: 1. No indication for repeat resection since there is nothing further to move in the posterior side 2. Mammaprint: High risk, adjuvant chemotherapy with dose dense Adriamycin Cytoxan 4 followed by weekly Abraxane 12 3. Followed by radiation therapy (she may choose to undergo radiation in [place] 4. Followed by antiestrogen therapy.   ------------------------------------------------------------------------------------------------------- Current Treatment: Completed 4 cycles of AC, today is cycle 5/12 Abraxane  Chemotherapy Side Effects: 1. Bone pain from neulasta use: claritin daily for 3-4 days 2. Mild hypersensitivity reaction during cycle 1: resolved with benadryl and pepcid. No adjustments to future chemo made. 3. Nausea grade 1: Zofran helps 4. Fatigue due to chemotherapy 5. Alopecia 6. Neuropathy grade  1: Monitoring closely 7. Nails: Fingernails appeared to be detaching from the nailbed. 8. Anemia due to chemotherapy grade 2: I anticipate the hemoglobin to improve slowly with time.  Monitoring closely for toxicities Labs were reviewed in detail. Return to clinic in 2 weeks for cycle 7/12 Abraxane treatment.   No orders of the defined types were placed in this encounter.   The patient has a good understanding of the overall plan. she agrees with it. she will call with any problems that may develop before the next visit here.   Rulon Eisenmenger, MD 06/14/2016

## 2016-06-14 NOTE — Assessment & Plan Note (Signed)
Rt. 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 T2N1 (Stage 2B); Left mastectomy: Fibrocystic ch. Mammaprint high risk  Tumor board recommendation: 1. No indication for repeat resection since there is nothing further to move in the posterior side 2. Mammaprint: High risk, adjuvant chemotherapy with dose dense Adriamycin Cytoxan 4 followed by weekly Abraxane 12 3. Followed by radiation therapy (she may choose to undergo radiation in [place].) 4. Followed by antiestrogen therapy.   ------------------------------------------------------------------------------------------------------- Current Treatment: Completed 4 cycles of AC, today is cycle 5/12 Abraxane  Chemotherapy Side Effects: 1. Bone pain from neulasta use: claritin daily for 3-4 days 2. Mild hypersensitivity reaction during cycle 1: resolved with benadryl and pepcid. No adjustments to future chemo made. 3. Nausea grade 1: Zofran helps 4. Fatigue due to chemotherapy 5. Alopecia 6. Neuropathy grade 1: Monitoring closely  Monitoring closely for toxicities Labs were reviewed in detail. Return to clinic in 2 weeks for cycle 7/12 Abraxane treatment.

## 2016-06-21 ENCOUNTER — Other Ambulatory Visit: Payer: Self-pay | Admitting: Hematology and Oncology

## 2016-06-21 ENCOUNTER — Other Ambulatory Visit (HOSPITAL_BASED_OUTPATIENT_CLINIC_OR_DEPARTMENT_OTHER): Payer: BLUE CROSS/BLUE SHIELD

## 2016-06-21 ENCOUNTER — Ambulatory Visit (HOSPITAL_BASED_OUTPATIENT_CLINIC_OR_DEPARTMENT_OTHER): Payer: BLUE CROSS/BLUE SHIELD

## 2016-06-21 ENCOUNTER — Other Ambulatory Visit: Payer: Self-pay | Admitting: *Deleted

## 2016-06-21 ENCOUNTER — Encounter: Payer: Self-pay | Admitting: *Deleted

## 2016-06-21 VITALS — BP 101/64 | HR 72 | Temp 98.3°F | Resp 16

## 2016-06-21 DIAGNOSIS — C50411 Malignant neoplasm of upper-outer quadrant of right female breast: Secondary | ICD-10-CM

## 2016-06-21 DIAGNOSIS — Z5111 Encounter for antineoplastic chemotherapy: Secondary | ICD-10-CM | POA: Diagnosis not present

## 2016-06-21 LAB — COMPREHENSIVE METABOLIC PANEL
ALT: 40 U/L (ref 0–55)
AST: 29 U/L (ref 5–34)
Albumin: 3.8 g/dL (ref 3.5–5.0)
Alkaline Phosphatase: 60 U/L (ref 40–150)
Anion Gap: 9 mEq/L (ref 3–11)
BUN: 9.6 mg/dL (ref 7.0–26.0)
CALCIUM: 9.7 mg/dL (ref 8.4–10.4)
CHLORIDE: 108 meq/L (ref 98–109)
CO2: 26 mEq/L (ref 22–29)
Creatinine: 0.7 mg/dL (ref 0.6–1.1)
EGFR: >90 mL/min/{1.73_m2} (ref >90)
GLUCOSE: 106 mg/dL (ref 70–140)
POTASSIUM: 4.3 meq/L (ref 3.5–5.1)
SODIUM: 143 meq/L (ref 136–145)
Total Bilirubin: 0.41 mg/dL (ref 0.20–1.20)
Total Protein: 6.9 g/dL (ref 6.4–8.3)

## 2016-06-21 LAB — CBC WITH DIFFERENTIAL/PLATELET
BASO%: 1.1 % (ref 0.0–2.0)
BASOS ABS: 0 10*3/uL (ref 0.0–0.1)
EOS%: 2.6 % (ref 0.0–7.0)
Eosinophils Absolute: 0.1 10*3/uL (ref 0.0–0.5)
HCT: 31.2 % — ABNORMAL LOW (ref 34.8–46.6)
HGB: 10.3 g/dL — ABNORMAL LOW (ref 11.6–15.9)
LYMPH%: 23.3 % (ref 14.0–49.7)
MCH: 31 pg (ref 25.1–34.0)
MCHC: 33.1 g/dL (ref 31.5–36.0)
MCV: 93.8 fL (ref 79.5–101.0)
MONO#: 0.2 10*3/uL (ref 0.1–0.9)
MONO%: 7 % (ref 0.0–14.0)
NEUT#: 1.8 10*3/uL (ref 1.5–6.5)
NEUT%: 66 % (ref 38.4–76.8)
Platelets: 299 10*3/uL (ref 145–400)
RBC: 3.33 10*6/uL — ABNORMAL LOW (ref 3.70–5.45)
RDW: 16.7 % — ABNORMAL HIGH (ref 11.2–14.5)
WBC: 2.8 10*3/uL — ABNORMAL LOW (ref 3.9–10.3)
lymph#: 0.6 10*3/uL — ABNORMAL LOW (ref 0.9–3.3)

## 2016-06-21 MED ORDER — GABAPENTIN 300 MG PO CAPS: 300.0000 mg | ORAL_CAPSULE | Freq: Every day | ORAL | Status: DC

## 2016-06-21 MED ORDER — PROCHLORPERAZINE MALEATE 10 MG PO TABS
ORAL_TABLET | ORAL | Status: AC
  Filled 2016-06-21: qty 1

## 2016-06-21 MED ORDER — PACLITAXEL PROTEIN-BOUND CHEMO INJECTION 100 MG
80.0000 mg/m2 | Freq: Once | INTRAVENOUS | Status: AC
  Administered 2016-06-21: 125 mg via INTRAVENOUS
  Filled 2016-06-21: qty 25

## 2016-06-21 MED ORDER — PROCHLORPERAZINE MALEATE 10 MG PO TABS
10.0000 mg | ORAL_TABLET | Freq: Once | ORAL | Status: AC
  Administered 2016-06-21: 10 mg via ORAL

## 2016-06-21 MED ORDER — SODIUM CHLORIDE 0.9 % IV SOLN
Freq: Once | INTRAVENOUS | Status: AC
  Administered 2016-06-21: 09:00:00 via INTRAVENOUS

## 2016-06-21 MED ORDER — HEPARIN SOD (PORK) LOCK FLUSH 100 UNIT/ML IV SOLN
500.0000 [IU] | Freq: Once | INTRAVENOUS | Status: AC | PRN
  Administered 2016-06-21: 500 [IU]
  Filled 2016-06-21: qty 5

## 2016-06-21 MED ORDER — SODIUM CHLORIDE 0.9% FLUSH
10.0000 mL | INTRAVENOUS | Status: DC | PRN
Start: 2016-06-21 — End: 2016-06-21
  Administered 2016-06-21: 10 mL
  Filled 2016-06-21: qty 10

## 2016-06-21 NOTE — Patient Instructions (Signed)
[facility] Discharge Instructions for Patients Receiving Chemotherapy  Today you received the following chemotherapy agents: Abraxane.  To help prevent nausea and vomiting after your treatment, we encourage you to take your nausea medication:  Compazine 10 mg every 6 hours as needed.   If you develop nausea and vomiting that is not controlled by your nausea medication, call the clinic.   BELOW ARE SYMPTOMS THAT SHOULD BE REPORTED IMMEDIATELY:  *FEVER GREATER THAN 100.5 F  *CHILLS WITH OR WITHOUT FEVER  NAUSEA AND VOMITING THAT IS NOT CONTROLLED WITH YOUR NAUSEA MEDICATION  *UNUSUAL SHORTNESS OF BREATH  *UNUSUAL BRUISING OR BLEEDING  TENDERNESS IN MOUTH AND THROAT WITH OR WITHOUT PRESENCE OF ULCERS  *URINARY PROBLEMS  *BOWEL PROBLEMS  UNUSUAL RASH Items with * indicate a potential emergency and should be followed up as soon as possible.  Feel free to call the clinic you have any questions or concerns. The clinic phone number is [phone].  Please show the CHEMO ALERT CARD at check-in to the Emergency Department and triage nurse.   

## 2016-06-21 NOTE — Telephone Encounter (Signed)
Patient states she is starting to have some tingling/pain in her big toes.   Per Dr. Lindi Adie can call in  neurontin qhs.

## 2016-06-28 ENCOUNTER — Ambulatory Visit (HOSPITAL_BASED_OUTPATIENT_CLINIC_OR_DEPARTMENT_OTHER): Payer: BLUE CROSS/BLUE SHIELD

## 2016-06-28 ENCOUNTER — Ambulatory Visit (HOSPITAL_BASED_OUTPATIENT_CLINIC_OR_DEPARTMENT_OTHER): Payer: BLUE CROSS/BLUE SHIELD | Admitting: Hematology and Oncology

## 2016-06-28 ENCOUNTER — Other Ambulatory Visit (HOSPITAL_BASED_OUTPATIENT_CLINIC_OR_DEPARTMENT_OTHER): Payer: BLUE CROSS/BLUE SHIELD

## 2016-06-28 ENCOUNTER — Encounter: Payer: Self-pay | Admitting: Hematology and Oncology

## 2016-06-28 DIAGNOSIS — R110 Nausea: Secondary | ICD-10-CM

## 2016-06-28 DIAGNOSIS — C50411 Malignant neoplasm of upper-outer quadrant of right female breast: Secondary | ICD-10-CM | POA: Diagnosis not present

## 2016-06-28 DIAGNOSIS — D6481 Anemia due to antineoplastic chemotherapy: Secondary | ICD-10-CM

## 2016-06-28 DIAGNOSIS — G620 Drug-induced polyneuropathy: Secondary | ICD-10-CM | POA: Diagnosis not present

## 2016-06-28 DIAGNOSIS — L608 Other nail disorders: Secondary | ICD-10-CM

## 2016-06-28 DIAGNOSIS — Z5111 Encounter for antineoplastic chemotherapy: Secondary | ICD-10-CM

## 2016-06-28 DIAGNOSIS — R5383 Other fatigue: Secondary | ICD-10-CM

## 2016-06-28 LAB — COMPREHENSIVE METABOLIC PANEL
ALT: 34 U/L (ref 0–55)
AST: 23 U/L (ref 5–34)
Albumin: 3.8 g/dL (ref 3.5–5.0)
Alkaline Phosphatase: 69 U/L (ref 40–150)
Anion Gap: 8 mEq/L (ref 3–11)
BILIRUBIN TOTAL: 0.43 mg/dL (ref 0.20–1.20)
BUN: 10 mg/dL (ref 7.0–26.0)
CHLORIDE: 107 meq/L (ref 98–109)
CO2: 27 meq/L (ref 22–29)
Calcium: 9.5 mg/dL (ref 8.4–10.4)
Creatinine: 0.8 mg/dL (ref 0.6–1.1)
GLUCOSE: 86 mg/dL (ref 70–140)
Potassium: 4.4 mEq/L (ref 3.5–5.1)
SODIUM: 142 meq/L (ref 136–145)
TOTAL PROTEIN: 6.9 g/dL (ref 6.4–8.3)

## 2016-06-28 LAB — CBC WITH DIFFERENTIAL/PLATELET
BASO%: 1 % (ref 0.0–2.0)
Basophils Absolute: 0 10*3/uL (ref 0.0–0.1)
EOS%: 2 % (ref 0.0–7.0)
Eosinophils Absolute: 0.1 10*3/uL (ref 0.0–0.5)
HCT: 31.7 % — ABNORMAL LOW (ref 34.8–46.6)
HGB: 10.4 g/dL — ABNORMAL LOW (ref 11.6–15.9)
LYMPH%: 19.1 % (ref 14.0–49.7)
MCH: 31.4 pg (ref 25.1–34.0)
MCHC: 32.9 g/dL (ref 31.5–36.0)
MCV: 95.6 fL (ref 79.5–101.0)
MONO#: 0.3 10*3/uL (ref 0.1–0.9)
MONO%: 9.2 % (ref 0.0–14.0)
NEUT%: 68.7 % (ref 38.4–76.8)
NEUTROS ABS: 2.3 10*3/uL (ref 1.5–6.5)
Platelets: 276 10*3/uL (ref 145–400)
RBC: 3.32 10*6/uL — AB (ref 3.70–5.45)
RDW: 16 % — ABNORMAL HIGH (ref 11.2–14.5)
WBC: 3.3 10*3/uL — AB (ref 3.9–10.3)
lymph#: 0.6 10*3/uL — ABNORMAL LOW (ref 0.9–3.3)

## 2016-06-28 MED ORDER — PROCHLORPERAZINE MALEATE 10 MG PO TABS
10.0000 mg | ORAL_TABLET | Freq: Once | ORAL | Status: AC
  Administered 2016-06-28: 10 mg via ORAL

## 2016-06-28 MED ORDER — PACLITAXEL PROTEIN-BOUND CHEMO INJECTION 100 MG
80.0000 mg/m2 | Freq: Once | INTRAVENOUS | Status: AC
Start: 2016-06-28 — End: 2016-06-28
  Administered 2016-06-28: 125 mg via INTRAVENOUS
  Filled 2016-06-28: qty 25

## 2016-06-28 MED ORDER — SODIUM CHLORIDE 0.9% FLUSH
10.0000 mL | INTRAVENOUS | Status: DC | PRN
  Administered 2016-06-28: 10 mL
  Filled 2016-06-28: qty 10

## 2016-06-28 MED ORDER — GABAPENTIN 100 MG PO CAPS: 100.0000 mg | ORAL_CAPSULE | Freq: Every day | ORAL | 0 refills | Status: DC

## 2016-06-28 MED ORDER — HEPARIN SOD (PORK) LOCK FLUSH 100 UNIT/ML IV SOLN
500.0000 [IU] | Freq: Once | INTRAVENOUS | Status: AC | PRN
  Administered 2016-06-28: 500 [IU]
  Filled 2016-06-28: qty 5

## 2016-06-28 MED ORDER — SODIUM CHLORIDE 0.9 % IV SOLN
Freq: Once | INTRAVENOUS | Status: AC
  Administered 2016-06-28: 10:00:00 via INTRAVENOUS

## 2016-06-28 NOTE — Progress Notes (Addendum)
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 [facility])   Staging form: Breast, AJCC 7th Edition   - Clinical stage from 12/10/2015: Stage IIA (T2, N0, M0) - Unsigned         Staging comments: Staged at breast conference on 1.4.17  SUMMARY OF ONCOLOGIC HISTORY:   Breast cancer of upper-outer quadrant of right female breast ([place])   11/24/2015 Mammogram    2 suspicious groups of calcifications right breast UOQ posteriorly 2.2 x 2.3 x 2.5 cm (by U/S measured 1.2 x 0.8 x 1.2 cm); anteriorly 1.1 x 1.2 x 0.8 cm (not seen by ultrasound)     11/27/2015 Initial Diagnosis    Right breast biopsy UOQ Posterior: IDC with DCIS with, LVI present, ER 100%, PR 20%, Ki-67 30%, HER-2 negative; ANTERIOR: Complex sclerosing lesions with calcifications     02/05/2016 Surgery    Rt. Mastectomy: IDC Grade 1, 3 cm, 1/11 LN positive,with IG DCIS 0.1 cm margin, Diffuse LVI, cancer at axill tail. T2N1 (Stage 2B); Left mastectomy: Fibrocystic ch, Mammaprint high risk       03/15/2016 -  Chemotherapy    Dose dense Adriamycin and Cytoxan 4 followed by Abraxane weekly 12      CHIEF COMPLIANT: Abraxane cycle 7  INTERVAL HISTORY: Kiara Spencer is a 45 year old with above-mentioned history of right breast cancer currently on adjuvant chemotherapy today cycle 7 of Abraxane. She continues to have fatigue. She also complains of her nailbeds separating from the nails. She denies any nausea or vomiting. She does have tingling and numbness at the tips of the fingers and toes.  REVIEW OF SYSTEMS:   Constitutional: Denies fevers, chills or abnormal weight  loss Eyes: Denies blurriness of vision Ears, nose, mouth, throat, and face: Denies mucositis or sore throat Respiratory: Denies cough, dyspnea or wheezes Cardiovascular: Denies palpitation, chest discomfort Gastrointestinal:  Denies nausea, heartburn or change in bowel habits Skin: Denies abnormal skin rashes Lymphatics: Denies new lymphadenopathy or easy bruising Neurological: Neuropathy the tips of the fingers and toes Behavioral/Psych: Mood is stable, no new changes  Extremities: No lower extremity edema  All other systems were reviewed with the patient and are negative.  I have reviewed the past medical history, past surgical history, social history and family history with the patient and they are unchanged from previous note.  ALLERGIES:  is allergic to cephalexin.  MEDICATIONS:  Current Outpatient Prescriptions  Medication Sig Dispense Refill  . Ascorbic Acid (VITAMIN C) 100 MG tablet Take 100 mg by mouth daily.    . diazepam (VALIUM) 2 MG tablet Take 1 tablet (2 mg total) by mouth every 12 (twelve) hours. 30 tablet 0  . gabapentin (NEURONTIN) 300 MG capsule Take 1 capsule (300 mg total) by mouth at bedtime. 30 capsule 1  . ibuprofen (ADVIL,MOTRIN) 200 MG tablet Take 200 mg by mouth every 6 (six) hours as needed for fever or moderate pain. Reported on 03/29/2016    . nab-PACLitaxel CELGENE [id number] (ABRAXANE) 100 MG Inject 100 mg/m2 into the vein.    [name] ondansetron (ZOFRAN-ODT) 4 MG disintegrating tablet Take 1 tablet (4 mg total) by mouth every 6 (six) hours as needed for nausea. 20 tablet 0  .  oxyCODONE-acetaminophen (PERCOCET/ROXICET) 5-325 MG tablet Take 1 tablet by mouth every 4 (four) hours as needed for severe pain.    . polyethylene glycol (MIRALAX / GLYCOLAX) packet Take 17 g by mouth daily as needed for mild constipation. 14 each 0  . prochlorperazine (COMPAZINE) 10 MG tablet Take 10 mg by mouth every 6 (six) hours as needed for nausea or vomiting.     No current  facility-administered medications for this visit.     PHYSICAL EXAMINATION: ECOG PERFORMANCE STATUS: 1 - Symptomatic but completely ambulatory  Vitals:   06/28/16 0834  BP: (!) 95/59  Pulse: 86  Resp: 18  Temp: 98.6 F (37 C)   Filed Weights   06/28/16 0834  Weight: 151 lb (68.5 kg)    GENERAL:alert, no distress and comfortable SKIN: skin color, texture, turgor are normal, no rashes or significant lesions EYES: normal, Conjunctiva are pink and non-injected, sclera clear OROPHARYNX:no exudate, no erythema and lips, buccal mucosa, and tongue normal  NECK: supple, thyroid normal size, non-tender, without nodularity LYMPH:  no palpable lymphadenopathy in the cervical, axillary or inguinal LUNGS: clear to auscultation and percussion with normal breathing effort HEART: regular rate & rhythm and no murmurs and no lower extremity edema ABDOMEN:abdomen soft, non-tender and normal bowel sounds MUSCULOSKELETAL:no cyanosis of digits and no clubbing  NEURO: alert & oriented x 3 with fluent speech, no focal motor/sensory deficits EXTREMITIES: No lower extremity edema   LABORATORY DATA:  I have reviewed the data as listed   Chemistry      Component Value Date/Time   NA 143 06/21/2016 0829   K 4.3 06/21/2016 0829   CL 102 03/02/2016 1045   CO2 26 06/21/2016 0829   BUN 9.6 06/21/2016 0829   CREATININE 0.7 06/21/2016 0829      Component Value Date/Time   CALCIUM 9.7 06/21/2016 0829   ALKPHOS 60 06/21/2016 0829   AST 29 06/21/2016 0829   ALT 40 06/21/2016 0829   BILITOT 0.41 06/21/2016 0829       Lab Results  Component Value Date   WBC 3.3 (L) 06/28/2016   HGB 10.4 (L) 06/28/2016   HCT 31.7 (L) 06/28/2016   MCV 95.6 06/28/2016   PLT 276 06/28/2016   NEUTROABS 2.3 06/28/2016     ASSESSMENT & PLAN:  Breast cancer of upper-outer quadrant of right female breast ([place]) Rt. Mastectomy 02/05/16: IDC Grade 1, 3 cm, 1/11 LN positive,with IG DCIS 0.1 cm margin, Diffuse LVI, cancer  at axill tail. T2N1 (Stage 2B); Left mastectomy: Fibrocystic ch. Mammaprint high risk  Tumor board recommendation: 1. No indication for repeat resection since there is nothing further to move in the posterior side 2. Mammaprint: High risk, adjuvant chemotherapy with dose dense Adriamycin Cytoxan 4 followed by weekly Abraxane 12 3. Followed by radiation therapy (she may choose to undergo radiation in [place].) 4. Followed by antiestrogen therapy.   ------------------------------------------------------------------------------------------------------- Current Treatment: Completed 4 cycles of AC, today is cycle 7/12 Abraxane  Chemotherapy Side Effects: 1. Bone pain from neulasta use: claritin daily for 3-4 days 2. Mild hypersensitivity reaction during cycle 1: resolved with benadryl and pepcid. No adjustments to future chemo made. 3. Nausea grade 1: Zofran helps 4. Fatigue due to chemotherapy 5. Alopecia 6. Neuropathy grade 1: Monitoring closely . If it gets worse, we may have to reduce the dosage of her treatment. 7. Nails: Fingernails appeared to be detaching from the nailbed. 8. Anemia due to chemotherapy grade 1: I anticipate the hemoglobin to improve slowly with  time. Hemoglobin 10.4  Monitoring closely for toxicities Labs were reviewed in detail. Return to clinic in 2 weeks for cycle 9/12 Abraxane treatment.   No orders of the defined types were placed in this encounter.  The patient has a good understanding of the overall plan. she agrees with it. she will call with any problems that may develop before the next visit here.   Rulon Eisenmenger, MD 06/28/16   Addendum: Today is cycle 8 of Abraxane (not cycle 7) Patient will return back to see [place] in 2 weeks for cycle 10 of Abraxane

## 2016-06-28 NOTE — Patient Instructions (Signed)
[facility] Discharge Instructions for Patients Receiving Chemotherapy  Today you received the following chemotherapy agents: Abraxane   To help prevent nausea and vomiting after your treatment, we encourage you to take your nausea medication as directed.    If you develop nausea and vomiting that is not controlled by your nausea medication, call the clinic.   BELOW ARE SYMPTOMS THAT SHOULD BE REPORTED IMMEDIATELY:  *FEVER GREATER THAN 100.5 F  *CHILLS WITH OR WITHOUT FEVER  NAUSEA AND VOMITING THAT IS NOT CONTROLLED WITH YOUR NAUSEA MEDICATION  *UNUSUAL SHORTNESS OF BREATH  *UNUSUAL BRUISING OR BLEEDING  TENDERNESS IN MOUTH AND THROAT WITH OR WITHOUT PRESENCE OF ULCERS  *URINARY PROBLEMS  *BOWEL PROBLEMS  UNUSUAL RASH Items with * indicate a potential emergency and should be followed up as soon as possible.  Feel free to call the clinic you have any questions or concerns. The clinic phone number is [phone].  Please show the CHEMO ALERT CARD at check-in to the Emergency Department and triage nurse.   

## 2016-06-28 NOTE — Assessment & Plan Note (Signed)
Rt. 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 T2N1 (Stage 2B); Left mastectomy: Fibrocystic ch. Mammaprint high risk  Tumor board recommendation: 1. No indication for repeat resection since there is nothing further to move in the posterior side 2. Mammaprint: High risk, adjuvant chemotherapy with dose dense Adriamycin Cytoxan 4 followed by weekly Abraxane 12 3. Followed by radiation therapy (she may choose to undergo radiation in [place].) 4. Followed by antiestrogen therapy.   ------------------------------------------------------------------------------------------------------- Current Treatment: Completed 4 cycles of AC, today is cycle 7/12 Abraxane  Chemotherapy Side Effects: 1. Bone pain from neulasta use: claritin daily for 3-4 days 2. Mild hypersensitivity reaction during cycle 1: resolved with benadryl and pepcid. No adjustments to future chemo made. 3. Nausea grade 1: Zofran helps 4. Fatigue due to chemotherapy 5. Alopecia 6. Neuropathy grade 1: Monitoring closely 7. Nails: Fingernails appeared to be detaching from the nailbed. 8. Anemia due to chemotherapy grade 2: I anticipate the hemoglobin to improve slowly with time.  Monitoring closely for toxicities Labs were reviewed in detail. Return to clinic in 2 weeks for cycle 9/12 Abraxane treatment.

## 2016-07-05 ENCOUNTER — Other Ambulatory Visit (HOSPITAL_BASED_OUTPATIENT_CLINIC_OR_DEPARTMENT_OTHER): Payer: BLUE CROSS/BLUE SHIELD

## 2016-07-05 ENCOUNTER — Ambulatory Visit (HOSPITAL_BASED_OUTPATIENT_CLINIC_OR_DEPARTMENT_OTHER): Payer: BLUE CROSS/BLUE SHIELD

## 2016-07-05 VITALS — BP 88/53 | HR 79 | Temp 97.5°F | Resp 16

## 2016-07-05 DIAGNOSIS — Z5111 Encounter for antineoplastic chemotherapy: Secondary | ICD-10-CM | POA: Diagnosis not present

## 2016-07-05 DIAGNOSIS — C50411 Malignant neoplasm of upper-outer quadrant of right female breast: Secondary | ICD-10-CM

## 2016-07-05 LAB — COMPREHENSIVE METABOLIC PANEL
ALK PHOS: 62 U/L (ref 40–150)
ALT: 34 U/L (ref 0–55)
ANION GAP: 9 meq/L (ref 3–11)
AST: 26 U/L (ref 5–34)
Albumin: 3.8 g/dL (ref 3.5–5.0)
BILIRUBIN TOTAL: 0.4 mg/dL (ref 0.20–1.20)
BUN: 11.4 mg/dL (ref 7.0–26.0)
CALCIUM: 9.5 mg/dL (ref 8.4–10.4)
CO2: 26 mEq/L (ref 22–29)
CREATININE: 0.8 mg/dL (ref 0.6–1.1)
Chloride: 107 mEq/L (ref 98–109)
EGFR: >90 mL/min/{1.73_m2} (ref >90)
Glucose: 121 mg/dl (ref 70–140)
Potassium: 4.3 mEq/L (ref 3.5–5.1)
Sodium: 142 mEq/L (ref 136–145)
TOTAL PROTEIN: 6.7 g/dL (ref 6.4–8.3)

## 2016-07-05 LAB — CBC WITH DIFFERENTIAL/PLATELET
BASO%: 1 % (ref 0.0–2.0)
Basophils Absolute: 0 10*3/uL (ref 0.0–0.1)
EOS ABS: 0 10*3/uL (ref 0.0–0.5)
EOS%: 1.6 % (ref 0.0–7.0)
HEMATOCRIT: 31.5 % — AB (ref 34.8–46.6)
HGB: 10.3 g/dL — ABNORMAL LOW (ref 11.6–15.9)
LYMPH#: 0.6 10*3/uL — AB (ref 0.9–3.3)
LYMPH%: 22.1 % (ref 14.0–49.7)
MCH: 31.4 pg (ref 25.1–34.0)
MCHC: 32.6 g/dL (ref 31.5–36.0)
MCV: 96.3 fL (ref 79.5–101.0)
MONO#: 0.2 10*3/uL (ref 0.1–0.9)
MONO%: 7.8 % (ref 0.0–14.0)
NEUT%: 67.5 % (ref 38.4–76.8)
NEUTROS ABS: 1.9 10*3/uL (ref 1.5–6.5)
PLATELETS: 321 10*3/uL (ref 145–400)
RBC: 3.28 10*6/uL — ABNORMAL LOW (ref 3.70–5.45)
RDW: 15.3 % — ABNORMAL HIGH (ref 11.2–14.5)
WBC: 2.8 10*3/uL — AB (ref 3.9–10.3)

## 2016-07-05 MED ORDER — SODIUM CHLORIDE 0.9% FLUSH
10.0000 mL | INTRAVENOUS | Status: DC | PRN
  Filled 2016-07-05: qty 10

## 2016-07-05 MED ORDER — PROCHLORPERAZINE MALEATE 10 MG PO TABS
ORAL_TABLET | ORAL | Status: AC
  Filled 2016-07-05: qty 1

## 2016-07-05 MED ORDER — PACLITAXEL PROTEIN-BOUND CHEMO INJECTION 100 MG
80.0000 mg/m2 | Freq: Once | INTRAVENOUS | Status: AC
  Administered 2016-07-05: 125 mg via INTRAVENOUS
  Filled 2016-07-05: qty 25

## 2016-07-05 MED ORDER — HEPARIN SOD (PORK) LOCK FLUSH 100 UNIT/ML IV SOLN
500.0000 [IU] | Freq: Once | INTRAVENOUS | Status: AC | PRN
  Administered 2016-07-05: 500 [IU]
  Filled 2016-07-05: qty 5

## 2016-07-05 MED ORDER — PROCHLORPERAZINE MALEATE 10 MG PO TABS
10.0000 mg | ORAL_TABLET | Freq: Once | ORAL | Status: AC
  Administered 2016-07-05: 10 mg via ORAL

## 2016-07-05 MED ORDER — SODIUM CHLORIDE 0.9 % IV SOLN
Freq: Once | INTRAVENOUS | Status: AC
  Administered 2016-07-05: 09:00:00 via INTRAVENOUS

## 2016-07-05 NOTE — Patient Instructions (Signed)
[facility] Discharge Instructions for Patients Receiving Chemotherapy  Today you received the following chemotherapy agents: Abraxane.  To help prevent nausea and vomiting after your treatment, we encourage you to take your nausea medication as directed by MD.  If you develop nausea and vomiting that is not controlled by your nausea medication, call the clinic.   BELOW ARE SYMPTOMS THAT SHOULD BE REPORTED IMMEDIATELY:  *FEVER GREATER THAN 100.5 F  *CHILLS WITH OR WITHOUT FEVER  NAUSEA AND VOMITING THAT IS NOT CONTROLLED WITH YOUR NAUSEA MEDICATION  *UNUSUAL SHORTNESS OF BREATH  *UNUSUAL BRUISING OR BLEEDING  TENDERNESS IN MOUTH AND THROAT WITH OR WITHOUT PRESENCE OF ULCERS  *URINARY PROBLEMS  *BOWEL PROBLEMS  UNUSUAL RASH Items with * indicate a potential emergency and should be followed up as soon as possible.  Feel free to call the clinic you have any questions or concerns. The clinic phone number is [phone].  Please show the [place] at check-in to the Emergency Department and triage nurse.

## 2016-07-12 ENCOUNTER — Other Ambulatory Visit (HOSPITAL_BASED_OUTPATIENT_CLINIC_OR_DEPARTMENT_OTHER): Payer: BLUE CROSS/BLUE SHIELD

## 2016-07-12 ENCOUNTER — Ambulatory Visit (HOSPITAL_BASED_OUTPATIENT_CLINIC_OR_DEPARTMENT_OTHER): Payer: BLUE CROSS/BLUE SHIELD | Admitting: Hematology and Oncology

## 2016-07-12 ENCOUNTER — Ambulatory Visit (HOSPITAL_BASED_OUTPATIENT_CLINIC_OR_DEPARTMENT_OTHER): Payer: BLUE CROSS/BLUE SHIELD

## 2016-07-12 ENCOUNTER — Encounter: Payer: Self-pay | Admitting: Hematology and Oncology

## 2016-07-12 ENCOUNTER — Encounter: Payer: Self-pay | Admitting: *Deleted

## 2016-07-12 DIAGNOSIS — C50411 Malignant neoplasm of upper-outer quadrant of right female breast: Secondary | ICD-10-CM

## 2016-07-12 DIAGNOSIS — C773 Secondary and unspecified malignant neoplasm of axilla and upper limb lymph nodes: Secondary | ICD-10-CM

## 2016-07-12 DIAGNOSIS — D6481 Anemia due to antineoplastic chemotherapy: Secondary | ICD-10-CM | POA: Diagnosis not present

## 2016-07-12 DIAGNOSIS — R110 Nausea: Secondary | ICD-10-CM

## 2016-07-12 DIAGNOSIS — L658 Other specified nonscarring hair loss: Secondary | ICD-10-CM

## 2016-07-12 DIAGNOSIS — Z5111 Encounter for antineoplastic chemotherapy: Secondary | ICD-10-CM

## 2016-07-12 DIAGNOSIS — G620 Drug-induced polyneuropathy: Secondary | ICD-10-CM

## 2016-07-12 DIAGNOSIS — D701 Agranulocytosis secondary to cancer chemotherapy: Secondary | ICD-10-CM | POA: Diagnosis not present

## 2016-07-12 DIAGNOSIS — L603 Nail dystrophy: Secondary | ICD-10-CM

## 2016-07-12 DIAGNOSIS — M898X9 Other specified disorders of bone, unspecified site: Secondary | ICD-10-CM

## 2016-07-12 LAB — COMPREHENSIVE METABOLIC PANEL
ALT: 33 U/L (ref 0–55)
AST: 27 U/L (ref 5–34)
Albumin: 3.8 g/dL (ref 3.5–5.0)
Alkaline Phosphatase: 54 U/L (ref 40–150)
Anion Gap: 8 mEq/L (ref 3–11)
BUN: 9.6 mg/dL (ref 7.0–26.0)
CHLORIDE: 108 meq/L (ref 98–109)
CO2: 26 meq/L (ref 22–29)
CREATININE: 0.7 mg/dL (ref 0.6–1.1)
Calcium: 9.7 mg/dL (ref 8.4–10.4)
EGFR: >90 mL/min/{1.73_m2} (ref >90)
GLUCOSE: 109 mg/dL (ref 70–140)
Potassium: 4.6 mEq/L (ref 3.5–5.1)
Sodium: 142 mEq/L (ref 136–145)
Total Bilirubin: 0.37 mg/dL (ref 0.20–1.20)
Total Protein: 6.7 g/dL (ref 6.4–8.3)

## 2016-07-12 LAB — CBC WITH DIFFERENTIAL/PLATELET
BASO%: 1.4 % (ref 0.0–2.0)
Basophils Absolute: 0 10*3/uL (ref 0.0–0.1)
EOS%: 1.6 % (ref 0.0–7.0)
Eosinophils Absolute: 0 10*3/uL (ref 0.0–0.5)
HCT: 33 % — ABNORMAL LOW (ref 34.8–46.6)
HGB: 10.6 g/dL — ABNORMAL LOW (ref 11.6–15.9)
LYMPH#: 0.7 10*3/uL — AB (ref 0.9–3.3)
LYMPH%: 28.6 % (ref 14.0–49.7)
MCH: 31.1 pg (ref 25.1–34.0)
MCHC: 32.2 g/dL (ref 31.5–36.0)
MCV: 96.6 fL (ref 79.5–101.0)
MONO#: 0.2 10*3/uL (ref 0.1–0.9)
MONO%: 8.6 % (ref 0.0–14.0)
NEUT#: 1.4 10*3/uL — ABNORMAL LOW (ref 1.5–6.5)
NEUT%: 59.8 % (ref 38.4–76.8)
Platelets: 316 10*3/uL (ref 145–400)
RBC: 3.42 10*6/uL — AB (ref 3.70–5.45)
RDW: 14.9 % — ABNORMAL HIGH (ref 11.2–14.5)
WBC: 2.3 10*3/uL — AB (ref 3.9–10.3)

## 2016-07-12 MED ORDER — GABAPENTIN 300 MG PO CAPS: 300.0000 mg | ORAL_CAPSULE | Freq: Every day | ORAL | 3 refills | Status: DC

## 2016-07-12 MED ORDER — PROCHLORPERAZINE MALEATE 10 MG PO TABS
10.0000 mg | ORAL_TABLET | Freq: Once | ORAL | Status: AC
  Administered 2016-07-12: 10 mg via ORAL

## 2016-07-12 MED ORDER — PROCHLORPERAZINE MALEATE 10 MG PO TABS
ORAL_TABLET | ORAL | Status: AC
  Filled 2016-07-12: qty 1

## 2016-07-12 MED ORDER — HEPARIN SOD (PORK) LOCK FLUSH 100 UNIT/ML IV SOLN
500.0000 [IU] | Freq: Once | INTRAVENOUS | Status: AC | PRN
  Administered 2016-07-12: 500 [IU]
  Filled 2016-07-12: qty 5

## 2016-07-12 MED ORDER — SODIUM CHLORIDE 0.9 % IV SOLN
Freq: Once | INTRAVENOUS | Status: AC
  Administered 2016-07-12: 11:00:00 via INTRAVENOUS

## 2016-07-12 MED ORDER — SODIUM CHLORIDE 0.9% FLUSH
10.0000 mL | INTRAVENOUS | Status: DC | PRN
  Administered 2016-07-12: 10 mL
  Filled 2016-07-12: qty 10

## 2016-07-12 MED ORDER — PACLITAXEL PROTEIN-BOUND CHEMO INJECTION 100 MG
65.0000 mg/m2 | Freq: Once | INTRAVENOUS | Status: AC
  Administered 2016-07-12: 100 mg via INTRAVENOUS
  Filled 2016-07-12: qty 20

## 2016-07-12 NOTE — Patient Instructions (Signed)
[place] Discharge Instructions for Patients Receiving Chemotherapy  Today you received the following chemotherapy agents: Abraxane.  To help prevent nausea and vomiting after your treatment, we encourage you to take your nausea medication as directed by MD.  If you develop nausea and vomiting that is not controlled by your nausea medication, call the clinic.   BELOW ARE SYMPTOMS THAT SHOULD BE REPORTED IMMEDIATELY:  *FEVER GREATER THAN 100.5 F  *CHILLS WITH OR WITHOUT FEVER  NAUSEA AND VOMITING THAT IS NOT CONTROLLED WITH YOUR NAUSEA MEDICATION  *UNUSUAL SHORTNESS OF BREATH  *UNUSUAL BRUISING OR BLEEDING  TENDERNESS IN MOUTH AND THROAT WITH OR WITHOUT PRESENCE OF ULCERS  *URINARY PROBLEMS  *BOWEL PROBLEMS  UNUSUAL RASH Items with * indicate a potential emergency and should be followed up as soon as possible.  Feel free to call the clinic you have any questions or concerns. The clinic phone number is [phone].  Please show the [place] at check-in to the Emergency Department and triage nurse.

## 2016-07-12 NOTE — Assessment & Plan Note (Signed)
Rt. 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 T2N1 (Stage 2B); Left mastectomy: Fibrocystic ch. Mammaprint high risk  Tumor board recommendation: 1. No indication for repeat resection since there is nothing further to move in the posterior side 2. Mammaprint: High risk, adjuvant chemotherapy with dose dense Adriamycin Cytoxan 4 followed by weekly Abraxane 12 3. Followed by radiation therapy (she may choose to undergo radiation in [place].) 4. Followed by antiestrogen therapy.   ------------------------------------------------------------------------------------------------------- Current Treatment:Completed 4 cycles of AC, today is cycle 10/12 Abraxane  Chemotherapy Side Effects: 1. Bone pain from neulasta use: claritin daily for 3-4 days 2. Mild hypersensitivity reaction during cycle 1: resolved with benadryl and pepcid. No adjustments to future chemo made. 3. Nausea grade 1: Zofran helps 4. Fatigue due to chemotherapy 5. Alopecia 6. Neuropathy grade 1: Monitoring closely . If it gets worse, we may have to reduce the dosage of her treatment. 7. Nails: Fingernails appeared to be detaching from the nailbed. 8. Anemia due to chemotherapy grade 1: I anticipate the hemoglobin to improve slowly with time. Hemoglobin 10.4  Monitoring closely for toxicities Labs were reviewed in detail. Return to clinic in 2 weeks for cycle 12/12 Abraxane treatment.

## 2016-07-12 NOTE — Progress Notes (Signed)
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 [facility])   Staging form: Breast, AJCC 7th Edition   - Clinical stage from 12/10/2015: Stage IIA (T2, N0, M0) - Unsigned         Staging comments: Staged at breast conference on 1.4.17  SUMMARY OF ONCOLOGIC HISTORY:   Breast cancer of upper-outer quadrant of right female breast ([place])   11/24/2015 Mammogram    2 suspicious groups of calcifications right breast UOQ posteriorly 2.2 x 2.3 x 2.5 cm (by U/S measured 1.2 x 0.8 x 1.2 cm); anteriorly 1.1 x 1.2 x 0.8 cm (not seen by ultrasound)     11/27/2015 Initial Diagnosis    Right breast biopsy UOQ Posterior: IDC with DCIS with, LVI present, ER 100%, PR 20%, Ki-67 30%, HER-2 negative; ANTERIOR: Complex sclerosing lesions with calcifications     02/05/2016 Surgery    Rt. Mastectomy: IDC Grade 1, 3 cm, 1/11 LN positive,with IG DCIS 0.1 cm margin, Diffuse LVI, cancer at axill tail. T2N1 (Stage 2B); Left mastectomy: Fibrocystic ch, Mammaprint high risk       03/15/2016 -  Chemotherapy    Dose dense Adriamycin and Cytoxan 4 followed by Abraxane weekly 12      CHIEF COMPLIANT: cycle 10 Abraxane  INTERVAL HISTORY: Kiara Spencer is a 45 year old above-mentioned history of right breast cancer currently on adjuvant chemotherapy with Abraxane. Today is cycle 10. She has noticed slightly more numbness of the tips of the fingers and a few toes. She is having slight difficulty picking up small objects. There is no pain associated with this. Her energy levels are excellent. She denies any new problems or concerns. Her nails appear to be holding  still. She has noticed slight gray hair coming up on her scalp. Denies any nausea or vomiting. She is fine to go back to work after the treatment is complete.  REVIEW OF SYSTEMS:   Constitutional: Denies fevers, chills or abnormal weight loss Eyes: Denies blurriness of vision Ears, nose, mouth, throat, and face: Denies mucositis or sore throat Respiratory: Denies cough, dyspnea or wheezes Cardiovascular: Denies palpitation, chest discomfort Gastrointestinal:  Denies nausea, heartburn or change in bowel habits Skin: Denies abnormal skin rashes Lymphatics: Denies new lymphadenopathy or easy bruising Neurological:neuropathy and tips of the fingers and toes grade 1 Behavioral/Psych: Mood is stable, no new changes  Extremities: No lower extremity edema Breast:  denies any pain or lumps or nodules in either breasts All other systems were reviewed with the patient and are negative.  I have reviewed the past medical history, past surgical history, social history and family history with the patient and they are unchanged from previous note.  ALLERGIES:  is allergic to cephalexin.  MEDICATIONS:  Current Outpatient Prescriptions  Medication Sig Dispense Refill  . Ascorbic Acid (VITAMIN C) 100 MG tablet Take 100 mg by mouth daily.    . diazepam (VALIUM) 2 MG tablet Take 1 tablet (2 mg total) by mouth every 12 (twelve) hours. 30 tablet 0  . gabapentin (NEURONTIN) 300 MG capsule Take 1 capsule (300 mg total) by mouth at bedtime. 90 capsule 3  . ibuprofen (ADVIL,MOTRIN) 200 MG tablet Take 200 mg by mouth every 6 (six) hours  as needed for fever or moderate pain. Reported on 03/29/2016    . nab-PACLitaxel CELGENE ABI-007-NSCL-003 (ABRAXANE) 100 MG Inject 100 mg/m2 into the vein.    [name] ondansetron (ZOFRAN-ODT) 4 MG disintegrating tablet Take 1 tablet (4 mg total) by mouth every 6 (six) hours as needed for nausea. 20 tablet 0  . oxyCODONE-acetaminophen (PERCOCET/ROXICET) 5-325 MG tablet Take 1 tablet by  mouth every 4 (four) hours as needed for severe pain.    . polyethylene glycol (MIRALAX / GLYCOLAX) packet Take 17 g by mouth daily as needed for mild constipation. 14 each 0  . prochlorperazine (COMPAZINE) 10 MG tablet Take 10 mg by mouth every 6 (six) hours as needed for nausea or vomiting.     No current facility-administered medications for this visit.     PHYSICAL EXAMINATION: ECOG PERFORMANCE STATUS: 1 - Symptomatic but completely ambulatory  Vitals:   07/12/16 1011  BP: 108/69  Pulse: 74  Resp: 18  Temp: 99 F (37.2 C)   Filed Weights   07/12/16 1011  Weight: 153 lb 4.8 oz (69.5 kg)    GENERAL:alert, no distress and comfortable SKIN: skin color, texture, turgor are normal, no rashes or significant lesions EYES: normal, Conjunctiva are pink and non-injected, sclera clear OROPHARYNX:no exudate, no erythema and lips, buccal mucosa, and tongue normal  NECK: supple, thyroid normal size, non-tender, without nodularity LYMPH:  no palpable lymphadenopathy in the cervical, axillary or inguinal LUNGS: clear to auscultation and percussion with normal breathing effort HEART: regular rate & rhythm and no murmurs and no lower extremity edema ABDOMEN:abdomen soft, non-tender and normal bowel sounds MUSCULOSKELETAL:no cyanosis of digits and no clubbing  NEURO: neuropathy in the tips of the fingers and toes grade 1 EXTREMITIES: No lower extremity edema  LABORATORY DATA:  I have reviewed the data as listed   Chemistry      Component Value Date/Time   NA 142 07/12/2016 0955   K 4.6 07/12/2016 0955   CL 102 03/02/2016 1045   CO2 26 07/12/2016 0955   BUN 9.6 07/12/2016 0955   CREATININE 0.7 07/12/2016 0955      Component Value Date/Time   CALCIUM 9.7 07/12/2016 0955   ALKPHOS 54 07/12/2016 0955   AST 27 07/12/2016 0955   ALT 33 07/12/2016 0955   BILITOT 0.37 07/12/2016 0955       Lab Results  Component Value Date   WBC 2.3 (L) 07/12/2016   HGB 10.6 (L) 07/12/2016   HCT  33.0 (L) 07/12/2016   MCV 96.6 07/12/2016   PLT 316 07/12/2016   NEUTROABS 1.4 (L) 07/12/2016     ASSESSMENT & PLAN:  Breast cancer of upper-outer quadrant of right female breast ([place]) Rt. 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 T2N1 (Stage 2B); Left mastectomy: Fibrocystic ch. Mammaprint high risk  Tumor board recommendation: 1. No indication for repeat resection since there is nothing further to move in the posterior side 2. Mammaprint: High risk, adjuvant chemotherapy with dose dense Adriamycin Cytoxan 4 followed by weekly Abraxane 12 3. Followed by radiation therapy (she may choose to undergo radiation in [place].) 4. Followed by antiestrogen therapy.   ------------------------------------------------------------------------------------------------------- Current Treatment:Completed 4 cycles of AC, today is cycle 10/12 Abraxane  Chemotherapy Side Effects: 1. Bone pain from neulasta use: claritin daily for 3-4 days 2. Mild hypersensitivity reaction during cycle 1: resolved with benadryl and pepcid. No adjustments to future chemo made. 3. Nausea grade  1: Zofran helps 4. Fatigue due to chemotherapy 5. Alopecia 6. Neuropathy grade 1: Monitoring closely . If it gets worse, we may have to reduce the dosage of her treatment. 7. Nails: Fingernails appeared to be detaching from the nailbed. 8. Anemia due to chemotherapy grade 1: I anticipate the hemoglobin to improve slowly with time. Hemoglobin 10.4 9. Neutropenia with cycle 10 of Abraxane ANC 1400: I reduce the dose if Abraxane today. Her goal is to finish the treatment on time.  Patient has made appointments to see Dr. Donella Stade at [facility] for adjuvant radiation. Monitoring closely for toxicities Labs were reviewed in detail. Return to clinic in 2 weeks for cycle 12/12 Abraxane treatment.   No orders of the defined types were placed in this  encounter.  The patient has a good understanding of the overall plan. she agrees with it. she will call with any problems that may develop before the next visit here.   Rulon Eisenmenger, MD 07/12/16

## 2016-07-14 ENCOUNTER — Encounter (INDEPENDENT_AMBULATORY_CARE_PROVIDER_SITE_OTHER): Payer: Self-pay

## 2016-07-14 ENCOUNTER — Ambulatory Visit
Admission: RE | Admit: 2016-07-14 | Discharge: 2016-07-14 | Disposition: A | Discharge status: Home / Self Care | Payer: BLUE CROSS/BLUE SHIELD | Source: Ambulatory Visit | Attending: Radiation Oncology | Admitting: Radiation Oncology

## 2016-07-14 ENCOUNTER — Encounter: Payer: Self-pay | Admitting: Radiation Oncology

## 2016-07-14 VITALS — BP 104/70 | HR 74 | Temp 96.5°F | Resp 20 | Wt 152.8 lb

## 2016-07-14 DIAGNOSIS — C773 Secondary and unspecified malignant neoplasm of axilla and upper limb lymph nodes: Secondary | ICD-10-CM | POA: Insufficient documentation

## 2016-07-14 DIAGNOSIS — Z9013 Acquired absence of bilateral breasts and nipples: Secondary | ICD-10-CM | POA: Diagnosis not present

## 2016-07-14 DIAGNOSIS — Z170 Estrogen receptor positive status [ER+]: Secondary | ICD-10-CM | POA: Insufficient documentation

## 2016-07-14 DIAGNOSIS — C50411 Malignant neoplasm of upper-outer quadrant of right female breast: Secondary | ICD-10-CM | POA: Diagnosis not present

## 2016-07-14 NOTE — Consult Note (Signed)
Except an outstanding is perfect of Radiation Oncology NEW PATIENT EVALUATION  Name: Kiara Spencer  MRN: [id number]  Date:   07/14/2016     DOB: 12/14/1970   This 45 y.o. female patient presents to the clinic for initial evaluation of right breast cancer stage. Stage IIb (T2 N1 M0) ER/PR positive HER-2/neu negative invasive mammary carcinoma status post bilateral mastectomies and adjuvant chemotherapy  REFERRING PHYSICIAN: Brien Few, MD  CHIEF COMPLAINT:  Chief Complaint  Patient presents with  . Breast Cancer    Pt is here for initial consultation of breast cancer.     DIAGNOSIS: The encounter diagnosis was Malignant neoplasm of upper-outer quadrant of right female breast ([place]).   PREVIOUS INVESTIGATIONS:  Pathology reports reviewed Mammogram ultrasound MRI scans reviewed Clinical notes reviewed  HPI: Patient is a 45 year old female presented with a self discovered mass in the tail of her right breast. Mammogram confirm 2 sites of calcifications in the right breast both in the upper outer quadrant of the right breast. Both areas were biopsied the 3.2 cm lesion was invasive ductal carcinoma and ductal carcinoma in situ in the axillary tail region. A 2 cm mass in the more lateral right breast inferiorly was sclerosing adenosis. Both were seen on MRI scan. Patient on to have bilateral mastectomies. Right breast shows a 3 cm grade 1/3 invasive ductal carcinoma and ductal carcinoma in situ. Posterior margin of the axillary tail showed focal positive margin. One sentinel lymph node was positive for metastatic disease. She had 10 other lymph nodes removed another sentinel lymph node was negative and 9 axillary lymph nodes were all negative for metastatic disease. There was diffuse lymphovascular invasion present. Tumor was ER/PR positive HER-2/neu not overexpressed. MammaPrint was performed showing high risk for recurrence with recognition for systemic chemotherapy. As far as the positive  margin surgery so no reason to do further resection. She received it and adjuvant setting dose dense Adriamycin Cytoxan followed by Abraxane. She has 2 more fractions of Abraxane remaining. She has done well. She's had expanders placed in both breasts for reconstruction and that is being planned for sometime in September. She specifically denies breast tenderness cough or bone pain. She has developed some peripheral neuropathy consisting of some numbness in her fingers and toes.  PLANNED TREATMENT REGIMEN: Right breast and peripheral lymphatic radiation  PAST MEDICAL HISTORY:  has a past medical history of Breast cancer ([place]); Breast cancer of upper-outer quadrant of right female breast ([place]) (12/02/2015); and Headache.    PAST SURGICAL HISTORY:  Past Surgical History:  Procedure Laterality Date  . BREAST RECONSTRUCTION WITH PLACEMENT OF TISSUE EXPANDER AND FLEX HD (ACELLULAR HYDRATED DERMIS) Bilateral 02/05/2016   Procedure: BILATERAL BREAST RECONSTRUCTION WITH PLACEMENT OF TISSUE EXPANDER AND FLEX HD (ACELLULAR HYDRATED DERMIS);  Surgeon: Loel Lofty Dillingham, DO;  Location: [place];  Service: Plastics;  Laterality: Bilateral;  . CESAREAN SECTION  x 2  . DILATION AND CURETTAGE OF UTERUS    . MASTECTOMY W/ SENTINEL NODE BIOPSY Right 02/05/2016   Procedure: RIGHT NIPPLE SPARING MASTECTOMY WITH RIGHT AXILLARY SENTINEL LYMPH NODE BIOPSY;  Surgeon: [name], MD;  Location: [place];  Service: General;  Laterality: Right;  . MASTECTOMY, PARTIAL Left 02/05/2016   Procedure: LEFT PROPHYLACTIC NIPPLE SPARING MASTECTOMY ;  Surgeon: [name], MD;  Location: [place];  Service: General;  Laterality: Left;  . PORTACATH PLACEMENT Right 03/02/2016   Procedure: INSERTION PORT-A-CATH WITH ULTRASOUND;  Surgeon: [name], MD;  Location: [place] [facility];  Service:  General;  Laterality: Right;  IJ    FAMILY HISTORY: family history includes Breast cancer in her cousin; Breast cancer (age of onset: 26)  in her mother; Breast cancer (age of onset: 40) in her maternal aunt; Cancer in her cousin and other; Colon polyps in her mother; Heart attack in her maternal grandfather, maternal grandmother, maternal uncle, and paternal aunt; Leukemia (age of onset: 3) in her maternal uncle; Lupus in her maternal aunt; Pancreatic cancer in her cousin; Prostate cancer in her maternal uncle and maternal uncle; Prostate cancer (age of onset: 17) in her maternal uncle; Prostate cancer (age of onset: 18) in her maternal uncle; Throat cancer in her paternal uncle.  SOCIAL HISTORY:  reports that she has never smoked. She has never used smokeless tobacco. She reports that she does not drink alcohol or use drugs.  ALLERGIES: Cephalexin  MEDICATIONS:  Current Outpatient Prescriptions  Medication Sig Dispense Refill  . Biotin 10000 MCG TABS Take by mouth.    . gabapentin (NEURONTIN) 300 MG capsule Take 1 capsule (300 mg total) by mouth at bedtime. 90 capsule 3  . nab-PACLitaxel CELGENE ABI-007-NSCL-003 (ABRAXANE) 100 MG Inject 100 mg/m2 into the vein.    [name] ondansetron (ZOFRAN-ODT) 4 MG disintegrating tablet Take 1 tablet (4 mg total) by mouth every 6 (six) hours as needed for nausea. 20 tablet 0  . oxyCODONE-acetaminophen (PERCOCET/ROXICET) 5-325 MG tablet Take 1 tablet by mouth every 4 (four) hours as needed for severe pain.    . polyethylene glycol (MIRALAX / GLYCOLAX) packet Take 17 g by mouth daily as needed for mild constipation. 14 each 0  . prochlorperazine (COMPAZINE) 10 MG tablet Take 10 mg by mouth every 6 (six) hours as needed for nausea or vomiting.    . Ascorbic Acid (VITAMIN C) 100 MG tablet Take 100 mg by mouth daily.    . diazepam (VALIUM) 2 MG tablet Take 1 tablet (2 mg total) by mouth every 12 (twelve) hours. (Patient not taking: Reported on 07/14/2016) 30 tablet 0  . ibuprofen (ADVIL,MOTRIN) 200 MG tablet Take 200 mg by mouth every 6 (six) hours as needed for fever or moderate pain. Reported on 03/29/2016      No current facility-administered medications for this encounter.     ECOG PERFORMANCE STATUS:  0 - Asymptomatic  REVIEW OF SYSTEMS:  Patient denies any weight loss, fatigue, weakness, fever, chills or night sweats. Patient denies any loss of vision, blurred vision. Patient denies any ringing  of the ears or hearing loss. No irregular heartbeat. Patient denies heart murmur or history of fainting. Patient denies any chest pain or pain radiating to her upper extremities. Patient denies any shortness of breath, difficulty breathing at night, cough or hemoptysis. Patient denies any swelling in the lower legs. Patient denies any nausea vomiting, vomiting of blood, or coffee ground material in the vomitus. Patient denies any stomach pain. Patient states has had normal bowel movements no significant constipation or diarrhea. Patient denies any dysuria, hematuria or significant nocturia. Patient denies any problems walking, swelling in the joints or loss of balance. Patient denies any skin changes, loss of hair or loss of weight. Patient denies any excessive worrying or anxiety or significant depression. Patient denies any problems with insomnia. Patient denies excessive thirst, polyuria, polydipsia. Patient denies any swollen glands, patient denies easy bruising or easy bleeding. Patient denies any recent infections, allergies or URI. Patient "s visual fields have not changed significantly in recent time.    PHYSICAL EXAM: BP 104/70 (  BP Location: Left Arm)   Pulse 74   Temp (!) 96.5 F (35.8 C)   Resp 20   Wt 152 lb 12.5 oz (69.3 kg)   BMI 28.87 kg/m  Patient is status post bilateral mastectomies nipple sparing with expanders placed in both breasts. No dominant mass or nodularity is noted in either breast in 2 positions examined. No axillary or supraclavicular adenopathy is identified. Well-developed well-nourished patient in NAD. HEENT reveals PERLA, EOMI, discs not visualized.  Oral cavity is  clear. No oral mucosal lesions are identified. Neck is clear without evidence of cervical or supraclavicular adenopathy. Lungs are clear to A&P. Cardiac examination is essentially unremarkable with regular rate and rhythm without murmur rub or thrill. Abdomen is benign with no organomegaly or masses noted. Motor sensory and DTR levels are equal and symmetric in the upper and lower extremities. Cranial nerves II through XII are grossly intact. Proprioception is intact. No peripheral adenopathy or edema is identified. No motor or sensory levels are noted. Crude visual fields are within normal range.  LABORATORY DATA: Pathology reports reviewed    RADIOLOGY RESULTS: Mammogram ultrasound and MRI scans reviewed   IMPRESSION: Stage IIb invasive mammary carcinoma the right breast status post bilateral mastectomies and adjuvant chemotherapy in 45 year old female with ER/PR positive HER-2/neu negative invasive mammary carcinoma with positive posterior margin of the right breast.  PLAN: At this time based on her positive margin single lymph node involvement would offer adjuvant whole breast and peripheral lymphatic radiation. Would plan on delivering 5000 cGy to the right breast and supraclavicular region using three-dimensional treatment planning. I would also boost her axillary tail based on her MRI scans and prior mammograms and boost that area another 1600 cGy using electron beam based on her positive margin. Risks and benefits of treatment including fatigue alteration of blood counts skin reaction and possible more significant side effects secondary to breast reconstruction were all discussed in detail with the patient. I've also warned her about possibility of lymphedema in her right upper extremity and have counseled her to try exercises much the right upper extremity is possible to prevent that. I've asked to see her back in follow-up after her breast reconstruction and will make for further plans as far as  treatment planning is concerned at that time depending on her overall general condition.There will be extra effort by both professional staff as well as technical staff to coordinate and manage prior chemotherapy chemoradiation and ensuing side effects during her treatments. Patient knows to call with any concerns.  I would like to take this opportunity to thank you for allowing me to participate in the care of your patient.Armstead Peaks., MD

## 2016-07-16 ENCOUNTER — Other Ambulatory Visit: Payer: Self-pay | Admitting: Hematology and Oncology

## 2016-07-19 ENCOUNTER — Encounter: Payer: Self-pay | Admitting: *Deleted

## 2016-07-19 ENCOUNTER — Ambulatory Visit (HOSPITAL_BASED_OUTPATIENT_CLINIC_OR_DEPARTMENT_OTHER): Payer: BLUE CROSS/BLUE SHIELD

## 2016-07-19 ENCOUNTER — Other Ambulatory Visit (HOSPITAL_BASED_OUTPATIENT_CLINIC_OR_DEPARTMENT_OTHER): Payer: BLUE CROSS/BLUE SHIELD

## 2016-07-19 VITALS — BP 100/73 | HR 75 | Temp 98.0°F | Resp 18

## 2016-07-19 DIAGNOSIS — C773 Secondary and unspecified malignant neoplasm of axilla and upper limb lymph nodes: Secondary | ICD-10-CM | POA: Diagnosis not present

## 2016-07-19 DIAGNOSIS — C50411 Malignant neoplasm of upper-outer quadrant of right female breast: Secondary | ICD-10-CM

## 2016-07-19 DIAGNOSIS — Z5111 Encounter for antineoplastic chemotherapy: Secondary | ICD-10-CM

## 2016-07-19 LAB — CBC WITH DIFFERENTIAL/PLATELET
BASO%: 1.7 % (ref 0.0–2.0)
BASOS ABS: 0 10*3/uL (ref 0.0–0.1)
EOS%: 2.1 % (ref 0.0–7.0)
Eosinophils Absolute: 0.1 10*3/uL (ref 0.0–0.5)
HEMATOCRIT: 35.1 % (ref 34.8–46.6)
HGB: 11.3 g/dL — ABNORMAL LOW (ref 11.6–15.9)
LYMPH#: 0.6 10*3/uL — AB (ref 0.9–3.3)
LYMPH%: 22.7 % (ref 14.0–49.7)
MCH: 30.8 pg (ref 25.1–34.0)
MCHC: 32.1 g/dL (ref 31.5–36.0)
MCV: 95.9 fL (ref 79.5–101.0)
MONO#: 0.3 10*3/uL (ref 0.1–0.9)
MONO%: 11.6 % (ref 0.0–14.0)
NEUT#: 1.6 10*3/uL (ref 1.5–6.5)
NEUT%: 61.9 % (ref 38.4–76.8)
Platelets: 276 10*3/uL (ref 145–400)
RBC: 3.66 10*6/uL — AB (ref 3.70–5.45)
RDW: 13.9 % (ref 11.2–14.5)
WBC: 2.5 10*3/uL — ABNORMAL LOW (ref 3.9–10.3)

## 2016-07-19 LAB — COMPREHENSIVE METABOLIC PANEL
ALT: 25 U/L (ref 0–55)
AST: 25 U/L (ref 5–34)
Albumin: 3.9 g/dL (ref 3.5–5.0)
Alkaline Phosphatase: 54 U/L (ref 40–150)
Anion Gap: 9 mEq/L (ref 3–11)
BUN: 12.3 mg/dL (ref 7.0–26.0)
CALCIUM: 9.8 mg/dL (ref 8.4–10.4)
CHLORIDE: 107 meq/L (ref 98–109)
CO2: 25 meq/L (ref 22–29)
Creatinine: 0.8 mg/dL (ref 0.6–1.1)
EGFR: >90 mL/min/{1.73_m2} (ref >90)
Glucose: 85 mg/dl (ref 70–140)
POTASSIUM: 4.4 meq/L (ref 3.5–5.1)
Sodium: 141 mEq/L (ref 136–145)
Total Bilirubin: 0.43 mg/dL (ref 0.20–1.20)
Total Protein: 6.9 g/dL (ref 6.4–8.3)

## 2016-07-19 MED ORDER — SODIUM CHLORIDE 0.9 % IV SOLN
Freq: Once | INTRAVENOUS | Status: AC
  Administered 2016-07-19: 10:00:00 via INTRAVENOUS

## 2016-07-19 MED ORDER — PROCHLORPERAZINE MALEATE 10 MG PO TABS
10.0000 mg | ORAL_TABLET | Freq: Once | ORAL | Status: AC
  Administered 2016-07-19: 10 mg via ORAL

## 2016-07-19 MED ORDER — SODIUM CHLORIDE 0.9% FLUSH
10.0000 mL | INTRAVENOUS | Status: DC | PRN
  Administered 2016-07-19: 10 mL
  Filled 2016-07-19: qty 10

## 2016-07-19 MED ORDER — HEPARIN SOD (PORK) LOCK FLUSH 100 UNIT/ML IV SOLN
500.0000 [IU] | Freq: Once | INTRAVENOUS | Status: AC | PRN
  Administered 2016-07-19: 500 [IU]
  Filled 2016-07-19: qty 5

## 2016-07-19 MED ORDER — PROCHLORPERAZINE MALEATE 10 MG PO TABS
ORAL_TABLET | ORAL | Status: AC
  Filled 2016-07-19: qty 1

## 2016-07-19 MED ORDER — PACLITAXEL PROTEIN-BOUND CHEMO INJECTION 100 MG
65.0000 mg/m2 | Freq: Once | INTRAVENOUS | Status: AC
Start: 2016-07-19 — End: 2016-07-19
  Administered 2016-07-19: 100 mg via INTRAVENOUS
  Filled 2016-07-19: qty 20

## 2016-07-19 NOTE — Patient Instructions (Signed)
[facility] Discharge Instructions for Patients Receiving Chemotherapy  Today you received the following chemotherapy agents: Abraxane.  To help prevent nausea and vomiting after your treatment, we encourage you to take your nausea medication as directed by MD.  If you develop nausea and vomiting that is not controlled by your nausea medication, call the clinic.   BELOW ARE SYMPTOMS THAT SHOULD BE REPORTED IMMEDIATELY:  *FEVER GREATER THAN 100.5 F  *CHILLS WITH OR WITHOUT FEVER  NAUSEA AND VOMITING THAT IS NOT CONTROLLED WITH YOUR NAUSEA MEDICATION  *UNUSUAL SHORTNESS OF BREATH  *UNUSUAL BRUISING OR BLEEDING  TENDERNESS IN MOUTH AND THROAT WITH OR WITHOUT PRESENCE OF ULCERS  *URINARY PROBLEMS  *BOWEL PROBLEMS  UNUSUAL RASH Items with * indicate a potential emergency and should be followed up as soon as possible.  Feel free to call the clinic you have any questions or concerns. The clinic phone number is [phone].  Please show the [facility] at check-in to the Emergency Department and triage nurse.

## 2016-07-20 ENCOUNTER — Encounter: Payer: Self-pay | Admitting: Hematology and Oncology

## 2016-07-20 NOTE — Progress Notes (Signed)
forms left in box. I left for dr Lindi Adie to sign

## 2016-07-22 ENCOUNTER — Encounter: Payer: Self-pay | Admitting: Hematology and Oncology

## 2016-07-22 NOTE — Progress Notes (Signed)
forms left in box. I left for dr Lindi Adie to sign- faxed and mailed copy to patient and left for patient pk up at front

## 2016-07-26 ENCOUNTER — Ambulatory Visit (HOSPITAL_BASED_OUTPATIENT_CLINIC_OR_DEPARTMENT_OTHER): Payer: BLUE CROSS/BLUE SHIELD

## 2016-07-26 ENCOUNTER — Encounter: Payer: Self-pay | Admitting: Hematology and Oncology

## 2016-07-26 ENCOUNTER — Other Ambulatory Visit (HOSPITAL_BASED_OUTPATIENT_CLINIC_OR_DEPARTMENT_OTHER): Payer: BLUE CROSS/BLUE SHIELD

## 2016-07-26 ENCOUNTER — Ambulatory Visit (HOSPITAL_BASED_OUTPATIENT_CLINIC_OR_DEPARTMENT_OTHER): Payer: BLUE CROSS/BLUE SHIELD | Admitting: Hematology and Oncology

## 2016-07-26 ENCOUNTER — Encounter: Payer: Self-pay | Admitting: *Deleted

## 2016-07-26 ENCOUNTER — Telehealth: Payer: Self-pay | Admitting: Hematology and Oncology

## 2016-07-26 DIAGNOSIS — L603 Nail dystrophy: Secondary | ICD-10-CM

## 2016-07-26 DIAGNOSIS — C773 Secondary and unspecified malignant neoplasm of axilla and upper limb lymph nodes: Secondary | ICD-10-CM | POA: Diagnosis not present

## 2016-07-26 DIAGNOSIS — D6481 Anemia due to antineoplastic chemotherapy: Secondary | ICD-10-CM | POA: Diagnosis not present

## 2016-07-26 DIAGNOSIS — Z5111 Encounter for antineoplastic chemotherapy: Secondary | ICD-10-CM

## 2016-07-26 DIAGNOSIS — C50411 Malignant neoplasm of upper-outer quadrant of right female breast: Secondary | ICD-10-CM

## 2016-07-26 DIAGNOSIS — L658 Other specified nonscarring hair loss: Secondary | ICD-10-CM

## 2016-07-26 DIAGNOSIS — D701 Agranulocytosis secondary to cancer chemotherapy: Secondary | ICD-10-CM

## 2016-07-26 DIAGNOSIS — R530 Neoplastic (malignant) related fatigue: Secondary | ICD-10-CM

## 2016-07-26 LAB — CBC WITH DIFFERENTIAL/PLATELET
BASO%: 1.7 % (ref 0.0–2.0)
BASOS ABS: 0 10*3/uL (ref 0.0–0.1)
EOS ABS: 0.1 10*3/uL (ref 0.0–0.5)
EOS%: 2 % (ref 0.0–7.0)
HEMATOCRIT: 34.1 % — AB (ref 34.8–46.6)
HEMOGLOBIN: 11.1 g/dL — AB (ref 11.6–15.9)
LYMPH#: 0.7 10*3/uL — AB (ref 0.9–3.3)
LYMPH%: 27 % (ref 14.0–49.7)
MCH: 31 pg (ref 25.1–34.0)
MCHC: 32.5 g/dL (ref 31.5–36.0)
MCV: 95.6 fL (ref 79.5–101.0)
MONO#: 0.3 10*3/uL (ref 0.1–0.9)
MONO%: 10.3 % (ref 0.0–14.0)
NEUT#: 1.5 10*3/uL (ref 1.5–6.5)
NEUT%: 59 % (ref 38.4–76.8)
PLATELETS: 306 10*3/uL (ref 145–400)
RBC: 3.57 10*6/uL — ABNORMAL LOW (ref 3.70–5.45)
RDW: 13.6 % (ref 11.2–14.5)
WBC: 2.6 10*3/uL — AB (ref 3.9–10.3)

## 2016-07-26 LAB — COMPREHENSIVE METABOLIC PANEL
ALBUMIN: 3.7 g/dL (ref 3.5–5.0)
ALK PHOS: 51 U/L (ref 40–150)
ALT: 18 U/L (ref 0–55)
ANION GAP: 8 meq/L (ref 3–11)
AST: 22 U/L (ref 5–34)
BUN: 9.3 mg/dL (ref 7.0–26.0)
CALCIUM: 9.4 mg/dL (ref 8.4–10.4)
CO2: 25 mEq/L (ref 22–29)
Chloride: 110 mEq/L — ABNORMAL HIGH (ref 98–109)
Creatinine: 0.7 mg/dL (ref 0.6–1.1)
Glucose: 94 mg/dl (ref 70–140)
POTASSIUM: 4.6 meq/L (ref 3.5–5.1)
Sodium: 143 mEq/L (ref 136–145)
Total Bilirubin: 0.3 mg/dL (ref 0.20–1.20)
Total Protein: 6.4 g/dL (ref 6.4–8.3)

## 2016-07-26 MED ORDER — PROCHLORPERAZINE MALEATE 10 MG PO TABS
ORAL_TABLET | ORAL | Status: AC
  Filled 2016-07-26: qty 1

## 2016-07-26 MED ORDER — PACLITAXEL PROTEIN-BOUND CHEMO INJECTION 100 MG
65.0000 mg/m2 | Freq: Once | INTRAVENOUS | Status: AC
  Administered 2016-07-26: 100 mg via INTRAVENOUS
  Filled 2016-07-26: qty 20

## 2016-07-26 MED ORDER — HEPARIN SOD (PORK) LOCK FLUSH 100 UNIT/ML IV SOLN
500.0000 [IU] | Freq: Once | INTRAVENOUS | Status: AC | PRN
  Administered 2016-07-26: 500 [IU]
  Filled 2016-07-26: qty 5

## 2016-07-26 MED ORDER — SODIUM CHLORIDE 0.9% FLUSH
10.0000 mL | INTRAVENOUS | Status: DC | PRN
  Administered 2016-07-26: 10 mL
  Filled 2016-07-26: qty 10

## 2016-07-26 MED ORDER — PROCHLORPERAZINE MALEATE 10 MG PO TABS
10.0000 mg | ORAL_TABLET | Freq: Once | ORAL | Status: AC
  Administered 2016-07-26: 10 mg via ORAL

## 2016-07-26 MED ORDER — SODIUM CHLORIDE 0.9 % IV SOLN
Freq: Once | INTRAVENOUS | Status: AC
  Administered 2016-07-26: 10:00:00 via INTRAVENOUS

## 2016-07-26 NOTE — Progress Notes (Signed)
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 [facility])   Staging form: Breast, AJCC 7th Edition   - Clinical stage from 12/10/2015: Stage IIA (T2, N0, M0) - Unsigned         Staging comments: Staged at breast conference on 1.4.17  SUMMARY OF ONCOLOGIC HISTORY:   Breast cancer of upper-outer quadrant of right female breast ([place])   11/24/2015 Mammogram    2 suspicious groups of calcifications right breast UOQ posteriorly 2.2 x 2.3 x 2.5 cm (by U/S measured 1.2 x 0.8 x 1.2 cm); anteriorly 1.1 x 1.2 x 0.8 cm (not seen by ultrasound)      11/27/2015 Initial Diagnosis    Right breast biopsy UOQ Posterior: IDC with DCIS with, LVI present, ER 100%, PR 20%, Ki-67 30%, HER-2 negative; ANTERIOR: Complex sclerosing lesions with calcifications      02/05/2016 Surgery    Rt. Mastectomy: IDC Grade 1, 3 cm, 1/11 LN positive,with IG DCIS 0.1 cm margin, Diffuse LVI, cancer at axill tail. T2N1 (Stage 2B); Left mastectomy: Fibrocystic ch, Mammaprint high risk        03/15/2016 -  Chemotherapy    Dose dense Adriamycin and Cytoxan 4 followed by Abraxane weekly 12       CHIEF COMPLIANT: Cycle 12 Abraxane last cycle of chemotherapy  INTERVAL HISTORY: Kiara Spencer is a 57 over the above-mentioned history of adjuvant chemotherapy for breast cancer who is here to receive her last and final treatment with Abraxane. She had done extremely well from chemotherapy standpoint and is very happy and relieved to be done with treatment. She did not have any nausea or vomiting. He is not that were fine. She does have moderate fatigue.  REVIEW OF  SYSTEMS:   Constitutional: Denies fevers, chills or abnormal weight loss Eyes: Denies blurriness of vision Ears, nose, mouth, throat, and face: Denies mucositis or sore throat Respiratory: Denies cough, dyspnea or wheezes Cardiovascular: Denies palpitation, chest discomfort Gastrointestinal:  Denies nausea, heartburn or change in bowel habits Skin: Denies abnormal skin rashes Lymphatics: Denies new lymphadenopathy or easy bruising Neurological:Denies numbness, tingling or new weaknesses Behavioral/Psych: Mood is stable, no new changes  Extremities: No lower extremity edema  All other systems were reviewed with the patient and are negative.  I have reviewed the past medical history, past surgical history, social history and family history with the patient and they are unchanged from previous note.  ALLERGIES:  is allergic to cephalexin.  MEDICATIONS:  Current Outpatient Prescriptions  Medication Sig Dispense Refill  . Ascorbic Acid (VITAMIN C) 100 MG tablet Take 100 mg by mouth daily.    . Biotin 10000 MCG TABS Take by mouth.    . diazepam (VALIUM) 2 MG tablet Take 1 tablet (2 mg total) by mouth every 12 (twelve) hours. (Patient not taking: Reported on 07/14/2016) 30 tablet 0  . gabapentin (NEURONTIN) 300 MG capsule Take 1 capsule (300 mg total) by mouth at bedtime. 90 capsule 3  . ibuprofen (ADVIL,MOTRIN) 200 MG tablet Take 200 mg by mouth every 6 (six) hours as needed for fever or moderate pain. Reported on 03/29/2016    . nab-PACLitaxel CELGENE ABI-[id number] (ABRAXANE) 100 MG Inject 100 mg/m2 into the vein.    [name]  ondansetron (ZOFRAN-ODT) 4 MG disintegrating tablet Take 1 tablet (4 mg total) by mouth every 6 (six) hours as needed for nausea. 20 tablet 0  . oxyCODONE-acetaminophen (PERCOCET/ROXICET) 5-325 MG tablet Take 1 tablet by mouth every 4 (four) hours as needed for severe pain.    . polyethylene glycol (MIRALAX / GLYCOLAX) packet Take 17 g by mouth daily as needed for mild  constipation. 14 each 0  . prochlorperazine (COMPAZINE) 10 MG tablet Take 10 mg by mouth every 6 (six) hours as needed for nausea or vomiting.     No current facility-administered medications for this visit.    Facility-Administered Medications Ordered in Other Visits  Medication Dose Route Frequency Provider Last Rate Last Dose  . heparin lock flush 100 unit/mL  500 Units Intracatheter Once PRN Nicholas Lose, MD      . PACLitaxel-protein bound (ABRAXANE) chemo infusion 100 mg  65 mg/m2 (Order-Specific) Intravenous Once Nicholas Lose, MD 40 mL/hr at 07/26/16 1048 100 mg at 07/26/16 1048  . sodium chloride flush (NS) 0.9 % injection 10 mL  10 mL Intracatheter PRN Nicholas Lose, MD        PHYSICAL EXAMINATION: ECOG PERFORMANCE STATUS: 1 - Symptomatic but completely ambulatory  Vitals:   07/26/16 0944  BP: 103/73  Pulse: 64  Resp: 18  Temp: 98.5 F (36.9 C)   Filed Weights   07/26/16 0944  Weight: 155 lb 6.4 oz (70.5 kg)    GENERAL:alert, no distress and comfortable SKIN: skin color, texture, turgor are normal, no rashes or significant lesions EYES: normal, Conjunctiva are pink and non-injected, sclera clear OROPHARYNX:no exudate, no erythema and lips, buccal mucosa, and tongue normal  NECK: supple, thyroid normal size, non-tender, without nodularity LYMPH:  no palpable lymphadenopathy in the cervical, axillary or inguinal LUNGS: clear to auscultation and percussion with normal breathing effort HEART: regular rate & rhythm and no murmurs and no lower extremity edema ABDOMEN:abdomen soft, non-tender and normal bowel sounds MUSCULOSKELETAL:no cyanosis of digits and no clubbing  NEURO: alert & oriented x 3 with fluent speech, no focal motor/sensory deficits EXTREMITIES: No lower extremity edema  LABORATORY DATA:  I have reviewed the data as listed   Chemistry      Component Value Date/Time   NA 143 07/26/2016 0924   K 4.6 07/26/2016 0924   CL 102 03/02/2016 1045   CO2 25  07/26/2016 0924   BUN 9.3 07/26/2016 0924   CREATININE 0.7 07/26/2016 0924      Component Value Date/Time   CALCIUM 9.4 07/26/2016 0924   ALKPHOS 51 07/26/2016 0924   AST 22 07/26/2016 0924   ALT 18 07/26/2016 0924   BILITOT 0.30 07/26/2016 0924       Lab Results  Component Value Date   WBC 2.6 (L) 07/26/2016   HGB 11.1 (L) 07/26/2016   HCT 34.1 (L) 07/26/2016   MCV 95.6 07/26/2016   PLT 306 07/26/2016   NEUTROABS 1.5 07/26/2016     ASSESSMENT & PLAN:  Breast cancer of upper-outer quadrant of right female breast ([place]) Rt. 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 T2N1 (Stage 2B); Left mastectomy: Fibrocystic ch. Mammaprint high risk  Tumor board recommendation: 1. No indication for repeat resection since there is nothing further to move in the posterior side 2. Mammaprint: High risk, adjuvant chemotherapy with dose dense Adriamycin Cytoxan 4 followed by weekly Abraxane 12 3. Followed by radiation therapy (she may choose to  undergo radiation in [place].) 4. Followed by antiestrogen therapy.   ------------------------------------------------------------------------------------------------------- Current Treatment:Completed 4 cycles of AC, today is cycle 12/12 Abraxane  Chemotherapy Side Effects: 1. Bone pain from neulasta use: claritin daily for 3-4 days 2. Mild hypersensitivity reaction during cycle 1: resolved with benadryl and pepcid. No adjustments to future chemo made. 3. Nausea grade 1: Zofran helps 4. Fatigue due to chemotherapy 5. Alopecia 6. Neuropathy grade 1: Monitoring closely . If it gets worse, we may have to reduce the dosage of her treatment. 7. Nails: Fingernails appeared to be detaching from the nailbed. 8. Anemia due to chemotherapy grade 1: I anticipate the hemoglobin to improve slowly with time. Hemoglobin 10.4 9. Neutropenia with cycle 10 of Abraxane ANC 1400: I  reduce the dose if Abraxane today. Her goal is to finish the treatment on time.  Patient has made appointments to see Dr. Donella Stade at [facility] for adjuvant radiation. Radiation will start after the breast implant has been exchanged.  Labs were reviewed in detail. Return to clinic after radiation is complete in about 3-4 months to start antiestrogen therapy.   No orders of the defined types were placed in this encounter.  The patient has a good understanding of the overall plan. she agrees with it. she will call with any problems that may develop before the next visit here.   Rulon Eisenmenger, MD 07/26/16

## 2016-07-26 NOTE — Telephone Encounter (Signed)
appt made and avs to print in treatment room °

## 2016-07-26 NOTE — Patient Instructions (Signed)
[facility] Discharge Instructions for Patients Receiving Chemotherapy  Today you received the following chemotherapy agents abraxane.   To help prevent nausea and vomiting after your treatment, we encourage you to take your nausea medication as directed.   If you develop nausea and vomiting that is not controlled by your nausea medication, call the clinic.   BELOW ARE SYMPTOMS THAT SHOULD BE REPORTED IMMEDIATELY:  *FEVER GREATER THAN 100.5 F  *CHILLS WITH OR WITHOUT FEVER  NAUSEA AND VOMITING THAT IS NOT CONTROLLED WITH YOUR NAUSEA MEDICATION  *UNUSUAL SHORTNESS OF BREATH  *UNUSUAL BRUISING OR BLEEDING  TENDERNESS IN MOUTH AND THROAT WITH OR WITHOUT PRESENCE OF ULCERS  *URINARY PROBLEMS  *BOWEL PROBLEMS  UNUSUAL RASH Items with * indicate a potential emergency and should be followed up as soon as possible.  Feel free to call the clinic you have any questions or concerns. The clinic phone number is [phone].  

## 2016-07-26 NOTE — Assessment & Plan Note (Signed)
Rt. 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 T2N1 (Stage 2B); Left mastectomy: Fibrocystic ch. Mammaprint high risk  Tumor board recommendation: 1. No indication for repeat resection since there is nothing further to move in the posterior side 2. Mammaprint: High risk, adjuvant chemotherapy with dose dense Adriamycin Cytoxan 4 followed by weekly Abraxane 12 3. Followed by radiation therapy (she may choose to undergo radiation in [place].) 4. Followed by antiestrogen therapy.   ------------------------------------------------------------------------------------------------------- Current Treatment:Completed 4 cycles of AC, today is cycle 12/12 Abraxane  Chemotherapy Side Effects: 1. Bone pain from neulasta use: claritin daily for 3-4 days 2. Mild hypersensitivity reaction during cycle 1: resolved with benadryl and pepcid. No adjustments to future chemo made. 3. Nausea grade 1: Zofran helps 4. Fatigue due to chemotherapy 5. Alopecia 6. Neuropathy grade 1: Monitoring closely . If it gets worse, we may have to reduce the dosage of her treatment. 7. Nails: Fingernails appeared to be detaching from the nailbed. 8. Anemia due to chemotherapy grade 1: I anticipate the hemoglobin to improve slowly with time. Hemoglobin 10.4 9. Neutropenia with cycle 10 of Abraxane ANC 1400: I reduce the dose if Abraxane today. Her goal is to finish the treatment on time.  Patient has made appointments to see Dr. Donella Stade at [facility] for adjuvant radiation. Monitoring closely for toxicities Labs were reviewed in detail. Return to clinic after radiation is complete in about 3 months to start antiestrogen therapy.

## 2016-07-28 ENCOUNTER — Institutional Professional Consult (permissible substitution): Payer: BLUE CROSS/BLUE SHIELD | Admitting: Radiation Oncology

## 2016-09-08 ENCOUNTER — Encounter (HOSPITAL_BASED_OUTPATIENT_CLINIC_OR_DEPARTMENT_OTHER): Payer: Self-pay | Admitting: *Deleted

## 2016-09-10 ENCOUNTER — Ambulatory Visit: Payer: Self-pay | Admitting: Plastic Surgery

## 2016-09-10 DIAGNOSIS — Z853 Personal history of malignant neoplasm of breast: Secondary | ICD-10-CM

## 2016-09-10 DIAGNOSIS — Z9013 Acquired absence of bilateral breasts and nipples: Secondary | ICD-10-CM

## 2016-09-10 NOTE — H&P (Signed)
Kiara Spencer is an 45 y.o. female.   Chief Complaint: bilateral acquired absence of breasts HPI: The patient is a 45 yrs old bf here for pre operative history and physical prior to exchange surgery with removal of bilateral TE and placement of silicone implants. 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 Radiation is planned following exchange surgery.  She would also like her port removed at the time of exchange surgery.  Expanders have a  total of 360/350 cc.  History: She felt a mass in the right breast area. 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 She had genetic counseling and it was negative. She is seeing Drs. Moody and Gudena. She has two children and breast feed both for ~ 1 year. She is 5 feet 1 inch tall, weighs 150 pounds and preop bra= 36B. She would like to be a C cup.  She is not a smoker.  Past Medical History:  Diagnosis Date  . Breast cancer (HCC)   . Breast cancer of upper-outer quadrant of right female breast (HCC) 12/02/2015  . Headache    hx migraines    Past Surgical History:  Procedure Laterality Date  . BREAST RECONSTRUCTION WITH PLACEMENT OF TISSUE EXPANDER AND FLEX HD (ACELLULAR HYDRATED DERMIS) Bilateral 02/05/2016   Procedure: BILATERAL BREAST RECONSTRUCTION WITH PLACEMENT OF TISSUE EXPANDER AND FLEX HD (ACELLULAR HYDRATED DERMIS);  Surgeon: Clydell Sposito S Aisia Correira, DO;  Location: [facility] OR;  Service: Plastics;  Laterality: Bilateral;  . CESAREAN SECTION  x 2  . DILATION AND CURETTAGE OF UTERUS    . MASTECTOMY W/ SENTINEL NODE BIOPSY Right 02/05/2016   Procedure: RIGHT NIPPLE SPARING MASTECTOMY WITH RIGHT AXILLARY SENTINEL LYMPH NODE BIOPSY;  Surgeon: Haywood Ingram, MD;  Location: [facility] OR;  Service: General;  Laterality: Right;  . MASTECTOMY, PARTIAL Left 02/05/2016   Procedure: LEFT PROPHYLACTIC NIPPLE  SPARING MASTECTOMY ;  Surgeon: Haywood Ingram, MD;  Location: [facility] OR;  Service: General;  Laterality: Left;  . PORTACATH PLACEMENT Right 03/02/2016   Procedure: INSERTION PORT-A-CATH WITH ULTRASOUND;  Surgeon: Haywood Ingram, MD;  Location: [facility];  Service: General;  Laterality: Right;  IJ    Family History  Problem Relation Age of Onset  . Breast cancer Mother 63    s/p partial mastectomy  . Colon polyps Mother     3 polyps total  . Breast cancer Maternal Aunt 64  . Leukemia Maternal Uncle 83  . Heart attack Paternal Aunt   . Heart attack Maternal Grandmother   . Heart attack Maternal Grandfather   . Lupus Maternal Aunt   . Prostate cancer Maternal Uncle 68  . Prostate cancer Maternal Uncle 72  . Prostate cancer Maternal Uncle   . Heart attack Maternal Uncle   . Prostate cancer Maternal Uncle     dx. 50s   . Breast cancer Cousin     dx. early 40s or younger; may have had genetic testing  . Cancer Cousin     "rare" cancer, dx. 40s  . Pancreatic cancer Cousin     dx. 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 Throat cancer Paternal Uncle     dx. early 70s  . Cancer Other     unspecified type   Social History:  reports that she has never smoked. She has   never used smokeless tobacco. She reports that she does not drink alcohol or use drugs.  Allergies:  Allergies  Allergen Reactions  . Cephalexin Rash     (Not in a hospital admission)  No results found for this or any previous visit (from the past 48 hour(s)). No results found.  Review of Systems  Constitutional: Negative.   HENT: Negative.   Eyes: Negative.   Respiratory: Negative.   Cardiovascular: Negative.   Gastrointestinal: Negative.   Genitourinary: Negative.   Skin: Negative.   Neurological: Negative.   Psychiatric/Behavioral: Negative.     There were no vitals taken for this visit. Physical Exam  Constitutional: She is oriented to person,  place, and time. She appears well-developed and well-nourished.  HENT:  Head: Normocephalic and atraumatic.  Eyes: Conjunctivae are normal. Pupils are equal, round, and reactive to light.  Cardiovascular: Normal rate.   Respiratory: Effort normal. No respiratory distress.  GI: She exhibits no distension.  Neurological: She is alert and oriented to person, place, and time.  Skin: Skin is warm.  Psychiatric: She has a normal mood and affect. Her behavior is normal. Judgment normal.     Assessment/Plan Bilateral breast reconstruction with removal of breast expanders and placement of silicone implants.  Wallace Going, DO 09/10/2016, 1:44 PM

## 2016-09-16 ENCOUNTER — Encounter (HOSPITAL_BASED_OUTPATIENT_CLINIC_OR_DEPARTMENT_OTHER)
Admission: RE | Disposition: A | Discharge status: Home / Self Care | Payer: Self-pay | Source: Ambulatory Visit | Attending: Plastic Surgery

## 2016-09-16 ENCOUNTER — Ambulatory Visit (HOSPITAL_BASED_OUTPATIENT_CLINIC_OR_DEPARTMENT_OTHER)
Admission: RE | Admit: 2016-09-16 | Discharge: 2016-09-16 | Disposition: A | Discharge status: Home / Self Care | Payer: BLUE CROSS/BLUE SHIELD | Source: Ambulatory Visit | Attending: Plastic Surgery | Admitting: Plastic Surgery

## 2016-09-16 ENCOUNTER — Ambulatory Visit (HOSPITAL_BASED_OUTPATIENT_CLINIC_OR_DEPARTMENT_OTHER): Payer: BLUE CROSS/BLUE SHIELD | Admitting: Anesthesiology

## 2016-09-16 ENCOUNTER — Encounter (HOSPITAL_BASED_OUTPATIENT_CLINIC_OR_DEPARTMENT_OTHER): Payer: Self-pay

## 2016-09-16 DIAGNOSIS — Z803 Family history of malignant neoplasm of breast: Secondary | ICD-10-CM | POA: Diagnosis not present

## 2016-09-16 DIAGNOSIS — Z421 Encounter for breast reconstruction following mastectomy: Secondary | ICD-10-CM | POA: Insufficient documentation

## 2016-09-16 DIAGNOSIS — Z8489 Family history of other specified conditions: Secondary | ICD-10-CM | POA: Insufficient documentation

## 2016-09-16 DIAGNOSIS — Z809 Family history of malignant neoplasm, unspecified: Secondary | ICD-10-CM | POA: Diagnosis not present

## 2016-09-16 DIAGNOSIS — Z800 Family history of malignant neoplasm of digestive organs: Secondary | ICD-10-CM | POA: Diagnosis not present

## 2016-09-16 DIAGNOSIS — Z170 Estrogen receptor positive status [ER+]: Secondary | ICD-10-CM | POA: Diagnosis not present

## 2016-09-16 DIAGNOSIS — Z853 Personal history of malignant neoplasm of breast: Secondary | ICD-10-CM | POA: Insufficient documentation

## 2016-09-16 DIAGNOSIS — Z8042 Family history of malignant neoplasm of prostate: Secondary | ICD-10-CM | POA: Diagnosis not present

## 2016-09-16 DIAGNOSIS — Z881 Allergy status to other antibiotic agents status: Secondary | ICD-10-CM | POA: Insufficient documentation

## 2016-09-16 DIAGNOSIS — Z9013 Acquired absence of bilateral breasts and nipples: Secondary | ICD-10-CM

## 2016-09-16 DIAGNOSIS — Z8371 Family history of colonic polyps: Secondary | ICD-10-CM | POA: Diagnosis not present

## 2016-09-16 DIAGNOSIS — Z806 Family history of leukemia: Secondary | ICD-10-CM | POA: Diagnosis not present

## 2016-09-16 HISTORY — PX: PERIPHERAL VASCULAR CATHETERIZATION: SHX172C

## 2016-09-16 HISTORY — PX: REMOVAL OF BILATERAL TISSUE EXPANDERS WITH PLACEMENT OF BILATERAL BREAST IMPLANTS: SHX6431

## 2016-09-16 SURGERY — REMOVAL, TISSUE EXPANDER, BREAST, BILATERAL, WITH BILATERAL IMPLANT IMPLANT INSERTION
Anesthesia: General | Site: Chest | Laterality: Right

## 2016-09-16 MED ORDER — LIDOCAINE 2% (20 MG/ML) 5 ML SYRINGE
INTRAMUSCULAR | Status: DC | PRN
  Administered 2016-09-16: 80 mg via INTRAVENOUS

## 2016-09-16 MED ORDER — GLYCOPYRROLATE 0.2 MG/ML IJ SOLN: 0.2000 mg | Freq: Once | INTRAMUSCULAR | Status: DC | PRN

## 2016-09-16 MED ORDER — SCOPOLAMINE 1 MG/3DAYS TD PT72: 1.0000 | MEDICATED_PATCH | Freq: Once | TRANSDERMAL | Status: DC | PRN

## 2016-09-16 MED ORDER — FENTANYL CITRATE (PF) 100 MCG/2ML IJ SOLN
INTRAMUSCULAR | Status: AC
  Filled 2016-09-16: qty 2

## 2016-09-16 MED ORDER — PROPOFOL 10 MG/ML IV BOLUS
INTRAVENOUS | Status: DC | PRN
  Administered 2016-09-16: 30 mg via INTRAVENOUS
  Administered 2016-09-16: 150 mg via INTRAVENOUS
  Administered 2016-09-16: 20 mg via INTRAVENOUS
  Administered 2016-09-16: 50 mg via INTRAVENOUS

## 2016-09-16 MED ORDER — FENTANYL CITRATE (PF) 100 MCG/2ML IJ SOLN
25.0000 ug | INTRAMUSCULAR | Status: DC | PRN
  Administered 2016-09-16: 50 ug via INTRAVENOUS

## 2016-09-16 MED ORDER — ONDANSETRON HCL 4 MG/2ML IJ SOLN
INTRAMUSCULAR | Status: AC
  Filled 2016-09-16: qty 2

## 2016-09-16 MED ORDER — SUCCINYLCHOLINE CHLORIDE 200 MG/10ML IV SOSY
PREFILLED_SYRINGE | INTRAVENOUS | Status: DC | PRN
  Administered 2016-09-16: 100 mg via INTRAVENOUS

## 2016-09-16 MED ORDER — OXYCODONE HCL 5 MG/5ML PO SOLN: 5.0000 mg | Freq: Once | ORAL | Status: AC | PRN

## 2016-09-16 MED ORDER — MIDAZOLAM HCL 2 MG/2ML IJ SOLN
1.0000 mg | INTRAMUSCULAR | Status: DC | PRN
  Administered 2016-09-16: 2 mg via INTRAVENOUS

## 2016-09-16 MED ORDER — OXYCODONE HCL 5 MG PO TABS
ORAL_TABLET | ORAL | Status: AC
  Filled 2016-09-16: qty 1

## 2016-09-16 MED ORDER — CHLORHEXIDINE GLUCONATE CLOTH 2 % EX PADS: 6.0000 | MEDICATED_PAD | Freq: Once | CUTANEOUS | Status: DC

## 2016-09-16 MED ORDER — ONDANSETRON HCL 4 MG/2ML IJ SOLN
INTRAMUSCULAR | Status: DC | PRN
  Administered 2016-09-16: 4 mg via INTRAVENOUS

## 2016-09-16 MED ORDER — FENTANYL CITRATE (PF) 100 MCG/2ML IJ SOLN
50.0000 ug | INTRAMUSCULAR | Status: AC | PRN
  Administered 2016-09-16 (×2): 50 ug via INTRAVENOUS
  Administered 2016-09-16 (×2): 25 ug via INTRAVENOUS

## 2016-09-16 MED ORDER — PROMETHAZINE HCL 25 MG/ML IJ SOLN: 6.2500 mg | INTRAMUSCULAR | Status: DC | PRN

## 2016-09-16 MED ORDER — SUCCINYLCHOLINE CHLORIDE 200 MG/10ML IV SOSY
PREFILLED_SYRINGE | INTRAVENOUS | Status: AC
  Filled 2016-09-16: qty 10

## 2016-09-16 MED ORDER — OXYCODONE HCL 5 MG PO TABS
5.0000 mg | ORAL_TABLET | Freq: Once | ORAL | Status: AC | PRN
  Administered 2016-09-16: 5 mg via ORAL

## 2016-09-16 MED ORDER — MIDAZOLAM HCL 2 MG/2ML IJ SOLN
INTRAMUSCULAR | Status: AC
  Filled 2016-09-16: qty 2

## 2016-09-16 MED ORDER — DEXAMETHASONE SODIUM PHOSPHATE 10 MG/ML IJ SOLN
INTRAMUSCULAR | Status: AC
  Filled 2016-09-16: qty 1

## 2016-09-16 MED ORDER — CIPROFLOXACIN IN D5W 400 MG/200ML IV SOLN
INTRAVENOUS | Status: AC
  Filled 2016-09-16: qty 200

## 2016-09-16 MED ORDER — LACTATED RINGERS IV SOLN
INTRAVENOUS | Status: DC
  Administered 2016-09-16 (×2): via INTRAVENOUS

## 2016-09-16 MED ORDER — DEXAMETHASONE SODIUM PHOSPHATE 4 MG/ML IJ SOLN
INTRAMUSCULAR | Status: DC | PRN
  Administered 2016-09-16: 10 mg via INTRAVENOUS

## 2016-09-16 MED ORDER — PROPOFOL 10 MG/ML IV BOLUS
INTRAVENOUS | Status: AC
  Filled 2016-09-16: qty 20

## 2016-09-16 MED ORDER — CIPROFLOXACIN IN D5W 400 MG/200ML IV SOLN
400.0000 mg | INTRAVENOUS | Status: AC
  Administered 2016-09-16: 400 mg via INTRAVENOUS

## 2016-09-16 MED ORDER — ACETAMINOPHEN 10 MG/ML IV SOLN
INTRAVENOUS | Status: DC | PRN
  Administered 2016-09-16: 1000 mg via INTRAVENOUS

## 2016-09-16 MED ORDER — BUPIVACAINE-EPINEPHRINE 0.25% -1:200000 IJ SOLN
INTRAMUSCULAR | Status: DC | PRN
  Administered 2016-09-16: 5 mL

## 2016-09-16 MED ORDER — LIDOCAINE 2% (20 MG/ML) 5 ML SYRINGE
INTRAMUSCULAR | Status: AC
  Filled 2016-09-16: qty 5

## 2016-09-16 SURGICAL SUPPLY — 67 items
ADH SKN CLS APL DERMABOND .7 (GAUZE/BANDAGES/DRESSINGS)
BAG DECANTER FOR FLEXI CONT (MISCELLANEOUS) ×4 IMPLANT
BINDER BREAST LRG (GAUZE/BANDAGES/DRESSINGS) IMPLANT
BINDER BREAST MEDIUM (GAUZE/BANDAGES/DRESSINGS) IMPLANT
BINDER BREAST XLRG (GAUZE/BANDAGES/DRESSINGS) IMPLANT
BINDER BREAST XXLRG (GAUZE/BANDAGES/DRESSINGS) IMPLANT
BIOPATCH RED 1 DISK 7.0 (GAUZE/BANDAGES/DRESSINGS) IMPLANT
BIOPATCH RED 1IN DISK 7.0MM (GAUZE/BANDAGES/DRESSINGS)
BLADE HEX COATED 2.75 (ELECTRODE) ×4 IMPLANT
BLADE SURG 15 STRL LF DISP TIS (BLADE) ×4 IMPLANT
BLADE SURG 15 STRL SS (BLADE) ×8
BNDG GAUZE ELAST 4 BULKY (GAUZE/BANDAGES/DRESSINGS) ×8 IMPLANT
CANISTER SUCT 1200ML W/VALVE (MISCELLANEOUS) ×4 IMPLANT
CHLORAPREP W/TINT 26ML (MISCELLANEOUS) ×4 IMPLANT
CORDS BIPOLAR (ELECTRODE) IMPLANT
COVER BACK TABLE 60X90IN (DRAPES) ×4 IMPLANT
COVER MAYO STAND STRL (DRAPES) ×4 IMPLANT
DECANTER SPIKE VIAL GLASS SM (MISCELLANEOUS) IMPLANT
DERMABOND ADVANCED (GAUZE/BANDAGES/DRESSINGS)
DERMABOND ADVANCED .7 DNX12 (GAUZE/BANDAGES/DRESSINGS) IMPLANT
DRAIN CHANNEL 19F RND (DRAIN) IMPLANT
DRAPE LAPAROSCOPIC ABDOMINAL (DRAPES) ×4 IMPLANT
DRSG PAD ABDOMINAL 8X10 ST (GAUZE/BANDAGES/DRESSINGS) ×8 IMPLANT
ELECT BLADE 4.0 EZ CLEAN MEGAD (MISCELLANEOUS) ×4
ELECT REM PT RETURN 9FT ADLT (ELECTROSURGICAL) ×4
ELECTRODE BLDE 4.0 EZ CLN MEGD (MISCELLANEOUS) ×2 IMPLANT
ELECTRODE REM PT RTRN 9FT ADLT (ELECTROSURGICAL) ×2 IMPLANT
EVACUATOR SILICONE 100CC (DRAIN) IMPLANT
GLOVE BIO SURGEON STRL SZ 6.5 (GLOVE) ×6 IMPLANT
GLOVE BIO SURGEONS STRL SZ 6.5 (GLOVE) ×2
GLOVE SURG SS PI 7.0 STRL IVOR (GLOVE) ×3 IMPLANT
GOWN STRL REUS W/ TWL LRG LVL3 (GOWN DISPOSABLE) ×4 IMPLANT
GOWN STRL REUS W/TWL LRG LVL3 (GOWN DISPOSABLE) ×8
IMPL GEL HI PROFILE 375CC (Breast) IMPLANT
IMPLANT GEL HI PROFILE 375CC (Breast) ×8 IMPLANT
IV NS 1000ML (IV SOLUTION)
IV NS 1000ML BAXH (IV SOLUTION) IMPLANT
IV NS 500ML (IV SOLUTION)
IV NS 500ML BAXH (IV SOLUTION) IMPLANT
KIT FILL SYSTEM UNIVERSAL (SET/KITS/TRAYS/PACK) IMPLANT
NDL HYPO 25X1 1.5 SAFETY (NEEDLE) IMPLANT
NDL SAFETY ECLIPSE 18X1.5 (NEEDLE) ×2 IMPLANT
NEEDLE HYPO 18GX1.5 SHARP (NEEDLE) ×4
NEEDLE HYPO 25X1 1.5 SAFETY (NEEDLE) IMPLANT
PACK BASIN DAY SURGERY FS (CUSTOM PROCEDURE TRAY) ×4 IMPLANT
PENCIL BUTTON HOLSTER BLD 10FT (ELECTRODE) ×4 IMPLANT
PIN SAFETY STERILE (MISCELLANEOUS) IMPLANT
SIZER BREAST REUSE 375CC (SIZER) ×4
SIZER BRST REUSE P4.8 12 375CC (SIZER) IMPLANT
SLEEVE SCD COMPRESS KNEE MED (MISCELLANEOUS) ×4 IMPLANT
SPONGE GAUZE 4X4 12PLY STER LF (GAUZE/BANDAGES/DRESSINGS) IMPLANT
SPONGE LAP 18X18 X RAY DECT (DISPOSABLE) ×8 IMPLANT
SUT MNCRL AB 4-0 PS2 18 (SUTURE) ×4 IMPLANT
SUT MON AB 3-0 SH 27 (SUTURE) ×4
SUT MON AB 3-0 SH27 (SUTURE) ×2 IMPLANT
SUT MON AB 5-0 PS2 18 (SUTURE) ×4 IMPLANT
SUT PDS AB 2-0 CT2 27 (SUTURE) IMPLANT
SUT VIC AB 3-0 SH 27 (SUTURE)
SUT VIC AB 3-0 SH 27X BRD (SUTURE) IMPLANT
SUT VICRYL 4-0 PS2 18IN ABS (SUTURE) ×2 IMPLANT
SYR BULB IRRIGATION 50ML (SYRINGE) ×4 IMPLANT
SYR CONTROL 10ML LL (SYRINGE) IMPLANT
TOWEL OR 17X24 6PK STRL BLUE (TOWEL DISPOSABLE) ×8 IMPLANT
TUBE CONNECTING 20'X1/4 (TUBING) ×1
TUBE CONNECTING 20X1/4 (TUBING) ×3 IMPLANT
UNDERPAD 30X30 (UNDERPADS AND DIAPERS) ×8 IMPLANT
YANKAUER SUCT BULB TIP NO VENT (SUCTIONS) ×4 IMPLANT

## 2016-09-16 NOTE — Interval H&P Note (Signed)
History and Physical Interval Note:  09/16/2016 7:11 AM  Kiara Spencer  has presented today for surgery, with the diagnosis of RIGHT BREAST CANCER  The various methods of treatment have been discussed with the patient and family. After consideration of risks, benefits and other options for treatment, the patient has consented to  Procedure(s): REMOVAL OF BILATERAL TISSUE EXPANDERS WITH PLACEMENT OF BILATERAL BREAST IMPLANTS (Bilateral) as a surgical intervention .  The patient's history has been reviewed, patient examined, no change in status, stable for surgery.  I have reviewed the patient's chart and labs.  Questions were answered to the patient's satisfaction.  Patient is also wanting the porta-a-cath to be removed.    Wallace Going

## 2016-09-16 NOTE — Anesthesia Procedure Notes (Signed)
Procedure Name: Intubation Date/Time: 09/16/2016 7:35 AM Performed by: Lyndee Leo Pre-anesthesia Checklist: Patient identified, Emergency Drugs available, Suction available and Patient being monitored Patient Re-evaluated:Patient Re-evaluated prior to inductionOxygen Delivery Method: Circle system utilized Preoxygenation: Pre-oxygenation with 100% oxygen Intubation Type: IV induction Ventilation: Mask ventilation without difficulty Laryngoscope Size: Mac and 3 Grade View: Grade III Tube type: Oral Number of attempts: 2 Airway Equipment and Method: Stylet and Oral airway Placement Confirmation: ETT inserted through vocal cords under direct vision,  positive ETCO2 and breath sounds checked- equal and bilateral Secured at: 21 cm Tube secured with: Tape Dental Injury: Teeth and Oropharynx as per pre-operative assessment  Difficulty Due To: Difficult Airway- due to anterior larynx and Difficult Airway- due to limited oral opening Future Recommendations: Recommend- induction with short-acting agent, and alternative techniques readily available

## 2016-09-16 NOTE — Interval H&P Note (Signed)
History and Physical Interval Note:  09/16/2016 7:11 AM  Kiara Spencer  has presented today for surgery, with the diagnosis of RIGHT BREAST CANCER  The various methods of treatment have been discussed with the patient and family. After consideration of risks, benefits and other options for treatment, the patient has consented to  Procedure(s): REMOVAL OF BILATERAL TISSUE EXPANDERS WITH PLACEMENT OF BILATERAL BREAST IMPLANTS (Bilateral) as a surgical intervention .  The patient's history has been reviewed, patient examined, no change in status, stable for surgery.  I have reviewed the patient's chart and labs.  Questions were answered to the patient's satisfaction.     Wallace Going

## 2016-09-16 NOTE — Discharge Instructions (Addendum)
May shower tomorrow °No heavy lifting °Continue binder or sports bra ° ° ° ° °Post Anesthesia Home Care Instructions ° °Activity: °Get plenty of rest for the remainder of the day. A responsible adult should stay with you for 24 hours following the procedure.  °For the next 24 hours, DO NOT: °-Drive a car °-Operate machinery °-Drink alcoholic beverages °-Take any medication unless instructed by your physician °-Make any legal decisions or sign important papers. ° °Meals: °Start with liquid foods such as gelatin or soup. Progress to regular foods as tolerated. Avoid greasy, spicy, heavy foods. If nausea and/or vomiting occur, drink only clear liquids until the nausea and/or vomiting subsides. Call your physician if vomiting continues. ° °Special Instructions/Symptoms: °Your throat may feel dry or sore from the anesthesia or the breathing tube placed in your throat during surgery. If this causes discomfort, gargle with warm salt water. The discomfort should disappear within 24 hours. ° °If you had a scopolamine patch placed behind your ear for the management of post- operative nausea and/or vomiting: ° °1. The medication in the patch is effective for 72 hours, after which it should be removed.  Wrap patch in a tissue and discard in the trash. Wash hands thoroughly with soap and water. °2. You may remove the patch earlier than 72 hours if you experience unpleasant side effects which may include dry mouth, dizziness or visual disturbances. °3. Avoid touching the patch. Wash your hands with soap and water after contact with the patch. °  ° °

## 2016-09-16 NOTE — H&P (View-Only) (Signed)
Kiara Spencer is an 45 y.o. female.   Chief Complaint: bilateral acquired absence of breasts HPI: The patient is a 45 yrs old bf here for pre operative history and physical prior to exchange surgery with removal of bilateral TE and placement of silicone implants. 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 Radiation is planned following exchange surgery.  She would also like her port removed at the time of exchange surgery.  Expanders have a  total of 360/350 cc.  History: She felt a mass in the right breast area. 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 She had genetic counseling and it was negative. She is seeing Drs. Moody and [place]. She has two children and breast feed both for ~ 1 year. She is 5 feet 1 inch tall, weighs 150 pounds and preop bra= 36B. She would like to be a C cup.  She is not a smoker.  Past Medical History:  Diagnosis Date  . Breast cancer ([place])   . Breast cancer of upper-outer quadrant of right female breast ([place]) 12/02/2015  . Headache    hx migraines    Past Surgical History:  Procedure Laterality Date  . BREAST RECONSTRUCTION WITH PLACEMENT OF TISSUE EXPANDER AND FLEX HD (ACELLULAR HYDRATED DERMIS) Bilateral 02/05/2016   Procedure: BILATERAL BREAST RECONSTRUCTION WITH PLACEMENT OF TISSUE EXPANDER AND FLEX HD (ACELLULAR HYDRATED DERMIS);  Surgeon: Loel Lofty Dillingham, DO;  Location: [facility];  Service: Plastics;  Laterality: Bilateral;  . CESAREAN SECTION  x 2  . DILATION AND CURETTAGE OF UTERUS    . MASTECTOMY W/ SENTINEL NODE BIOPSY Right 02/05/2016   Procedure: RIGHT NIPPLE SPARING MASTECTOMY WITH RIGHT AXILLARY SENTINEL LYMPH NODE BIOPSY;  Surgeon: [name], MD;  Location: [facility];  Service: General;  Laterality: Right;  . MASTECTOMY, PARTIAL Left 02/05/2016   Procedure: LEFT PROPHYLACTIC NIPPLE  SPARING MASTECTOMY ;  Surgeon: [name], MD;  Location: [facility];  Service: General;  Laterality: Left;  . PORTACATH PLACEMENT Right 03/02/2016   Procedure: INSERTION PORT-A-CATH WITH ULTRASOUND;  Surgeon: [name], MD;  Location: [facility];  Service: General;  Laterality: Right;  IJ    Family History  Problem Relation Age of Onset  . Breast cancer Mother 29    s/p partial mastectomy  . Colon polyps Mother     3 polyps total  . Breast cancer Maternal Aunt 64  . Leukemia Maternal Uncle 11  . Heart attack Paternal Aunt   . Heart attack Maternal Grandmother   . Heart attack Maternal Grandfather   . Lupus Maternal Aunt   . Prostate cancer Maternal Uncle 68  . Prostate cancer Maternal Uncle 72  . Prostate cancer Maternal Uncle   . Heart attack Maternal Uncle   . Prostate cancer Maternal Uncle     dx. 10s   . Breast cancer Cousin     dx. early 64s or younger; may have had genetic testing  . Cancer Cousin     "rare" cancer, dx. 2s  . Pancreatic cancer Cousin     dx. 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 Throat cancer Paternal Uncle     dx. early 82s  . Cancer Other     unspecified type   Social History:  reports that she has never smoked. She has  never used smokeless tobacco. She reports that she does not drink alcohol or use drugs.  Allergies:  Allergies  Allergen Reactions  . Cephalexin Rash     (Not in a hospital admission)  No results found for this or any previous visit (from the past 48 hour(s)). No results found.  Review of Systems  Constitutional: Negative.   HENT: Negative.   Eyes: Negative.   Respiratory: Negative.   Cardiovascular: Negative.   Gastrointestinal: Negative.   Genitourinary: Negative.   Skin: Negative.   Neurological: Negative.   Psychiatric/Behavioral: Negative.     There were no vitals taken for this visit. Physical Exam  Constitutional: She is oriented to person,  place, and time. She appears well-developed and well-nourished.  HENT:  Head: Normocephalic and atraumatic.  Eyes: Conjunctivae are normal. Pupils are equal, round, and reactive to light.  Cardiovascular: Normal rate.   Respiratory: Effort normal. No respiratory distress.  GI: She exhibits no distension.  Neurological: She is alert and oriented to person, place, and time.  Skin: Skin is warm.  Psychiatric: She has a normal mood and affect. Her behavior is normal. Judgment normal.     Assessment/Plan Bilateral breast reconstruction with removal of breast expanders and placement of silicone implants.  Wallace Going, DO 09/10/2016, 1:44 PM

## 2016-09-16 NOTE — Op Note (Signed)
Op report Bilateral Exchange   DATE OF OPERATION: 09/16/2016  LOCATION: [facility]  SURGICAL DIVISION: Plastic Surgery  PREOPERATIVE DIAGNOSES:  1.History of right breast cancer.  2. Acquired absence of bilateral breast.  3. Indwelling Port-a-cath.   POSTOPERATIVE DIAGNOSES:  1. History of right breast cancer.  2. Acquired absence of bilateral breast.  3. Indwelling port-a-cath.  PROCEDURE:  1. Bilateral exchange of tissue expanders for implants.  2. Right Breast capsulotomies for implant repositioning. 3. Left breast capsulectomies for implant repositioning and capsule release 4. Removal of port-a-cath  SURGEON: Claire Sanger Dillingham, DO  ASSISTANT: Shawn Rayburn, PA  ANESTHESIA:  General.   COMPLICATIONS: None.   IMPLANTS: Left - Mentor Smooth Round High Profile Gel 375cc. Ref UN:[id number].  Serial Number [id number] - Mentor Smooth Round High Profile Gel 375cc. Ref UN:[id number].  Serial Number [id number]  INDICATIONS FOR PROCEDURE:  The patient, Kiara Spencer, is a 45 y.o. female born on February 06, 1971, is here for treatment after bilateral mastectomies.  She had tissue expanders placed at the time of mastectomies. She now presents for exchange of her expanders for implants.  She requires capsulotomies to better position the implants. MRN: [id number]  CONSENT:  Informed consent was obtained directly from the patient. Risks, benefits and alternatives were fully discussed. Specific risks including but not limited to bleeding, infection, hematoma, seroma, scarring, pain, implant infection, implant extrusion, capsular contracture, asymmetry, wound healing problems, and need for further surgery were all discussed. The patient did have an ample opportunity to have her questions answered to her satisfaction.   DESCRIPTION OF PROCEDURE:  The patient was taken to the operating room. SCDs were placed and IV antibiotics were given. The patient's chest  was prepped and draped in a sterile fashion. A time out was performed and the implants to be used were identified.    Right breast: One percent Lidocaine with epinephrine was used to infiltrate at the incision site. The old mastectomy scar was excised.  The mastectomy flaps from the superior and inferior flaps were raised over the pectoralis major muscle for several centimeters to minimize tension for the closure. The pectoralis was split inferior to the skin incision to expose and remove the tissue expander.  Inspection of the pocket showed a normal healthy capsule and good integration of the biologic matrix.  The pocket was irrigated with antibiotic solution.  Circumferential capsulotomies were performed to allow for breast pocket expansion.  Measurements were made and a sizer used to confirm adequate pocket size for the implant dimensions.  Hemostasis was ensured with electrocautery. New gloves were placed. The implant was soaked in antibiotic solution and then placed in the pocket and oriented appropriately. The pectoralis major muscle and capsule on the anterior surface were re-closed with a 3-0 Monocryl suture. The remaining skin was closed with 4-0 Monocryl deep dermal and 5-0 Monocryl subcuticular stitches.   Left breast: The old mastectomy scar was excised.  The mastectomy flaps from the superior and inferior flaps were raised over the pectoralis major muscle for several centimeters to minimize tension for the closure. The pectoralis was split inferior to the skin incision to expose and remove the tissue expander.  Inspection of the pocket showed a normal healthy capsule and good integration of the biologic matrix.   Circumferential capsulotomies were performed to allow for breast pocket expansion.  Additional capsulectomies were done to try to release the superior lateral positioning of the nipple areola and release the capsule contracture.  Measurements were made  and a sizer utilized to confirm  adequate pocket size for the implant dimensions.  Hemostasis was ensured with the electrocautery.  New gloves were applied. The implant was soaked in antibiotic solution and placed in the pocket and oriented appropriately. The pectoralis major muscle and capsule on the anterior surface were re-closed with a 3-0 Monocryl suture. The remaining skin was closed with 4-0 Monocryl deep dermal and 5-0 Monocryl subcuticular stitches.    The #15 blade was used to make an incision over the place of the port-a-cath at the previous incision site.  The port was visualized.  The sutures were cut and removed from where they were placed to keep the port in place.  The 4-0 Vicryl suture was placed in a cerclage type incision at the entry of the catheter. As the catheter was withdrawn the vicryl was tightened. The deep layers were closed with 3-0 Monocryl followed by 4-0 and 5-0 Monocryl for the skin.  Dermabond was applied to the incision sites. A breast binder and ABDs were placed.  The patient was allowed to wake from anesthesia and taken to the recovery room in satisfactory condition.

## 2016-09-16 NOTE — Brief Op Note (Signed)
09/16/2016  7:26 AM  PATIENT:  Kiara Spencer  45 y.o. female  PRE-OPERATIVE DIAGNOSIS:  RIGHT BREAST CANCER  POST-OPERATIVE DIAGNOSIS:  same  PROCEDURE:  Procedure(s): REMOVAL OF BILATERAL TISSUE EXPANDERS WITH PLACEMENT OF BILATERAL BREAST IMPLANTS (Bilateral)  Removal of port-a-cath  SURGEON:  Surgeon(s) and Role:    * Loralyn Rachel S Tonia Avino, DO - Primary  PHYSICIAN ASSISTANT: Shawn Rayburn, PA  ASSISTANTS: none   ANESTHESIA:   local and general  EBL:  No intake/output data recorded.  BLOOD ADMINISTERED:none  DRAINS: none   LOCAL MEDICATIONS USED:  MARCAINE     SPECIMEN:  none  DISPOSITION OF SPECIMEN:  N/A  COUNTS:  YES  TOURNIQUET:  * No tourniquets in log *  DICTATION: .Dragon Dictation  PLAN OF CARE: Discharge to home after PACU  PATIENT DISPOSITION:  PACU - hemodynamically stable.   Delay start of Pharmacological VTE agent (>24hrs) due to surgical blood loss or risk of bleeding: no

## 2016-09-16 NOTE — Anesthesia Preprocedure Evaluation (Addendum)
Anesthesia Evaluation  Patient identified by MRN, date of birth, ID band Patient awake    Reviewed: Allergy & Precautions, NPO status , Patient's Chart, lab work & pertinent test results  History of Anesthesia Complications Negative for: history of anesthetic complications  Airway Mallampati: I  TM Distance: >3 FB Neck ROM: Full    Dental  (+) Teeth Intact   Pulmonary neg pulmonary ROS,    breath sounds clear to auscultation       Cardiovascular negative cardio ROS   Rhythm:Regular Rate:Normal     Neuro/Psych negative neurological ROS  negative psych ROS   GI/Hepatic negative GI ROS, Neg liver ROS,   Endo/Other  negative endocrine ROS  Renal/GU negative Renal ROS  negative genitourinary   Musculoskeletal negative musculoskeletal ROS (+)   Abdominal   Peds negative pediatric ROS (+)  Hematology negative hematology ROS (+)   Anesthesia Other Findings   Reproductive/Obstetrics negative OB ROS                             Anesthesia Physical  Anesthesia Plan  ASA: I  Anesthesia Plan: General   Post-op Pain Management:    Induction: Intravenous  Airway Management Planned: Oral ETT  Additional Equipment:   Intra-op Plan:   Post-operative Plan: Extubation in OR  Informed Consent: I have reviewed the patients History and Physical, chart, labs and discussed the procedure including the risks, benefits and alternatives for the proposed anesthesia with the patient or authorized representative who has indicated his/her understanding and acceptance.   Dental advisory given  Plan Discussed with: CRNA and Surgeon  Anesthesia Plan Comments:         Anesthesia Quick Evaluation

## 2016-09-16 NOTE — Transfer of Care (Signed)
Immediate Anesthesia Transfer of Care Note  Patient: [facility]  Procedure(s) Performed: Procedure(s): REMOVAL OF BILATERAL TISSUE EXPANDERS WITH PLACEMENT OF BILATERAL BREAST IMPLANTS (Bilateral) PORTA CATH REMOVAL (Right)  Patient Location: PACU  Anesthesia Type:General  Level of Consciousness: awake, sedated and patient cooperative  Airway & Oxygen Therapy: Patient Spontanous Breathing and Patient connected to face mask oxygen  Post-op Assessment: Report given to RN and Post -op Vital signs reviewed and stable  Post vital signs: Reviewed and stable  Last Vitals:  Vitals:   09/16/16 0653  BP: 95/60  Pulse: 96  Resp: 16  Temp: 36.6 C    Last Pain:  Vitals:   09/16/16 0653  TempSrc: Oral         Complications: No apparent anesthesia complications

## 2016-09-16 NOTE — Anesthesia Postprocedure Evaluation (Signed)
Anesthesia Post Note  Patient: Linda Hedges  Procedure(s) Performed: Procedure(s) (LRB): REMOVAL OF BILATERAL TISSUE EXPANDERS WITH PLACEMENT OF BILATERAL BREAST IMPLANTS (Bilateral) PORTA CATH REMOVAL (Right)  Patient location during evaluation: PACU Anesthesia Type: General Level of consciousness: awake and alert Pain management: pain level controlled Vital Signs Assessment: post-procedure vital signs reviewed and stable Respiratory status: spontaneous breathing, nonlabored ventilation and respiratory function stable Cardiovascular status: blood pressure returned to baseline and stable Postop Assessment: no signs of nausea or vomiting Anesthetic complications: no    Last Vitals:  Vitals:   09/16/16 1015 09/16/16 1045  BP: 117/76 110/74  Pulse: 62 71  Resp: 11 12  Temp:  36.8 C    Last Pain:  Vitals:   09/16/16 1045  TempSrc:   PainSc: 5                  Marquett Bertoli A

## 2016-09-17 ENCOUNTER — Encounter (HOSPITAL_BASED_OUTPATIENT_CLINIC_OR_DEPARTMENT_OTHER): Payer: Self-pay | Admitting: Plastic Surgery

## 2016-09-29 ENCOUNTER — Encounter (INDEPENDENT_AMBULATORY_CARE_PROVIDER_SITE_OTHER): Payer: Self-pay

## 2016-09-29 ENCOUNTER — Encounter: Payer: Self-pay | Admitting: Radiation Oncology

## 2016-09-29 ENCOUNTER — Ambulatory Visit
Admission: RE | Admit: 2016-09-29 | Discharge: 2016-09-29 | Disposition: A | Discharge status: Home / Self Care | Payer: BLUE CROSS/BLUE SHIELD | Source: Ambulatory Visit | Attending: Radiation Oncology | Admitting: Radiation Oncology

## 2016-09-29 VITALS — BP 101/72 | HR 75 | Temp 96.4°F | Wt 148.1 lb

## 2016-09-29 DIAGNOSIS — C50811 Malignant neoplasm of overlapping sites of right female breast: Secondary | ICD-10-CM | POA: Insufficient documentation

## 2016-09-29 DIAGNOSIS — Z170 Estrogen receptor positive status [ER+]: Secondary | ICD-10-CM | POA: Insufficient documentation

## 2016-09-29 DIAGNOSIS — Z9013 Acquired absence of bilateral breasts and nipples: Secondary | ICD-10-CM | POA: Diagnosis not present

## 2016-09-29 DIAGNOSIS — C50411 Malignant neoplasm of upper-outer quadrant of right female breast: Secondary | ICD-10-CM

## 2016-09-29 NOTE — Progress Notes (Signed)
Radiation Oncology Follow up Note  Name: Kiara Spencer   Date:   09/29/2016 MRN:  [id number] DOB: [birth year]    This 45 y.o. female presents to the clinic today for initiation radiation therapy for stage IIB (T2 N1 M0) ER/PR positive HER-2/neu negative invasive mammary carcinoma status post bilateral mastectomies adjuvant chemotherapy and breast reconstruction.[name]  REFERRING PROVIDER: Brien Few, MD  HPI: Patient is a 45 year old female initially consult back in August 2017 for stage IIB (T2 N1 M0) ER/PR positive HER-2/neu negative invasive mammary carcinoma. Of the right breast. 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 The problem was her posterior margin axillary tail was focally positive. One sentinel lymph node was positive for metastatic disease to the lymph nodes were negative. There was diffuse lymphovascular invasion present. MammaPrint showed high risk recurrence and she underwent Cytoxan and Adriamycin followed by a Abraxane. She is completed bilateral reconstruction of her breasts initially with tissue expanders and then full capsular implants. She is seen today to commence the radiation therapy portion of her treatment plan. She is doing well. She specifically denies breast tenderness cough or bone pain. y  COMPLICATIONS OF TREATMENT: none  FOLLOW UP COMPLIANCE: keeps appointments   PHYSICAL EXAM:  BP 101/72   Pulse 75   Temp (!) 96.4 F (35.8 C)   Wt 148 lb 2.4 oz (67.2 kg)   BMI 27.99 kg/m  Patient is status post bilateral breast reconstruction. No dominant mass or nodularity in the chest wall on either side is noted. No axillary or supraclavicular adenopathy is noted. No lymphedema of the right upper extremity is noted. Well-developed well-nourished patient in NAD. HEENT reveals PERLA, EOMI, discs not visualized.  Oral cavity is clear. No oral mucosal lesions are  identified. Neck is clear without evidence of cervical or supraclavicular adenopathy. Lungs are clear to A&P. Cardiac examination is essentially unremarkable with regular rate and rhythm without murmur rub or thrill. Abdomen is benign with no organomegaly or masses noted. Motor sensory and DTR levels are equal and symmetric in the upper and lower extremities. Cranial nerves II through XII are grossly intact. Proprioception is intact. No peripheral adenopathy or edema is identified. No motor or sensory levels are noted. Crude visual fields are within normal range.  RADIOLOGY RESULTS: No current films for review  PLAN: At the present time I to go ahead with right-sided breast and peripheral lymphatic radiation to 5040 cGy in 28 fractions. I would also boost her axillary tail another 1600 cGy using electron beam. This is based on her positive focal margin. Risks and benefits of treatment including skin reaction alteration of blood counts possible inclusion of superficial lung fatigue and risk of lymphedema all were discussed in detail with the patient. I've emphasized like her to use her right upper extremity is much is possible to prevent lymphedema. I personally set up and ordered CT simulation on the patient for early next week.There will be extra effort by both professional staff as well as technical staff to coordinate and manage concurrent chemoradiation and ensuing side effects during her treatments. Patient is to call with any concerns.  I would like to take this opportunity to thank you for allowing me to participate in the care of your patient.Armstead Peaks., MD

## 2016-10-04 ENCOUNTER — Ambulatory Visit
Admission: RE | Admit: 2016-10-04 | Discharge: 2016-10-04 | Disposition: A | Discharge status: Home / Self Care | Payer: BLUE CROSS/BLUE SHIELD | Source: Ambulatory Visit | Attending: Radiation Oncology | Admitting: Radiation Oncology

## 2016-10-04 DIAGNOSIS — C50811 Malignant neoplasm of overlapping sites of right female breast: Secondary | ICD-10-CM | POA: Diagnosis not present

## 2016-10-07 DIAGNOSIS — C50811 Malignant neoplasm of overlapping sites of right female breast: Secondary | ICD-10-CM | POA: Diagnosis not present

## 2016-10-08 ENCOUNTER — Other Ambulatory Visit: Payer: Self-pay | Admitting: *Deleted

## 2016-10-08 DIAGNOSIS — C50411 Malignant neoplasm of upper-outer quadrant of right female breast: Secondary | ICD-10-CM

## 2016-10-11 ENCOUNTER — Ambulatory Visit
Admission: RE | Admit: 2016-10-11 | Discharge: 2016-10-11 | Disposition: A | Discharge status: Home / Self Care | Payer: BLUE CROSS/BLUE SHIELD | Source: Ambulatory Visit | Attending: Radiation Oncology | Admitting: Radiation Oncology

## 2016-10-11 DIAGNOSIS — C50811 Malignant neoplasm of overlapping sites of right female breast: Secondary | ICD-10-CM | POA: Diagnosis not present

## 2016-10-12 ENCOUNTER — Ambulatory Visit
Admission: RE | Admit: 2016-10-12 | Discharge: 2016-10-12 | Disposition: A | Discharge status: Home / Self Care | Payer: BLUE CROSS/BLUE SHIELD | Source: Ambulatory Visit | Attending: Radiation Oncology | Admitting: Radiation Oncology

## 2016-10-12 DIAGNOSIS — C50811 Malignant neoplasm of overlapping sites of right female breast: Secondary | ICD-10-CM | POA: Diagnosis not present

## 2016-10-13 ENCOUNTER — Ambulatory Visit
Admission: RE | Admit: 2016-10-13 | Discharge: 2016-10-13 | Disposition: A | Discharge status: Home / Self Care | Payer: BLUE CROSS/BLUE SHIELD | Source: Ambulatory Visit | Attending: Radiation Oncology | Admitting: Radiation Oncology

## 2016-10-13 DIAGNOSIS — C50811 Malignant neoplasm of overlapping sites of right female breast: Secondary | ICD-10-CM | POA: Diagnosis not present

## 2016-10-14 ENCOUNTER — Ambulatory Visit
Admission: RE | Admit: 2016-10-14 | Discharge: 2016-10-14 | Disposition: A | Discharge status: Home / Self Care | Payer: BLUE CROSS/BLUE SHIELD | Source: Ambulatory Visit | Attending: Radiation Oncology | Admitting: Radiation Oncology

## 2016-10-14 DIAGNOSIS — C50811 Malignant neoplasm of overlapping sites of right female breast: Secondary | ICD-10-CM | POA: Diagnosis not present

## 2016-10-15 ENCOUNTER — Ambulatory Visit
Admission: RE | Admit: 2016-10-15 | Discharge: 2016-10-15 | Disposition: A | Discharge status: Home / Self Care | Payer: BLUE CROSS/BLUE SHIELD | Source: Ambulatory Visit | Attending: Radiation Oncology | Admitting: Radiation Oncology

## 2016-10-15 DIAGNOSIS — C50811 Malignant neoplasm of overlapping sites of right female breast: Secondary | ICD-10-CM | POA: Diagnosis not present

## 2016-10-18 ENCOUNTER — Ambulatory Visit
Admission: RE | Admit: 2016-10-18 | Discharge: 2016-10-18 | Disposition: A | Discharge status: Home / Self Care | Payer: BLUE CROSS/BLUE SHIELD | Source: Ambulatory Visit | Attending: Radiation Oncology | Admitting: Radiation Oncology

## 2016-10-18 DIAGNOSIS — C50811 Malignant neoplasm of overlapping sites of right female breast: Secondary | ICD-10-CM | POA: Diagnosis not present

## 2016-10-19 ENCOUNTER — Ambulatory Visit
Admission: RE | Admit: 2016-10-19 | Discharge: 2016-10-19 | Disposition: A | Discharge status: Home / Self Care | Payer: BLUE CROSS/BLUE SHIELD | Source: Ambulatory Visit | Attending: Radiation Oncology | Admitting: Radiation Oncology

## 2016-10-19 DIAGNOSIS — C50811 Malignant neoplasm of overlapping sites of right female breast: Secondary | ICD-10-CM | POA: Diagnosis not present

## 2016-10-20 ENCOUNTER — Ambulatory Visit
Admission: RE | Admit: 2016-10-20 | Discharge: 2016-10-20 | Disposition: A | Discharge status: Home / Self Care | Payer: BLUE CROSS/BLUE SHIELD | Source: Ambulatory Visit | Attending: Radiation Oncology | Admitting: Radiation Oncology

## 2016-10-20 DIAGNOSIS — C50811 Malignant neoplasm of overlapping sites of right female breast: Secondary | ICD-10-CM | POA: Diagnosis not present

## 2016-10-21 ENCOUNTER — Ambulatory Visit
Admission: RE | Admit: 2016-10-21 | Discharge: 2016-10-21 | Disposition: A | Discharge status: Home / Self Care | Payer: BLUE CROSS/BLUE SHIELD | Source: Ambulatory Visit | Attending: Radiation Oncology | Admitting: Radiation Oncology

## 2016-10-21 DIAGNOSIS — C50811 Malignant neoplasm of overlapping sites of right female breast: Secondary | ICD-10-CM | POA: Diagnosis not present

## 2016-10-22 ENCOUNTER — Ambulatory Visit
Admission: RE | Admit: 2016-10-22 | Discharge: 2016-10-22 | Disposition: A | Discharge status: Home / Self Care | Payer: BLUE CROSS/BLUE SHIELD | Source: Ambulatory Visit | Attending: Radiation Oncology | Admitting: Radiation Oncology

## 2016-10-22 DIAGNOSIS — C50811 Malignant neoplasm of overlapping sites of right female breast: Secondary | ICD-10-CM | POA: Diagnosis not present

## 2016-10-25 ENCOUNTER — Ambulatory Visit
Admission: RE | Admit: 2016-10-25 | Discharge: 2016-10-25 | Disposition: A | Discharge status: Home / Self Care | Payer: BLUE CROSS/BLUE SHIELD | Source: Ambulatory Visit | Attending: Radiation Oncology | Admitting: Radiation Oncology

## 2016-10-25 DIAGNOSIS — C50811 Malignant neoplasm of overlapping sites of right female breast: Secondary | ICD-10-CM | POA: Diagnosis not present

## 2016-10-26 ENCOUNTER — Ambulatory Visit
Admission: RE | Admit: 2016-10-26 | Discharge: 2016-10-26 | Disposition: A | Discharge status: Home / Self Care | Payer: BLUE CROSS/BLUE SHIELD | Source: Ambulatory Visit | Attending: Radiation Oncology | Admitting: Radiation Oncology

## 2016-10-26 DIAGNOSIS — C50811 Malignant neoplasm of overlapping sites of right female breast: Secondary | ICD-10-CM | POA: Diagnosis not present

## 2016-10-27 ENCOUNTER — Inpatient Hospital Stay: Payer: BLUE CROSS/BLUE SHIELD | Attending: Radiation Oncology

## 2016-10-27 ENCOUNTER — Ambulatory Visit
Admission: RE | Admit: 2016-10-27 | Discharge: 2016-10-27 | Disposition: A | Discharge status: Home / Self Care | Payer: BLUE CROSS/BLUE SHIELD | Source: Ambulatory Visit | Attending: Radiation Oncology | Admitting: Radiation Oncology

## 2016-10-27 DIAGNOSIS — C50811 Malignant neoplasm of overlapping sites of right female breast: Secondary | ICD-10-CM | POA: Diagnosis not present

## 2016-11-01 ENCOUNTER — Ambulatory Visit
Admission: RE | Admit: 2016-11-01 | Discharge: 2016-11-01 | Disposition: A | Discharge status: Home / Self Care | Payer: BLUE CROSS/BLUE SHIELD | Source: Ambulatory Visit | Attending: Radiation Oncology | Admitting: Radiation Oncology

## 2016-11-01 DIAGNOSIS — C50811 Malignant neoplasm of overlapping sites of right female breast: Secondary | ICD-10-CM | POA: Diagnosis not present

## 2016-11-02 ENCOUNTER — Ambulatory Visit
Admission: RE | Admit: 2016-11-02 | Discharge: 2016-11-02 | Disposition: A | Discharge status: Home / Self Care | Payer: BLUE CROSS/BLUE SHIELD | Source: Ambulatory Visit | Attending: Radiation Oncology | Admitting: Radiation Oncology

## 2016-11-02 DIAGNOSIS — C50811 Malignant neoplasm of overlapping sites of right female breast: Secondary | ICD-10-CM | POA: Diagnosis not present

## 2016-11-03 ENCOUNTER — Ambulatory Visit
Admission: RE | Admit: 2016-11-03 | Discharge: 2016-11-03 | Disposition: A | Discharge status: Home / Self Care | Payer: BLUE CROSS/BLUE SHIELD | Source: Ambulatory Visit | Attending: Radiation Oncology | Admitting: Radiation Oncology

## 2016-11-03 DIAGNOSIS — C50811 Malignant neoplasm of overlapping sites of right female breast: Secondary | ICD-10-CM | POA: Diagnosis not present

## 2016-11-04 ENCOUNTER — Ambulatory Visit
Admission: RE | Admit: 2016-11-04 | Discharge: 2016-11-04 | Disposition: A | Discharge status: Home / Self Care | Payer: BLUE CROSS/BLUE SHIELD | Source: Ambulatory Visit | Attending: Radiation Oncology | Admitting: Radiation Oncology

## 2016-11-04 DIAGNOSIS — C50811 Malignant neoplasm of overlapping sites of right female breast: Secondary | ICD-10-CM | POA: Diagnosis not present

## 2016-11-05 ENCOUNTER — Ambulatory Visit
Admission: RE | Admit: 2016-11-05 | Discharge: 2016-11-05 | Disposition: A | Discharge status: Home / Self Care | Payer: BLUE CROSS/BLUE SHIELD | Source: Ambulatory Visit | Attending: Radiation Oncology | Admitting: Radiation Oncology

## 2016-11-05 DIAGNOSIS — C50811 Malignant neoplasm of overlapping sites of right female breast: Secondary | ICD-10-CM | POA: Diagnosis not present

## 2016-11-08 ENCOUNTER — Ambulatory Visit
Admission: RE | Admit: 2016-11-08 | Discharge: 2016-11-08 | Disposition: A | Discharge status: Home / Self Care | Payer: BLUE CROSS/BLUE SHIELD | Source: Ambulatory Visit | Attending: Radiation Oncology | Admitting: Radiation Oncology

## 2016-11-08 DIAGNOSIS — C50811 Malignant neoplasm of overlapping sites of right female breast: Secondary | ICD-10-CM | POA: Diagnosis not present

## 2016-11-09 ENCOUNTER — Ambulatory Visit
Admission: RE | Admit: 2016-11-09 | Discharge: 2016-11-09 | Disposition: A | Discharge status: Home / Self Care | Payer: BLUE CROSS/BLUE SHIELD | Source: Ambulatory Visit | Attending: Radiation Oncology | Admitting: Radiation Oncology

## 2016-11-09 DIAGNOSIS — C50811 Malignant neoplasm of overlapping sites of right female breast: Secondary | ICD-10-CM | POA: Diagnosis not present

## 2016-11-10 ENCOUNTER — Ambulatory Visit: Admission: RE | Admit: 2016-11-10 | Payer: BLUE CROSS/BLUE SHIELD | Source: Ambulatory Visit

## 2016-11-10 ENCOUNTER — Inpatient Hospital Stay: Payer: BLUE CROSS/BLUE SHIELD | Attending: Radiation Oncology

## 2016-11-11 ENCOUNTER — Ambulatory Visit: Payer: BLUE CROSS/BLUE SHIELD

## 2016-11-12 ENCOUNTER — Ambulatory Visit: Payer: BLUE CROSS/BLUE SHIELD

## 2016-11-15 ENCOUNTER — Ambulatory Visit
Admission: RE | Admit: 2016-11-15 | Discharge: 2016-11-15 | Disposition: A | Discharge status: Home / Self Care | Payer: BLUE CROSS/BLUE SHIELD | Source: Ambulatory Visit | Attending: Radiation Oncology | Admitting: Radiation Oncology

## 2016-11-15 DIAGNOSIS — C50811 Malignant neoplasm of overlapping sites of right female breast: Secondary | ICD-10-CM | POA: Diagnosis not present

## 2016-11-16 ENCOUNTER — Ambulatory Visit
Admission: RE | Admit: 2016-11-16 | Discharge: 2016-11-16 | Disposition: A | Discharge status: Home / Self Care | Payer: BLUE CROSS/BLUE SHIELD | Source: Ambulatory Visit | Attending: Radiation Oncology | Admitting: Radiation Oncology

## 2016-11-16 DIAGNOSIS — C50811 Malignant neoplasm of overlapping sites of right female breast: Secondary | ICD-10-CM | POA: Diagnosis not present

## 2016-11-17 ENCOUNTER — Ambulatory Visit
Admission: RE | Admit: 2016-11-17 | Discharge: 2016-11-17 | Disposition: A | Discharge status: Home / Self Care | Payer: BLUE CROSS/BLUE SHIELD | Source: Ambulatory Visit | Attending: Radiation Oncology | Admitting: Radiation Oncology

## 2016-11-17 DIAGNOSIS — C50811 Malignant neoplasm of overlapping sites of right female breast: Secondary | ICD-10-CM | POA: Diagnosis not present

## 2016-11-18 ENCOUNTER — Ambulatory Visit
Admission: RE | Admit: 2016-11-18 | Discharge: 2016-11-18 | Disposition: A | Discharge status: Home / Self Care | Payer: BLUE CROSS/BLUE SHIELD | Source: Ambulatory Visit | Attending: Radiation Oncology | Admitting: Radiation Oncology

## 2016-11-18 DIAGNOSIS — C50811 Malignant neoplasm of overlapping sites of right female breast: Secondary | ICD-10-CM | POA: Diagnosis not present

## 2016-11-19 ENCOUNTER — Ambulatory Visit
Admission: RE | Admit: 2016-11-19 | Discharge: 2016-11-19 | Disposition: A | Discharge status: Home / Self Care | Payer: BLUE CROSS/BLUE SHIELD | Source: Ambulatory Visit | Attending: Radiation Oncology | Admitting: Radiation Oncology

## 2016-11-19 DIAGNOSIS — C50811 Malignant neoplasm of overlapping sites of right female breast: Secondary | ICD-10-CM | POA: Diagnosis not present

## 2016-11-21 NOTE — Assessment & Plan Note (Deleted)
Rt. 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 T2N1 (Stage 2B); Left mastectomy: Fibrocystic ch. Mammaprint high risk  Treatment Summary: 1. Mammaprint: High risk, adjuvant chemotherapy with dose dense Adriamycin Cytoxan 4 followed by weekly Abraxane 12 from 03/15/16 to 07/26/16 2. Followed by radiation [facility] at [facility] 3. Followed by antiestrogen therapy.   Current treatment: Tamoxifen 20 mg daily X 10 years started 11/22/16 We discussed the risks and benefits of tamoxifen. These include but not limited to insomnia, hot flashes, mood changes, vaginal dryness, and weight gain. Although rare, serious side effects including endometrial cancer, risk of blood clots were also discussed. We strongly believe that the benefits far outweigh the risks. Patient understands these risks and consented to starting treatment. Planned treatment duration is 5 years.

## 2016-11-22 ENCOUNTER — Ambulatory Visit: Payer: BLUE CROSS/BLUE SHIELD | Admitting: Hematology and Oncology

## 2016-11-22 ENCOUNTER — Telehealth: Payer: Self-pay | Admitting: *Deleted

## 2016-11-22 ENCOUNTER — Ambulatory Visit
Admission: RE | Admit: 2016-11-22 | Discharge: 2016-11-22 | Disposition: A | Discharge status: Home / Self Care | Payer: BLUE CROSS/BLUE SHIELD | Source: Ambulatory Visit | Attending: Radiation Oncology | Admitting: Radiation Oncology

## 2016-11-22 ENCOUNTER — Other Ambulatory Visit: Payer: BLUE CROSS/BLUE SHIELD

## 2016-11-22 ENCOUNTER — Ambulatory Visit: Admission: RE | Admit: 2016-11-22 | Payer: BLUE CROSS/BLUE SHIELD | Source: Ambulatory Visit

## 2016-11-22 DIAGNOSIS — C50811 Malignant neoplasm of overlapping sites of right female breast: Secondary | ICD-10-CM | POA: Diagnosis not present

## 2016-11-22 NOTE — Progress Notes (Deleted)
Patient Care Team: Brien Few, MD as PCP - General (Obstetrics and Gynecology) [name], MD as Consulting Physician (General Surgery) Nicholas Lose, MD as Consulting Physician (Hematology and Oncology) Kyung Rudd, MD as Consulting Physician (Radiation Oncology) Sylvan Cheese, NP as Nurse Practitioner (Hematology and Oncology)  DIAGNOSIS:  Encounter Diagnosis  Name Primary?  . Malignant neoplasm of upper-outer quadrant of right breast in female, estrogen receptor positive ([place])     SUMMARY OF ONCOLOGIC HISTORY:   Breast cancer of upper-outer quadrant of right female breast ([place])   11/24/2015 Mammogram    2 suspicious groups of calcifications right breast UOQ posteriorly 2.2 x 2.3 x 2.5 cm (by U/S measured 1.2 x 0.8 x 1.2 cm); anteriorly 1.1 x 1.2 x 0.8 cm (not seen by ultrasound)      11/27/2015 Initial Diagnosis    Right breast biopsy UOQ Posterior: IDC with DCIS with, LVI present, ER 100%, PR 20%, Ki-67 30%, HER-2 negative; ANTERIOR: Complex sclerosing lesions with calcifications      02/05/2016 Surgery    Rt. Mastectomy: IDC Grade 1, 3 cm, 1/11 LN positive,with IG DCIS 0.1 cm margin, Diffuse LVI, cancer at axill tail. T2N1 (Stage 2B); Left mastectomy: Fibrocystic ch, Mammaprint high risk        03/15/2016 - 07/26/2016 Chemotherapy    Dose dense Adriamycin and Cytoxan 4 followed by Abraxane weekly 12       Radiation Therapy    Adjuvant radiation therapy at [facility]      11/22/2016 -  Anti-estrogen oral therapy    Tamoxifen 20 mg daily      CHIEF COMPLIANT: Follow-up after radiation to start antiestrogen therapy  INTERVAL HISTORY: Momoka Stringfield is a 45 year old with above-mentioned history of right breast cancer treated with mastectomy followed by adjuvant chemotherapy and radiation. She is currently here to discuss starting antiestrogen therapy. She was on a TV show recently with Dorian Pod degeneres and had a fabulous time there. She has recovered from  the effects of radiation.  REVIEW OF SYSTEMS:   Constitutional: Denies fevers, chills or abnormal weight loss Eyes: Denies blurriness of vision Ears, nose, mouth, throat, and face: Denies mucositis or sore throat Respiratory: Denies cough, dyspnea or wheezes Cardiovascular: Denies palpitation, chest discomfort Gastrointestinal:  Denies nausea, heartburn or change in bowel habits Skin: Denies abnormal skin rashes Lymphatics: Denies new lymphadenopathy or easy bruising Neurological:Denies numbness, tingling or new weaknesses Behavioral/Psych: Mood is stable, no new changes  Extremities: No lower extremity edema Breast:  denies any pain or lumps or nodules in either breasts All other systems were reviewed with the patient and are negative.  I have reviewed the past medical history, past surgical history, social history and family history with the patient and they are unchanged from previous note.  ALLERGIES:  is allergic to cephalexin and ciprofloxacin.  MEDICATIONS:  Current Outpatient Prescriptions  Medication Sig Dispense Refill  . Ascorbic Acid (VITAMIN C) 100 MG tablet Take 100 mg by mouth daily.    [name] aspirin-acetaminophen-caffeine (EXCEDRIN MIGRAINE) 250-250-65 MG tablet Take 2 tablets by mouth every 6 (six) hours as needed for headache.    . Biotin 10 MG CAPS Take by mouth.    . Biotin 10000 MCG TABS Take by mouth.    . ciprofloxacin (CIPRO) 500 MG tablet Take 500 mg by mouth.    [name] FLUVIRIN SUSP ADM 0.5ML IM UTD  0  . gabapentin (NEURONTIN) 100 MG capsule     . gabapentin (NEURONTIN) 300 MG capsule Take 1 capsule (  300 mg total) by mouth at bedtime. 90 capsule 3  . ondansetron (ZOFRAN-ODT) 4 MG disintegrating tablet     . oxyCODONE (OXY IR/ROXICODONE) 5 MG immediate release tablet Take 5 mg by mouth.     No current facility-administered medications for this visit.     PHYSICAL EXAMINATION: ECOG PERFORMANCE STATUS: 1 - Symptomatic but completely ambulatory  There were no  vitals filed for this visit. There were no vitals filed for this visit.  GENERAL:alert, no distress and comfortable SKIN: skin color, texture, turgor are normal, no rashes or significant lesions EYES: normal, Conjunctiva are pink and non-injected, sclera clear OROPHARYNX:no exudate, no erythema and lips, buccal mucosa, and tongue normal  NECK: supple, thyroid normal size, non-tender, without nodularity LYMPH:  no palpable lymphadenopathy in the cervical, axillary or inguinal LUNGS: clear to auscultation and percussion with normal breathing effort HEART: regular rate & rhythm and no murmurs and no lower extremity edema ABDOMEN:abdomen soft, non-tender and normal bowel sounds MUSCULOSKELETAL:no cyanosis of digits and no clubbing  NEURO: alert & oriented x 3 with fluent speech, no focal motor/sensory deficits EXTREMITIES: No lower extremity edema BREAST: No palpable masses or nodules in either right or left breasts. No palpable axillary supraclavicular or infraclavicular adenopathy no breast tenderness or nipple discharge. (exam performed in the presence of a chaperone)  LABORATORY DATA:  I have reviewed the data as listed   Chemistry      Component Value Date/Time   NA 143 07/26/2016 0924   K 4.6 07/26/2016 0924   CL 102 03/02/2016 1045   CO2 25 07/26/2016 0924   BUN 9.3 07/26/2016 0924   CREATININE 0.7 07/26/2016 0924      Component Value Date/Time   CALCIUM 9.4 07/26/2016 0924   ALKPHOS 51 07/26/2016 0924   AST 22 07/26/2016 0924   ALT 18 07/26/2016 0924   BILITOT 0.30 07/26/2016 0924       Lab Results  Component Value Date   WBC 2.6 (L) 07/26/2016   HGB 11.1 (L) 07/26/2016   HCT 34.1 (L) 07/26/2016   MCV 95.6 07/26/2016   PLT 306 07/26/2016   NEUTROABS 1.5 07/26/2016    ASSESSMENT & PLAN:  Breast cancer of upper-outer quadrant of right female breast ([place]) Rt. Mastectomy 02/05/16: IDC Grade 1, 3 cm, 1/11 LN positive,with IG DCIS 0.1 cm margin, Diffuse LVI, cancer at  axill tail. T2N1 (Stage 2B); Left mastectomy: Fibrocystic ch. Mammaprint high risk  Treatment Summary: 1. Mammaprint: High risk, adjuvant chemotherapy with dose dense Adriamycin Cytoxan 4 followed by weekly Abraxane 12  from 03/15/16 to 07/26/16 2. Followed by radiation therapy at [facility] 3. Followed by antiestrogen therapy.   Current treatment: Tamoxifen 20 mg daily X 10 years started 11/22/16 We discussed the risks and benefits of tamoxifen. These include but not limited to insomnia, hot flashes, mood changes, vaginal dryness, and weight gain. Although rare, serious side effects including endometrial cancer, risk of blood clots were also discussed. We strongly believe that the benefits far outweigh the risks. Patient understands these risks and consented to starting treatment. Planned treatment duration is 5 years.    No orders of the defined types were placed in this encounter.  The patient has a good understanding of the overall plan. she agrees with it. she will call with any problems that may develop before the next visit here.   Rulon Eisenmenger, MD 11/22/16

## 2016-11-22 NOTE — Telephone Encounter (Signed)
Received call from patient stating she was not aware of her appointment with Dr. Lindi Adie.  Rescheduled appt to 01/10/17 8am  after radiation complete.

## 2016-11-22 NOTE — Telephone Encounter (Signed)
Left message for a return phone call to follow up from her missed appointment today with Dr. Lindi Adie.

## 2016-11-23 ENCOUNTER — Ambulatory Visit
Admission: RE | Admit: 2016-11-23 | Discharge: 2016-11-23 | Disposition: A | Discharge status: Home / Self Care | Payer: BLUE CROSS/BLUE SHIELD | Source: Ambulatory Visit | Attending: Radiation Oncology | Admitting: Radiation Oncology

## 2016-11-23 DIAGNOSIS — C50811 Malignant neoplasm of overlapping sites of right female breast: Secondary | ICD-10-CM | POA: Diagnosis not present

## 2016-11-24 ENCOUNTER — Inpatient Hospital Stay: Payer: BLUE CROSS/BLUE SHIELD

## 2016-11-24 ENCOUNTER — Ambulatory Visit
Admission: RE | Admit: 2016-11-24 | Discharge: 2016-11-24 | Disposition: A | Discharge status: Home / Self Care | Payer: BLUE CROSS/BLUE SHIELD | Source: Ambulatory Visit | Attending: Radiation Oncology | Admitting: Radiation Oncology

## 2016-11-24 DIAGNOSIS — C50811 Malignant neoplasm of overlapping sites of right female breast: Secondary | ICD-10-CM | POA: Diagnosis not present

## 2016-11-25 ENCOUNTER — Ambulatory Visit
Admission: RE | Admit: 2016-11-25 | Discharge: 2016-11-25 | Disposition: A | Discharge status: Home / Self Care | Payer: BLUE CROSS/BLUE SHIELD | Source: Ambulatory Visit | Attending: Radiation Oncology | Admitting: Radiation Oncology

## 2016-11-25 DIAGNOSIS — C50811 Malignant neoplasm of overlapping sites of right female breast: Secondary | ICD-10-CM | POA: Diagnosis not present

## 2016-11-26 ENCOUNTER — Ambulatory Visit
Admission: RE | Admit: 2016-11-26 | Discharge: 2016-11-26 | Disposition: A | Discharge status: Home / Self Care | Payer: BLUE CROSS/BLUE SHIELD | Source: Ambulatory Visit | Attending: Radiation Oncology | Admitting: Radiation Oncology

## 2016-11-26 DIAGNOSIS — C50811 Malignant neoplasm of overlapping sites of right female breast: Secondary | ICD-10-CM | POA: Diagnosis not present

## 2016-11-30 ENCOUNTER — Ambulatory Visit
Admission: RE | Admit: 2016-11-30 | Discharge: 2016-11-30 | Disposition: A | Discharge status: Home / Self Care | Payer: BLUE CROSS/BLUE SHIELD | Source: Ambulatory Visit | Attending: Radiation Oncology | Admitting: Radiation Oncology

## 2016-11-30 DIAGNOSIS — C50811 Malignant neoplasm of overlapping sites of right female breast: Secondary | ICD-10-CM | POA: Diagnosis not present

## 2016-12-01 ENCOUNTER — Ambulatory Visit
Admission: RE | Admit: 2016-12-01 | Discharge: 2016-12-01 | Disposition: A | Discharge status: Home / Self Care | Payer: BLUE CROSS/BLUE SHIELD | Source: Ambulatory Visit | Attending: Radiation Oncology | Admitting: Radiation Oncology

## 2016-12-01 DIAGNOSIS — C50811 Malignant neoplasm of overlapping sites of right female breast: Secondary | ICD-10-CM | POA: Diagnosis not present

## 2016-12-02 ENCOUNTER — Ambulatory Visit
Admission: RE | Admit: 2016-12-02 | Discharge: 2016-12-02 | Disposition: A | Discharge status: Home / Self Care | Payer: BLUE CROSS/BLUE SHIELD | Source: Ambulatory Visit | Attending: Radiation Oncology | Admitting: Radiation Oncology

## 2016-12-02 DIAGNOSIS — C50811 Malignant neoplasm of overlapping sites of right female breast: Secondary | ICD-10-CM | POA: Diagnosis not present

## 2016-12-03 ENCOUNTER — Ambulatory Visit
Admission: RE | Admit: 2016-12-03 | Discharge: 2016-12-03 | Disposition: A | Discharge status: Home / Self Care | Payer: BLUE CROSS/BLUE SHIELD | Source: Ambulatory Visit | Attending: Radiation Oncology | Admitting: Radiation Oncology

## 2016-12-03 DIAGNOSIS — C50811 Malignant neoplasm of overlapping sites of right female breast: Secondary | ICD-10-CM | POA: Diagnosis not present

## 2016-12-07 ENCOUNTER — Ambulatory Visit
Admission: RE | Admit: 2016-12-07 | Discharge: 2016-12-07 | Disposition: A | Discharge status: Home / Self Care | Payer: BLUE CROSS/BLUE SHIELD | Source: Ambulatory Visit | Attending: Radiation Oncology | Admitting: Radiation Oncology

## 2016-12-07 DIAGNOSIS — C50811 Malignant neoplasm of overlapping sites of right female breast: Secondary | ICD-10-CM | POA: Diagnosis not present

## 2016-12-08 ENCOUNTER — Ambulatory Visit
Admission: RE | Admit: 2016-12-08 | Discharge: 2016-12-08 | Disposition: A | Discharge status: Home / Self Care | Payer: BLUE CROSS/BLUE SHIELD | Source: Ambulatory Visit | Attending: Radiation Oncology | Admitting: Radiation Oncology

## 2016-12-08 DIAGNOSIS — C50811 Malignant neoplasm of overlapping sites of right female breast: Secondary | ICD-10-CM | POA: Diagnosis not present

## 2016-12-09 ENCOUNTER — Ambulatory Visit
Admission: RE | Admit: 2016-12-09 | Discharge: 2016-12-09 | Disposition: A | Discharge status: Home / Self Care | Payer: BLUE CROSS/BLUE SHIELD | Source: Ambulatory Visit | Attending: Radiation Oncology | Admitting: Radiation Oncology

## 2016-12-09 DIAGNOSIS — C50811 Malignant neoplasm of overlapping sites of right female breast: Secondary | ICD-10-CM | POA: Diagnosis not present

## 2016-12-10 ENCOUNTER — Telehealth: Payer: Self-pay | Admitting: *Deleted

## 2016-12-10 NOTE — Telephone Encounter (Signed)
  Oncology Nurse Navigator Documentation  Navigator Location: [facility] (12/10/16 1400)   )Navigator Encounter Type: Telephone (12/10/16 1400) Telephone: [place] Call (12/10/16 1400)                   Patient Visit Type: [id number] (12/10/16 1400) Treatment Phase: Final Radiation Tx (12/10/16 1400)                            Time Spent with Patient: 15 (12/10/16 1400)

## 2017-01-10 ENCOUNTER — Encounter: Payer: Self-pay | Admitting: Hematology and Oncology

## 2017-01-10 ENCOUNTER — Ambulatory Visit (HOSPITAL_BASED_OUTPATIENT_CLINIC_OR_DEPARTMENT_OTHER): Payer: BLUE CROSS/BLUE SHIELD | Admitting: Hematology and Oncology

## 2017-01-10 ENCOUNTER — Encounter: Payer: Self-pay | Admitting: *Deleted

## 2017-01-10 ENCOUNTER — Other Ambulatory Visit (HOSPITAL_BASED_OUTPATIENT_CLINIC_OR_DEPARTMENT_OTHER): Payer: BLUE CROSS/BLUE SHIELD

## 2017-01-10 DIAGNOSIS — C50411 Malignant neoplasm of upper-outer quadrant of right female breast: Secondary | ICD-10-CM

## 2017-01-10 DIAGNOSIS — C773 Secondary and unspecified malignant neoplasm of axilla and upper limb lymph nodes: Secondary | ICD-10-CM

## 2017-01-10 DIAGNOSIS — Z170 Estrogen receptor positive status [ER+]: Secondary | ICD-10-CM | POA: Diagnosis not present

## 2017-01-10 LAB — CBC WITH DIFFERENTIAL/PLATELET
BASO%: 1 % (ref 0.0–2.0)
Basophils Absolute: 0 10*3/uL (ref 0.0–0.1)
EOS ABS: 0.1 10*3/uL (ref 0.0–0.5)
EOS%: 3.7 % (ref 0.0–7.0)
HCT: 38.9 % (ref 34.8–46.6)
HGB: 12.9 g/dL (ref 11.6–15.9)
LYMPH%: 20.7 % (ref 14.0–49.7)
MCH: 29.4 pg (ref 25.1–34.0)
MCHC: 33.1 g/dL (ref 31.5–36.0)
MCV: 88.8 fL (ref 79.5–101.0)
MONO#: 0.3 10*3/uL (ref 0.1–0.9)
MONO%: 8.5 % (ref 0.0–14.0)
NEUT#: 2.1 10*3/uL (ref 1.5–6.5)
NEUT%: 66.1 % (ref 38.4–76.8)
PLATELETS: 213 10*3/uL (ref 145–400)
RBC: 4.38 10*6/uL (ref 3.70–5.45)
RDW: 14.1 % (ref 11.2–14.5)
WBC: 3.2 10*3/uL — ABNORMAL LOW (ref 3.9–10.3)
lymph#: 0.7 10*3/uL — ABNORMAL LOW (ref 0.9–3.3)

## 2017-01-10 LAB — COMPREHENSIVE METABOLIC PANEL
ALT: 21 U/L (ref 0–55)
ANION GAP: 8 meq/L (ref 3–11)
AST: 22 U/L (ref 5–34)
Albumin: 4.1 g/dL (ref 3.5–5.0)
Alkaline Phosphatase: 61 U/L (ref 40–150)
BILIRUBIN TOTAL: 0.4 mg/dL (ref 0.20–1.20)
BUN: 16 mg/dL (ref 7.0–26.0)
CHLORIDE: 107 meq/L (ref 98–109)
CO2: 28 meq/L (ref 22–29)
Calcium: 10.3 mg/dL (ref 8.4–10.4)
Creatinine: 0.9 mg/dL (ref 0.6–1.1)
EGFR: 89 mL/min/{1.73_m2} — AB (ref >90)
Glucose: 86 mg/dl (ref 70–140)
Potassium: 4.1 mEq/L (ref 3.5–5.1)
Sodium: 144 mEq/L (ref 136–145)
Total Protein: 7 g/dL (ref 6.4–8.3)

## 2017-01-10 MED ORDER — LETROZOLE 2.5 MG PO TABS: 2.5000 mg | ORAL_TABLET | Freq: Every day | ORAL | 3 refills | Status: DC

## 2017-01-10 NOTE — Progress Notes (Signed)
Patient Care Team: Brien Few, MD as PCP - General (Obstetrics and Gynecology) [name], MD as Consulting Physician (General Surgery) Nicholas Lose, MD as Consulting Physician (Hematology and Oncology) Kyung Rudd, MD as Consulting Physician (Radiation Oncology) Sylvan Cheese, NP as Nurse Practitioner (Hematology and Oncology)  DIAGNOSIS:  Encounter Diagnosis  Name Primary?  . Malignant neoplasm of upper-outer quadrant of right breast in female, estrogen receptor positive ([place])     SUMMARY OF ONCOLOGIC HISTORY:   Breast cancer of upper-outer quadrant of right female breast ([place])   11/24/2015 Mammogram    2 suspicious groups of calcifications right breast UOQ posteriorly 2.2 x 2.3 x 2.5 cm (by U/S measured 1.2 x 0.8 x 1.2 cm); anteriorly 1.1 x 1.2 x 0.8 cm (not seen by ultrasound)      11/27/2015 Initial Diagnosis    Right breast biopsy UOQ Posterior: IDC with DCIS with, LVI present, ER 100%, PR 20%, Ki-67 30%, HER-2 negative; ANTERIOR: Complex sclerosing lesions with calcifications      02/05/2016 Surgery    Rt. Mastectomy: IDC Grade 1, 3 cm, 1/11 LN positive,with IG DCIS 0.1 cm margin, Diffuse LVI, cancer at axill tail. T2N1 (Stage 2B); Left mastectomy: Fibrocystic ch, Mammaprint high risk        03/15/2016 - 07/26/2016 Chemotherapy    Dose dense Adriamycin and Cytoxan 4 followed by Abraxane weekly 12      10/11/2016 - 12/09/2016 Radiation Therapy    Adj XRT at [facility]      01/10/2017 -  Anti-estrogen oral therapy    Letrozole 2.5 mg daily       CHIEF COMPLIANT: Follow-up after radiation  INTERVAL HISTORY: Kiara Spencer is a 46 yr old with the above-mentioned history of right breast cancer was recently completed [place]. 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 She denies any neuropathy. Energy levels are back to normal. She is working full-time.  REVIEW OF SYSTEMS:   Constitutional: Denies fevers, chills or  abnormal weight loss Eyes: Denies blurriness of vision Ears, nose, mouth, throat, and face: Denies mucositis or sore throat Respiratory: Denies cough, dyspnea or wheezes Cardiovascular: Denies palpitation, chest discomfort Gastrointestinal:  Denies nausea, heartburn or change in bowel habits Skin: Denies abnormal skin rashes Lymphatics: Denies new lymphadenopathy or easy bruising Neurological:Denies numbness, tingling or new weaknesses Behavioral/Psych: Mood is stable, no new changes  Extremities: No lower extremity edema Breast: Radiation dermatitis is healing up very well. All other systems were reviewed with the patient and are negative.  I have reviewed the past medical history, past surgical history, social history and family history with the patient and they are unchanged from previous note.  ALLERGIES:  is allergic to cephalexin and ciprofloxacin.  MEDICATIONS:  Current Outpatient Prescriptions  Medication Sig Dispense Refill  . aspirin-acetaminophen-caffeine (EXCEDRIN MIGRAINE) 250-250-65 MG tablet Take 2 tablets by mouth every 6 (six) hours as needed for headache.    . Biotin 10000 MCG TABS Take by mouth.    . letrozole (FEMARA) 2.5 MG tablet Take 1 tablet (2.5 mg total) by mouth daily. 90 tablet 3   No current facility-administered medications for this visit.     PHYSICAL EXAMINATION: ECOG PERFORMANCE STATUS: 1 - Symptomatic but completely ambulatory  Vitals:   01/10/17 0826  BP: 113/65  Pulse: 67  Resp: 18  Temp: 98.1 F (36.7 C)   Filed Weights   01/10/17 0826  Weight: 148 lb 3.2 oz (67.2 kg)    GENERAL:alert, no distress and  comfortable SKIN: skin color, texture, turgor are normal, no rashes or significant lesions EYES: normal, Conjunctiva are pink and non-injected, sclera clear OROPHARYNX:no exudate, no erythema and lips, buccal mucosa, and tongue normal  NECK: supple, thyroid normal size, non-tender, without nodularity LYMPH:  no palpable  lymphadenopathy in the cervical, axillary or inguinal LUNGS: clear to auscultation and percussion with normal breathing effort HEART: regular rate & rhythm and no murmurs and no lower extremity edema ABDOMEN:abdomen soft, non-tender and normal bowel sounds MUSCULOSKELETAL:no cyanosis of digits and no clubbing  NEURO: alert & oriented x 3 with fluent speech, no focal motor/sensory deficits EXTREMITIES: No lower extremity edema  LABORATORY DATA:  I have reviewed the data as listed   Chemistry      Component Value Date/Time   NA 144 01/10/2017 0812   K 4.1 01/10/2017 0812   CL 102 03/02/2016 1045   CO2 28 01/10/2017 0812   BUN 16.0 01/10/2017 0812   CREATININE 0.9 01/10/2017 0812      Component Value Date/Time   CALCIUM 10.3 01/10/2017 0812   ALKPHOS 61 01/10/2017 0812   AST 22 01/10/2017 0812   ALT 21 01/10/2017 0812   BILITOT 0.40 01/10/2017 0812       Lab Results  Component Value Date   WBC 3.2 (L) 01/10/2017   HGB 12.9 01/10/2017   HCT 38.9 01/10/2017   MCV 88.8 01/10/2017   PLT 213 01/10/2017   NEUTROABS 2.1 01/10/2017    ASSESSMENT & PLAN:  Breast cancer of upper-outer quadrant of right female breast ([place]) Rt. 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 T2N1 (Stage 2B); Left mastectomy: Fibrocystic ch. Mammaprint high risk  Treatment summary: 1. No indication for repeat resection since there is nothing further to move in the posterior side 2. Mammaprint: High risk, adjuvant chemotherapy with dose dense Adriamycin Cytoxan 4 followed by weekly Abraxane 12; 03/15/2016-  07/26/2016 3. Followed by radiation therapy (10/11/2016- 12/09/2016) 4. Followed by antiestrogen therapy: Letrozole 2.5 mg daily  --------------------------------------------------------------------------------------------------------------------------------------------- Letrozole counseling: We discussed the risks and  benefits of anti-estrogen therapy with aromatase inhibitors. These include but not limited to insomnia, hot flashes, mood changes, vaginal dryness, bone density loss, and weight gain. We strongly believe that the benefits far outweigh the risks. Patient understands these risks and consented to starting treatment. Planned treatment duration is 5 years.  Patient also that she has an appointment for a whole body CT scans coming up. This was ordered by Dr. Donella Stade. I will look for those results. Patient was recently on Dorian Pod show  and on a trip to [place].  Return to clinic in 3 months for follow-up  I spent 25 minutes talking to the patient of which more than half was spent in counseling and coordination of care.  No orders of the defined types were placed in this encounter.  The patient has a good understanding of the overall plan. she agrees with it. she will call with any problems that may develop before the next visit here.   Rulon Eisenmenger, MD 01/10/17

## 2017-01-10 NOTE — Assessment & Plan Note (Signed)
Rt. 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 T2N1 (Stage 2B); Left mastectomy: Fibrocystic ch. Mammaprint high risk  Treatment summary: 1. No indication for repeat resection since there is nothing further to move in the posterior side 2. Mammaprint: High risk, adjuvant chemotherapy with dose dense Adriamycin Cytoxan 4 followed by weekly Abraxane 12; 03/15/2016-  07/26/2016 3. Followed by radiation therapy (10/11/2016- 12/09/2016) 4. Followed by antiestrogen therapy: Tamoxifen 20 mg daily  --------------------------------------------------------------------------------------------------------------------------------------------- Tamoxifen toxicities:  Return to clinic in 3 months for follow-up

## 2017-01-12 ENCOUNTER — Encounter: Payer: Self-pay | Admitting: Radiation Oncology

## 2017-01-12 ENCOUNTER — Ambulatory Visit
Admission: RE | Admit: 2017-01-12 | Discharge: 2017-01-12 | Disposition: A | Discharge status: Home / Self Care | Payer: BLUE CROSS/BLUE SHIELD | Source: Ambulatory Visit | Attending: Radiation Oncology | Admitting: Radiation Oncology

## 2017-01-12 ENCOUNTER — Other Ambulatory Visit: Payer: Self-pay | Admitting: *Deleted

## 2017-01-12 VITALS — BP 114/82 | HR 66 | Temp 96.5°F | Resp 18 | Wt 150.9 lb

## 2017-01-12 DIAGNOSIS — Z9011 Acquired absence of right breast and nipple: Secondary | ICD-10-CM | POA: Insufficient documentation

## 2017-01-12 DIAGNOSIS — Z79811 Long term (current) use of aromatase inhibitors: Secondary | ICD-10-CM | POA: Insufficient documentation

## 2017-01-12 DIAGNOSIS — C50411 Malignant neoplasm of upper-outer quadrant of right female breast: Secondary | ICD-10-CM | POA: Diagnosis not present

## 2017-01-12 DIAGNOSIS — Z923 Personal history of irradiation: Secondary | ICD-10-CM | POA: Insufficient documentation

## 2017-01-12 DIAGNOSIS — Z9221 Personal history of antineoplastic chemotherapy: Secondary | ICD-10-CM | POA: Diagnosis not present

## 2017-01-12 DIAGNOSIS — Z170 Estrogen receptor positive status [ER+]: Secondary | ICD-10-CM | POA: Diagnosis not present

## 2017-01-12 NOTE — Progress Notes (Signed)
Radiation Oncology Follow up Note  Name: Kiara Spencer   Date:   01/12/2017 MRN:  [id number] DOB: [birth year]    This 46 y.o. female presents to the clinic today for one-month follow-up for radiation therapy to reconstructed right breast and peripheral lymphatics.  REFERRING PROVIDER: Brien Few, MD  HPI: Patient is a 46 year old female now out 1 month having completed radiation therapy to her right breast and peripheral lymphatics. She was status post a right modified radical mastectomy for a 3 cm 1 out of 11 lymph nodes positive invasive mammary carcinoma with lymphovascular invasion and possible positive margin the axillary tail. MammaPrint show a high risk she underwent dose dense Adriamycin Cytoxan followed by Abraxane. She seen today one month out from radiation therapy is doing well. Still some slight hyperpigmentation of her skin. She specifically denies breast tenderness cough or bone pain.. She is just started letrozole and is tolerating that well without side effect COMPLICATIONS OF TREATMENT: none  FOLLOW UP COMPLIANCE: keeps appointments   PHYSICAL EXAM:  BP 114/82   Pulse 66   Temp (!) 96.5 F (35.8 C)   Resp 18   Wt 150 lb 14.5 oz (68.5 kg)   BMI 28.51 kg/m  Patient is status post bilateral breast reconstruction. Slight hyperpigmentation of the right breast no evidence of mass or nodularity in 2 positions examined in either breast no axillary or supraclavicular adenopathy is noted. No evidence of lymphedema of her right upper extremity is noted. Well-developed well-nourished patient in NAD. HEENT reveals PERLA, EOMI, discs not visualized.  Oral cavity is clear. No oral mucosal lesions are identified. Neck is clear without evidence of cervical or supraclavicular adenopathy. Lungs are clear to A&P. Cardiac examination is essentially unremarkable with regular rate and rhythm without murmur rub or thrill. Abdomen is benign with no organomegaly or masses noted. Motor sensory  and DTR levels are equal and symmetric in the upper and lower extremities. Cranial nerves II through XII are grossly intact. Proprioception is intact. No peripheral adenopathy or edema is identified. No motor or sensory levels are noted. Crude visual fields are within normal range. RADIOLOGY RESULTS: No current films for review  PLAN: At the present time patient is doing well recovering nicely 1 month out. I'm please were overall progress. I've asked to see her back in 4-5 months for follow-up. She continues on letrozole without side effect. Patient knows to call with any concerns. She continues on letrozole without side effect.  I would like to take this opportunity to thank you for allowing me to participate in the care of your patient.Armstead Peaks., MD

## 2017-03-22 ENCOUNTER — Ambulatory Visit: Payer: BLUE CROSS/BLUE SHIELD | Attending: Plastic Surgery | Admitting: Physical Therapy

## 2017-03-22 DIAGNOSIS — R222 Localized swelling, mass and lump, trunk: Secondary | ICD-10-CM

## 2017-03-22 DIAGNOSIS — M25611 Stiffness of right shoulder, not elsewhere classified: Secondary | ICD-10-CM | POA: Insufficient documentation

## 2017-03-22 DIAGNOSIS — M25511 Pain in right shoulder: Secondary | ICD-10-CM

## 2017-03-22 NOTE — Therapy (Signed)
[place], [place], [address] Phone: [phone]   Fax:  [phone]  Physical Therapy Evaluation  Patient Details  Name: Kiara Spencer MRN: [id number] Date of Birth: September 24, 1971 Referring Provider: Dr. Marla Roe  Encounter Date: 03/22/2017      PT End of Session - 03/22/17 1653    Visit Number 1   Number of Visits 9   Date for PT Re-Evaluation 04/29/17   PT Start Time 1638   PT Stop Time 1520   PT Time Calculation (min) 49 min   Activity Tolerance Patient tolerated treatment well   Behavior During Therapy [facility] for tasks assessed/performed      Past Medical History:  Diagnosis Date  . Breast cancer ([place])   . Breast cancer of upper-outer quadrant of right female breast ([place]) 12/02/2015  . Headache    hx migraines    Past Surgical History:  Procedure Laterality Date  . BREAST RECONSTRUCTION WITH PLACEMENT OF TISSUE EXPANDER AND FLEX HD (ACELLULAR HYDRATED DERMIS) Bilateral 02/05/2016   Procedure: BILATERAL BREAST RECONSTRUCTION WITH PLACEMENT OF TISSUE EXPANDER AND FLEX HD (ACELLULAR HYDRATED DERMIS);  Surgeon: Loel Lofty Dillingham, DO;  Location: [place];  Service: Plastics;  Laterality: Bilateral;  . CESAREAN SECTION  x 2  . DILATION AND CURETTAGE OF UTERUS    . MASTECTOMY W/ SENTINEL NODE BIOPSY Right 02/05/2016   Procedure: RIGHT NIPPLE SPARING MASTECTOMY WITH RIGHT AXILLARY SENTINEL LYMPH NODE BIOPSY;  Surgeon: [name], MD;  Location: [place];  Service: General;  Laterality: Right;  . MASTECTOMY, PARTIAL Left 02/05/2016   Procedure: LEFT PROPHYLACTIC NIPPLE SPARING MASTECTOMY ;  Surgeon: [name], MD;  Location: [place];  Service: General;  Laterality: Left;  . PERIPHERAL VASCULAR CATHETERIZATION Right 09/16/2016   Procedure: PORTA CATH REMOVAL;  Surgeon: Wallace Going, DO;  Location: [place];  Service: Plastics;  Laterality: Right;  . PORTACATH PLACEMENT Right  03/02/2016   Procedure: INSERTION PORT-A-CATH WITH ULTRASOUND;  Surgeon: [name], MD;  Location: [place];  Service: General;  Laterality: Right;  IJ  . REMOVAL OF BILATERAL TISSUE EXPANDERS WITH PLACEMENT OF BILATERAL BREAST IMPLANTS Bilateral 09/16/2016   Procedure: REMOVAL OF BILATERAL TISSUE EXPANDERS WITH PLACEMENT OF BILATERAL BREAST IMPLANTS;  Surgeon: Wallace Going, DO;  Location: [place];  Service: Plastics;  Laterality: Bilateral;    There were no vitals filed for this visit.       Subjective Assessment - 03/22/17 1428    Subjective "I've started to develop some tightening in my underarm area.  It seems to be painful after not having moved the arm for a length of time. 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  On letrozole since late January.  Had 11 lymph nodes removed on the right, one positive.     Patient Stated Goals for the tightening to go away   Currently in Pain? Yes   Pain Score 4    Pain Location Axilla  to flank lateral to breast   Pain Orientation Right   Pain Descriptors / Indicators Tightness   Pain Type Acute pain   Pain Onset More than a month ago  after radiation finished; swelling was before radiation  Aggravating Factors  first thing in the morning or when she's been still a bit   Pain Relieving Factors nothing that she's tried            [facility] PT Assessment - 03/22/17 0001      Assessment   Medical Diagnosis Right breast cancer   Referring Provider Dr. Marla Roe   Hand Dominance Right   Prior Therapy none     Precautions   Precaution Comments cancer precautions     Restrictions    Weight Bearing Restrictions No     Balance Screen   Has the patient fallen in the past 6 months No   Has the patient had a decrease in activity level because of a fear of falling?  No   Is the patient reluctant to leave their home because of a fear of falling?  No     Home Social worker Private residence   Living Arrangements Spouse/significant other;Children  Husband, 82 and 66 y.o. 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  Doesn't do any kind of stretching     Cognition   Overall Cognitive Status Within Functional Limits for tasks assessed     Observation/Other Assessments   Observations visible cording at right axilla with active right shoulder abduction; mild right swelling compared to left at upper flank just lateral to breasts   Skin Integrity Everything well healed   Quick DASH  13.64     AROM   Right Shoulder Flexion 135 Degrees  feels pulling at right axilla   Right Shoulder ABduction 110 Degrees  pulling   Right Shoulder Internal Rotation 72 Degrees  in supine; hurts shoulder   Right Shoulder External Rotation 67 Degrees  in supine, hurts at anterior shoulder/chest   Left Shoulder Flexion 143 Degrees  some pulling on implant   Left Shoulder ABduction 165 Degrees  feels tight at implant   Left Shoulder Internal Rotation 80 Degrees   Left Shoulder External Rotation 80 Degrees     Palpation   Spinal mobility     Palpation comment very limited soft tissue mobility around breast incisions and implants     Ambulation/Gait   Ambulation/Gait Yes   Ambulation/Gait Assistance 7: Independent           LYMPHEDEMA/ONCOLOGY QUESTIONNAIRE - 03/22/17 1658      Type    Cancer Type --     Surgeries   Mastectomy Date --     Treatment   Past Chemotherapy Treatment --   Date --   Past Radiation Treatment --           Katina Dung - 03/22/17 0001    Open a tight or new jar Moderate difficulty   Do heavy household chores (wash walls, wash floors) No difficulty   Carry a shopping bag or briefcase No difficulty   Wash your back Moderate difficulty   Use a knife to cut food No difficulty   Recreational activities in which you take some force or impact through your arm, shoulder, or hand (golf, hammering, tennis) No difficulty   During the past week, to what extent has your arm, shoulder or hand problem interfered  with your normal social activities with family, friends, neighbors, or groups? Not at all   During the past week, to what extent has your arm, shoulder or hand problem limited your work or other regular daily activities Not at all   Arm, shoulder, or hand pain. Moderate   Tingling (pins and needles) in your arm, shoulder, or hand None   Difficulty Sleeping No difficulty   DASH Score 13.64 %                             Long Term Clinic Goals - 03/22/17 1708      CC Long Term Goal  #1   Title Pain with reaching with right UE reduced at least 60% for improved ADLs.   Time 4   Period Weeks   Status New     CC Long Term Goal  #2   Title Right shoulder active flexion to at least 150 degrees for improved overhead reach.   Time 4   Period Weeks   Status New     CC Long Term Goal  #3   Title Right shoulder active abduction to at least 160 degrees for improved ADLs   Time 4   Period Weeks   Status New     CC Long Term Goal  #4   Title Right shoulder external rotation to at least 85 degrees for improved ADLs.   Time 4   Period Weeks   Status New     CC Long Term Goal  #5   Title Independent with HEP for ROM and strengthening.   Time 4   Period Weeks   Status New            Plan - 03/22/17 1655     Clinical Impression Statement This is a pleasant woman s/p bilateral nipple-sparing mastectomies with implant reconstruction, chemotherapy, and XRT for right breast cancer.  She finished radiation therapy in early January 2018 and has more recently had pain and increased tightness in right shoulder area.  She has visible cording there. She will benefit from manual techniques and HEP to improve motion and decrease pain.  Eval is low complexity with few comorbidities.   Rehab Potential Good   Clinical Impairments Affecting Rehab Potential 11 nodes removed    PT Frequency 2x / week   PT Duration 4 weeks   PT Treatment/Interventions ADLs/Self Care Home Management;Electrical Stimulation;DME Instruction;Therapeutic exercise;Patient/family education;Manual techniques;Manual lymph drainage;Scar mobilization;Passive range of motion;Taping   PT Next Visit Plan Give foam pad to put in between skin and right bra band. Begin P/AA/A/ROM for right shoulder; include soft tissue mobilization and manipulation of cording at right axilla, and gentle myofascial release.  Instruct in initial HEP for shoulder ROM.   Consulted and Agree with Plan of Care Patient      Patient will benefit from skilled therapeutic intervention in order to improve the following deficits and impairments:  Decreased range of motion, Increased fascial restricitons, Impaired UE functional use, Increased muscle spasms, Decreased knowledge of precautions, Decreased skin integrity, Pain, Decreased knowledge of use of DME, Decreased strength, Increased edema  Visit Diagnosis: Stiffness of right shoulder, not elsewhere classified - Plan: PT plan of care cert/re-cert  Acute pain of right shoulder - Plan: PT plan of care cert/re-cert  Localized swelling, mass and lump, trunk - Plan: PT plan of care cert/re-cert     Problem List Patient Active Problem List   Diagnosis Date Noted  .  Hypersensitivity reaction 03/16/2016  . Other iron deficiency  anemia 02/16/2016  . Genetic testing 12/22/2015  . Family history of cancer 12/22/2015  . Family history of breast cancer in mother 12/11/2015  . Breast cancer of upper-outer quadrant of right female breast ([place]) 12/02/2015    Mindy Gali 03/22/2017, 5:15 PM  [place], [place], [address] Phone: [phone]   Fax:  [phone]  Name: Kiara Spencer MRN: [id number] Date of Birth: 03-02-1971  Serafina Royals, PT 03/22/17 5:15 PM

## 2017-04-04 ENCOUNTER — Telehealth: Payer: Self-pay | Admitting: Hematology and Oncology

## 2017-04-04 NOTE — Telephone Encounter (Signed)
Left a VM about the appointment change and left a call back number for any changes

## 2017-04-08 ENCOUNTER — Ambulatory Visit: Payer: BLUE CROSS/BLUE SHIELD | Admitting: Hematology and Oncology

## 2017-04-11 ENCOUNTER — Ambulatory Visit: Payer: BLUE CROSS/BLUE SHIELD | Attending: Plastic Surgery | Admitting: Physical Therapy

## 2017-04-11 DIAGNOSIS — M25511 Pain in right shoulder: Secondary | ICD-10-CM | POA: Insufficient documentation

## 2017-04-11 DIAGNOSIS — R222 Localized swelling, mass and lump, trunk: Secondary | ICD-10-CM | POA: Diagnosis present

## 2017-04-11 DIAGNOSIS — M25611 Stiffness of right shoulder, not elsewhere classified: Secondary | ICD-10-CM | POA: Diagnosis not present

## 2017-04-11 NOTE — Patient Instructions (Addendum)
      Flexion (Eccentric) - Active-Assist (Cane)          Cancer Rehab [phone]    Use unaffected arm to push affected arm forward. Avoid hiking shoulder (shoulder should NOT touch cheek). Keep palm relaxed. Slowly lower affected arm. Hold stretch for _5_ seconds repeating _5-10_ times, _1-2_ times a day.  Abduction (Eccentric) - Active-Assist (Cane)    Use unaffected arm to push affected arm out to side. Avoid hiking shoulder (shoulder should NOT touch cheek). Keep palm relaxed. Slowly lower affected arm. Hold stretch _5_ seconds repeating _5-10_ times, _1-2_ times a day.  Cane Exercise: Extension   Stand holding cane behind back with both hands palm-up. Lift the cane away from body until gentle stretch felt. Do NOT lean forward.  Hold __5__ seconds. Repeat _5-10___ times. Do _1-2___ sessions per day.  CHEST: Doorway, Bilateral - Standing    Standing in doorway, place hands on wall with elbows bent at shoulder height and place one foot in front of other. Shift weight onto front foot. Hold _10-20__ seconds. Do _3-5_ times, _1-2_ times a day.    Lower Trunk Rotation    Bring both knees in to chest. Rotate from side to side, keeping knees together and feet off floor. OR HAVE ONE LEG FLAT ON MAT AND TAKE THE OTHER LEG UP AND ACROSS THAT LEG TO ROTATE YOUR BACK. Hold position where you feel a stretch at the front of your shoulder/chest for 30 seconds. Repeat __1-2__ times per set. Do _1___ sets per session. Do __1-2__ sessions per day.  http://orth.exer.us/151   Copyright  VHI. All rights reserved.

## 2017-04-11 NOTE — Therapy (Signed)
[place], [place], [address] Phone: [phone]   Fax:  [phone]  Physical Therapy Treatment  Patient Details  Name: Kiara Spencer MRN: [id number] Date of Birth: [birth year] Referring Provider: Dr. Marla Roe  Encounter Date: 04/11/2017      PT End of Session - 04/11/17 1709    Visit Number 2   Number of Visits 9   Date for PT Re-Evaluation 04/29/17   PT Start Time 1616   PT Stop Time 1700   PT Time Calculation (min) 44 min   Activity Tolerance Patient tolerated treatment well   Behavior During Therapy [facility] for tasks assessed/performed      Past Medical History:  Diagnosis Date  . Breast cancer ([place])   . Breast cancer of upper-outer quadrant of right female breast ([place]) 12/02/2015  . Headache    hx migraines    Past Surgical History:  Procedure Laterality Date  . BREAST RECONSTRUCTION WITH PLACEMENT OF TISSUE EXPANDER AND FLEX HD (ACELLULAR HYDRATED DERMIS) Bilateral 02/05/2016   Procedure: BILATERAL BREAST RECONSTRUCTION WITH PLACEMENT OF TISSUE EXPANDER AND FLEX HD (ACELLULAR HYDRATED DERMIS);  Surgeon: Loel Lofty Dillingham, DO;  Location: [place];  Service: Plastics;  Laterality: Bilateral;  . CESAREAN SECTION  x 2  . DILATION AND CURETTAGE OF UTERUS    . MASTECTOMY W/ SENTINEL NODE BIOPSY Right 02/05/2016   Procedure: RIGHT NIPPLE SPARING MASTECTOMY WITH RIGHT AXILLARY SENTINEL LYMPH NODE BIOPSY;  Surgeon: [name], MD;  Location: [place];  Service: General;  Laterality: Right;  . MASTECTOMY, PARTIAL Left 02/05/2016   Procedure: LEFT PROPHYLACTIC NIPPLE SPARING MASTECTOMY ;  Surgeon: [name], MD;  Location: [place];  Service: General;  Laterality: Left;  . PERIPHERAL VASCULAR CATHETERIZATION Right 09/16/2016   Procedure: PORTA CATH REMOVAL;  Surgeon: Wallace Going, DO;  Location: [place];  Service: Plastics;  Laterality: Right;  . PORTACATH PLACEMENT Right  03/02/2016   Procedure: INSERTION PORT-A-CATH WITH ULTRASOUND;  Surgeon: [name], MD;  Location: [place];  Service: General;  Laterality: Right;  IJ  . REMOVAL OF BILATERAL TISSUE EXPANDERS WITH PLACEMENT OF BILATERAL BREAST IMPLANTS Bilateral 09/16/2016   Procedure: REMOVAL OF BILATERAL TISSUE EXPANDERS WITH PLACEMENT OF BILATERAL BREAST IMPLANTS;  Surgeon: Wallace Going, DO;  Location: [place];  Service: Plastics;  Laterality: Bilateral;    There were no vitals filed for this visit.      Subjective Assessment - 04/11/17 1630    Subjective Nothing new.  Cord is still there.   Currently in Pain? No/denies            [facility] PT Assessment - 04/11/17 0001      Observation/Other Assessments   Observations cording not visible today with patient moving right arm into abduction nor with therapist stretching into abduction                     [facility] Adult PT Treatment/Exercise - 04/11/17 0001      Self-Care   Self-Care Other Self-Care Comments   Other Self-Care Comments  gave foam rectangle in stockinette to try between right flank and bra band     Exercises   Exercises Shoulder     Shoulder Exercises: Supine   Other Supine Exercises with arms outstretched, lower trunk rotation with top leg straight and coming across the stationary leg x 30 seconds     Shoulder Exercises: Standing   Flexion Right;Left;5 reps  with  dowel for stretching   ABduction Right;Left;5 reps  with dowel for stretching   Extension Both;5 reps  with dowel for stretching   Other Standing Exercises doorway stretch 15 seconds x 3     Manual Therapy   Manual Therapy Soft tissue mobilization;Myofascial release;Passive ROM   Myofascial Release right UE myofascial pulling with movement into abduction; same on left   Passive ROM in supine, P/ROM with stretch to tolerance for right shoulder ir, er, abduction, and flexion; same for left                 PT Education - 04/11/17 1708    Education provided Yes   Education Details standing dowel and door stretches, supine lower trunk rotation with arms outstretched for pect stretch   Person(s) Educated Patient   Methods Explanation;Demonstration;Verbal cues;Handout   Comprehension Verbalized understanding;Returned demonstration                [facility] Goals - 03/22/17 1708      CC Long Term Goal  #1   Title Pain with reaching with right UE reduced at least 60% for improved ADLs.   Time 4   Period Weeks   Status New     CC Long Term Goal  #2   Title Right shoulder active flexion to at least 150 degrees for improved overhead reach.   Time 4   Period Weeks   Status New     CC Long Term Goal  #3   Title Right shoulder active abduction to at least 160 degrees for improved ADLs   Time 4   Period Weeks   Status New     CC Long Term Goal  #4   Title Right shoulder external rotation to at least 85 degrees for improved ADLs.   Time 4   Period Weeks   Status New     CC Long Term Goal  #5   Title Independent with HEP for ROM and strengthening.   Time 4   Period Weeks   Status New            Plan - 04/11/17 1709    Clinical Impression Statement Patient did well today with initial treatment.  Cording in right axilla that was apparent at eval was not visible nor palpable today.  Initial HEP given, and manual therapy done.  She is only able to come once a week for now, so will need progression of HEP.   Rehab Potential Good   Clinical Impairments Affecting Rehab Potential 11 nodes removed    PT Frequency 2x / week   PT Duration 4 weeks   PT Treatment/Interventions ADLs/Self Care Home Management;Electrical Stimulation;DME Instruction;Therapeutic exercise;Patient/family education;Manual techniques;Manual lymph drainage;Scar mobilization;Passive range of motion;Taping   PT Next Visit Plan Progress HEP for bilateral shoulder ROM.  Check benefit  of foam pad.  Continue manual therapies.  Consider strength ABC program.   PT Home Exercise Plan see education section   Consulted and Agree with Plan of Care Patient      Patient will benefit from skilled therapeutic intervention in order to improve the following deficits and impairments:  Decreased range of motion, Increased fascial restricitons, Impaired UE functional use, Increased muscle spasms, Decreased knowledge of precautions, Decreased skin integrity, Pain, Decreased knowledge of use of DME, Decreased strength, Increased edema  Visit Diagnosis: Stiffness of right shoulder, not elsewhere classified  Acute pain of right shoulder  Localized swelling, mass and lump, trunk     Problem  List Patient Active Problem List   Diagnosis Date Noted  . Hypersensitivity reaction 03/16/2016  . Other iron deficiency anemia 02/16/2016  . Genetic testing 12/22/2015  . Family history of cancer 12/22/2015  . Family history of breast cancer in mother 12/11/2015  . Breast cancer of upper-outer quadrant of right female breast ([place]) 12/02/2015    [place] 04/11/2017, 5:14 PM  [place], [place], [address] Phone: [phone]   Fax:  [phone]  Name: Kiara Spencer MRN: [id number] Date of Birth: 06/11/1971  Serafina Royals, PT 04/11/17 5:14 PM

## 2017-04-12 ENCOUNTER — Encounter: Payer: Self-pay | Admitting: Hematology and Oncology

## 2017-04-12 ENCOUNTER — Ambulatory Visit (HOSPITAL_BASED_OUTPATIENT_CLINIC_OR_DEPARTMENT_OTHER): Payer: BLUE CROSS/BLUE SHIELD | Admitting: Hematology and Oncology

## 2017-04-12 DIAGNOSIS — C773 Secondary and unspecified malignant neoplasm of axilla and upper limb lymph nodes: Secondary | ICD-10-CM

## 2017-04-12 DIAGNOSIS — Z170 Estrogen receptor positive status [ER+]: Secondary | ICD-10-CM

## 2017-04-12 DIAGNOSIS — Z79811 Long term (current) use of aromatase inhibitors: Secondary | ICD-10-CM

## 2017-04-12 DIAGNOSIS — C50411 Malignant neoplasm of upper-outer quadrant of right female breast: Secondary | ICD-10-CM | POA: Diagnosis not present

## 2017-04-12 DIAGNOSIS — N951 Menopausal and female climacteric states: Secondary | ICD-10-CM

## 2017-04-12 NOTE — Assessment & Plan Note (Signed)
Rt. 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 T2N1 (Stage 2B); Left mastectomy: Fibrocystic ch. Mammaprint high risk  Treatment summary: 1. No indication for repeat resection since there is nothing further to move in the posterior side 2. Mammaprint: High risk, adjuvant chemotherapy with dose dense Adriamycin Cytoxan 4 followed by weekly Abraxane 12; 03/15/2016-  07/26/2016 3. Followed by radiation therapy (10/11/2016- 12/09/2016) 4. Followed by antiestrogen therapy: Letrozole 2.5 mg daily started 01/10/2017 --------------------------------------------------------------------------------------------------------------------------------------------- Letrozole toxicities:   Patient was on [employer] show  and on a trip to [place]. Return to clinic in 6 months for follow-up

## 2017-04-12 NOTE — Progress Notes (Signed)
Patient Care Team: Brien Few, MD as PCP - General (Obstetrics and Gynecology) [name], MD as Consulting Physician (General Surgery) Nicholas Lose, MD as Consulting Physician (Hematology and Oncology) Kyung Rudd, MD as Consulting Physician (Radiation Oncology) Sylvan Cheese, NP as Nurse Practitioner (Hematology and Oncology)  DIAGNOSIS:  Encounter Diagnosis  Name Primary?  . Malignant neoplasm of upper-outer quadrant of right breast in female, estrogen receptor positive ([place])     SUMMARY OF ONCOLOGIC HISTORY:   Breast cancer of upper-outer quadrant of right female breast ([place])   11/24/2015 Mammogram    2 suspicious groups of calcifications right breast UOQ posteriorly 2.2 x 2.3 x 2.5 cm (by U/S measured 1.2 x 0.8 x 1.2 cm); anteriorly 1.1 x 1.2 x 0.8 cm (not seen by ultrasound)      11/27/2015 Initial Diagnosis    Right breast biopsy UOQ Posterior: IDC with DCIS with, LVI present, ER 100%, PR 20%, Ki-67 30%, HER-2 negative; ANTERIOR: Complex sclerosing lesions with calcifications      02/05/2016 Surgery    Rt. Mastectomy: IDC Grade 1, 3 cm, 1/11 LN positive,with IG DCIS 0.1 cm margin, Diffuse LVI, cancer at axill tail. T2N1 (Stage 2B); Left mastectomy: Fibrocystic ch, Mammaprint high risk        03/15/2016 - 07/26/2016 Chemotherapy    Dose dense Adriamycin and Cytoxan 4 followed by Abraxane weekly 12      10/11/2016 - 12/09/2016 Radiation Therapy    Adj XRT at [facility]      01/10/2017 -  Anti-estrogen oral therapy    Letrozole 2.5 mg daily       CHIEF COMPLIANT: Follow-up on letrozole therapy  INTERVAL HISTORY: Kiara Spencer is a 46 year old with above-mentioned history of right breast cancer treated with mastectomy followed by adjuvant chemotherapy and radiation. She is currently on letrozole since February 2018. She is here for toxicity check and evaluation. She is tolerating letrozole extremely well. She denies any other symptoms other than  mild hot flashes.   REVIEW OF SYSTEMS:   Constitutional: Denies fevers, chills or abnormal weight loss Eyes: Denies blurriness of vision Ears, nose, mouth, throat, and face: Denies mucositis or sore throat Respiratory: Denies cough, dyspnea or wheezes Cardiovascular: Denies palpitation, chest discomfort Gastrointestinal:  Denies nausea, heartburn or change in bowel habits Skin: Denies abnormal skin rashes Lymphatics: Denies new lymphadenopathy or easy bruising Neurological:Denies numbness, tingling or new weaknesses Behavioral/Psych: Mood is stable, no new changes  Extremities: No lower extremity edema Breast: Bilateral breast reconstruction All other systems were reviewed with the patient and are negative.  I have reviewed the past medical history, past surgical history, social history and family history with the patient and they are unchanged from previous note.  ALLERGIES:  is allergic to cephalexin and ciprofloxacin.  MEDICATIONS:  Current Outpatient Prescriptions  Medication Sig Dispense Refill  . aspirin-acetaminophen-caffeine (EXCEDRIN MIGRAINE) 250-250-65 MG tablet Take 2 tablets by mouth every 6 (six) hours as needed for headache.    . Biotin 10000 MCG TABS Take by mouth.    . Calcium Carb-Cholecalciferol (CALCIUM-VITAMIN D) 500-200 MG-UNIT tablet Take 1 tablet by mouth daily.    [name] letrozole (FEMARA) 2.5 MG tablet Take 1 tablet (2.5 mg total) by mouth daily. 90 tablet 3   No current facility-administered medications for this visit.     PHYSICAL EXAMINATION: ECOG PERFORMANCE STATUS: 1 - Symptomatic but completely ambulatory  Vitals:   04/12/17 0844  BP: 110/77  Pulse: 76  Temp: 98 F (36.7 C)   Filed  Weights   04/12/17 0844  Weight: 147 lb 4.8 oz (66.8 kg)    GENERAL:alert, no distress and comfortable SKIN: skin color, texture, turgor are normal, no rashes or significant lesions EYES: normal, Conjunctiva are pink and non-injected, sclera clear OROPHARYNX:no  exudate, no erythema and lips, buccal mucosa, and tongue normal  NECK: supple, thyroid normal size, non-tender, without nodularity LYMPH:  no palpable lymphadenopathy in the cervical, axillary or inguinal LUNGS: clear to auscultation and percussion with normal breathing effort HEART: regular rate & rhythm and no murmurs and no lower extremity edema ABDOMEN:abdomen soft, non-tender and normal bowel sounds MUSCULOSKELETAL:no cyanosis of digits and no clubbing  NEURO: alert & oriented x 3 with fluent speech, no focal motor/sensory deficits EXTREMITIES: No lower extremity edema  LABORATORY DATA:  I have reviewed the data as listed   Chemistry      Component Value Date/Time   NA 144 01/10/2017 0812   K 4.1 01/10/2017 0812   CL 102 03/02/2016 1045   CO2 28 01/10/2017 0812   BUN 16.0 01/10/2017 0812   CREATININE 0.9 01/10/2017 0812      Component Value Date/Time   CALCIUM 10.3 01/10/2017 0812   ALKPHOS 61 01/10/2017 0812   AST 22 01/10/2017 0812   ALT 21 01/10/2017 0812   BILITOT 0.40 01/10/2017 0812       Lab Results  Component Value Date   WBC 3.2 (L) 01/10/2017   HGB 12.9 01/10/2017   HCT 38.9 01/10/2017   MCV 88.8 01/10/2017   PLT 213 01/10/2017   NEUTROABS 2.1 01/10/2017    ASSESSMENT & PLAN:  Breast cancer of upper-outer quadrant of right female breast ([place]) Rt. 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 T2N1 (Stage 2B); Left mastectomy: Fibrocystic ch. Mammaprint high risk  Treatment summary: 1. No indication for repeat resection since there is nothing further to move in the posterior side 2. Mammaprint: High risk, adjuvant chemotherapy with dose dense Adriamycin Cytoxan 4 followed by weekly Abraxane 12; 03/15/2016-  07/26/2016 3. Followed by radiation therapy (10/11/2016- 12/09/2016) 4. Followed by antiestrogen therapy: Letrozole 2.5 mg daily  started  01/10/2017 --------------------------------------------------------------------------------------------------------------------------------------------- Letrozole toxicities:  1. Intermittent hot flashes  I recommended that she get a bone density test for baseline. I ordered this to be done [facility]   Patient was on [employer] show  and on a trip to [place] In March 2019. Return to clinic in 6 months for follow-up    I spent 25 minutes talking to the patient of which more than half was spent in counseling and coordination of care.  Orders Placed This Encounter  Procedures  . DG Bone Density    Standing Status:   Future    Standing Expiration Date:   04/12/2018    Order Specific Question:   Reason for Exam (SYMPTOM  OR DIAGNOSIS REQUIRED)    Answer:   Post menopausal on aromatase inh therapy    Order Specific Question:   Is the patient pregnant?    Answer:   No    Order Specific Question:   Preferred imaging location?    Answer:   [facility]   The patient has a good understanding of the overall plan. she agrees with it. she will call with any problems that may develop before the next visit here.   Rulon Eisenmenger, MD 04/12/17

## 2017-04-19 ENCOUNTER — Encounter: Payer: BLUE CROSS/BLUE SHIELD | Admitting: Physical Therapy

## 2017-04-25 ENCOUNTER — Ambulatory Visit: Payer: BLUE CROSS/BLUE SHIELD | Admitting: Physical Therapy

## 2017-05-09 ENCOUNTER — Ambulatory Visit: Payer: BLUE CROSS/BLUE SHIELD | Attending: Plastic Surgery | Admitting: Physical Therapy

## 2017-05-09 DIAGNOSIS — R609 Edema, unspecified: Secondary | ICD-10-CM | POA: Insufficient documentation

## 2017-05-09 DIAGNOSIS — M25611 Stiffness of right shoulder, not elsewhere classified: Secondary | ICD-10-CM | POA: Diagnosis not present

## 2017-05-09 DIAGNOSIS — M25511 Pain in right shoulder: Secondary | ICD-10-CM | POA: Diagnosis present

## 2017-05-09 DIAGNOSIS — R222 Localized swelling, mass and lump, trunk: Secondary | ICD-10-CM | POA: Insufficient documentation

## 2017-05-09 NOTE — Patient Instructions (Addendum)
(  Outpatient Rehab [phone])   (1) ABDUCTION: Standing - Stable (Active)    Standing, arms at sides. Raise right arm out to side and up as high as possible, keeping elbow straight. Use __0_ lbs.  Then tilt your body over to the left, "I'm a little teapot"-style. Hold 5 counts. Complete _1_ sets of _10__ repetitions. Perform _1-2__ sessions per day.  Copyright  VHI. All rights reserved.  (2) BACK: Child's Pose    Sit in knee-chest position and reach arms forward. Separate knees for comfort. Hold position for __30_ seconds. Repeat _1__ times. Then "walk" your hands over to the left and hold for 30 seconds, feeling a stretch at right side. Do _1-2__ times per day.  Copyright  VHI. All rights reserved.    (3) Roll up a towel and place it on the bed or the floor near the right edge if on the bed. Lie down with your spine on top of the towel, bending knees and placing feet flat for stability.  A) Spread arms straight out to the sides until you feel a stretch.  Hold for 30 seconds.  Then do 10 reps of the motion, slowly, with a slight pause at the stretch point. B) Raise arms up and out in a "V" position and follow instructions for (A). C) With arms resting alongside your body, palms up, press arms back and squeeze shoulder blades as if you're trying to make an impression of your arms on a sandy beach.  Hold 3 counts and do 10 reps.  Do A,B,C 1-2 times a day. Do these

## 2017-05-09 NOTE — Therapy (Signed)
[place], [place], [address] Phone: [phone]   Fax:  [phone]  Physical Therapy Treatment  Patient Details  Name: Kiara Spencer MRN: [id number] Date of Birth: 1971/05/23 Referring Provider: Dr. Marla Roe  Encounter Date: 05/09/2017      PT End of Session - 05/09/17 1659    Visit Number 3   Number of Visits 9   Date for PT Re-Evaluation 07/01/17   PT Start Time 1604   PT Stop Time 1647   PT Time Calculation (min) 43 min   Activity Tolerance Patient tolerated treatment well   Behavior During Therapy [facility] for tasks assessed/performed      Past Medical History:  Diagnosis Date  . Breast cancer ([place])   . Breast cancer of upper-outer quadrant of right female breast ([place]) 12/02/2015  . Headache    hx migraines    Past Surgical History:  Procedure Laterality Date  . BREAST RECONSTRUCTION WITH PLACEMENT OF TISSUE EXPANDER AND FLEX HD (ACELLULAR HYDRATED DERMIS) Bilateral 02/05/2016   Procedure: BILATERAL BREAST RECONSTRUCTION WITH PLACEMENT OF TISSUE EXPANDER AND FLEX HD (ACELLULAR HYDRATED DERMIS);  Surgeon: Loel Lofty Dillingham, DO;  Location: [place];  Service: Plastics;  Laterality: Bilateral;  . CESAREAN SECTION  x 2  . DILATION AND CURETTAGE OF UTERUS    . MASTECTOMY W/ SENTINEL NODE BIOPSY Right 02/05/2016   Procedure: RIGHT NIPPLE SPARING MASTECTOMY WITH RIGHT AXILLARY SENTINEL LYMPH NODE BIOPSY;  Surgeon: [name], MD;  Location: [place];  Service: General;  Laterality: Right;  . MASTECTOMY, PARTIAL Left 02/05/2016   Procedure: LEFT PROPHYLACTIC NIPPLE SPARING MASTECTOMY ;  Surgeon: [name], MD;  Location: [place];  Service: General;  Laterality: Left;  . PERIPHERAL VASCULAR CATHETERIZATION Right 09/16/2016   Procedure: PORTA CATH REMOVAL;  Surgeon: Wallace Going, DO;  Location: [place];  Service: Plastics;  Laterality: Right;  . PORTACATH PLACEMENT Right  03/02/2016   Procedure: INSERTION PORT-A-CATH WITH ULTRASOUND;  Surgeon: [name], MD;  Location: [place];  Service: General;  Laterality: Right;  IJ  . REMOVAL OF BILATERAL TISSUE EXPANDERS WITH PLACEMENT OF BILATERAL BREAST IMPLANTS Bilateral 09/16/2016   Procedure: REMOVAL OF BILATERAL TISSUE EXPANDERS WITH PLACEMENT OF BILATERAL BREAST IMPLANTS;  Surgeon: Wallace Going, DO;  Location: [place];  Service: Plastics;  Laterality: Bilateral;    There were no vitals filed for this visit.      Subjective Assessment - 05/09/17 1606    Subjective "Just winding down the school year.  Staying busy." Has been doing home exercises and that's still very helpful.   Currently in Pain? No/denies            [facility] PT Assessment - 05/09/17 0001      AROM   Right Shoulder Flexion 130 Degrees   Right Shoulder ABduction 150 Degrees   Right Shoulder External Rotation 68 Degrees                     [facility] Adult PT Treatment/Exercise - 05/09/17 0001      Shoulder Exercises: Supine   Horizontal ABduction AROM;Both  30 second hold x 1; then active x 10 over towel roll   Other Supine Exercises supine over towel roll, active D2 bilat. x 10   Other Supine Exercises supine over towel roll, isometric shoulder extension with scapular retraction, 3 count holds x 10     Shoulder Exercises: Prone   Other Prone Exercises  child's pose x 30 seconds, then child's pose moving hands to left x 30 seconds     Shoulder Exercises: Standing   Flexion Right;Left;5 reps  with dowel for stretching   ABduction Right;Left;5 reps  with dowel for stretching   Other Standing Exercises wall walking for abduction x 5   Other Standing Exercises right shoulder abduction with left sidebend, 10 counts x 3     Shoulder Exercises: Pulleys   Flexion 2 minutes   ABduction 2 minutes     Shoulder Exercises: Therapy Ball   Flexion 10 reps  stretch at top                 PT Education - 05/09/17 1657    Education provided Yes   Education Details child's pose, child's pose with stretch to left; standing shoulder abduction with trunk lean; supine over towel roll horiz. abduction and "V" stretches, isometric shoulder extension with scapular retraction   Person(s) Educated Patient   Methods Explanation;Demonstration;Verbal cues;Handout   Comprehension Verbalized understanding;Returned demonstration                [facility] Goals - 05/09/17 1607      CC Long Term Goal  #1   Title Pain with reaching with right UE reduced at least 60% for improved ADLs.   Baseline 65% improved as of 05/09/17   Status Achieved     CC Long Term Goal  #2   Title Right shoulder active flexion to at least 150 degrees for improved overhead reach.   Status On-going     CC Long Term Goal  #3   Title Right shoulder active abduction to at least 160 degrees for improved ADLs   Baseline 150 as of 05/09/17   Status Partially Met     CC Long Term Goal  #4   Title Right shoulder external rotation to at least 85 degrees for improved ADLs.   Status On-going     CC Long Term Goal  #5   Title Independent with HEP for ROM and strengthening.   Status Partially Met            Plan - 05/09/17 1700    Clinical Impression Statement Patient felt like she got some good stretches today with things added to HEP but also with pulleys and physioball up wall.  She has had a significant decrease in discomfort with using the right arm and has improved abduction, but still has tightness in the pect muscle and limited flexion and rotation. She has only come twice since initial evaluation due to her school schedule.   Rehab Potential Good   Clinical Impairments Affecting Rehab Potential 11 nodes removed    PT Frequency 2x / week   PT Duration 4 weeks   PT Treatment/Interventions ADLs/Self Care Home Management;Electrical Stimulation;DME Instruction;Therapeutic  exercise;Patient/family education;Manual techniques;Manual lymph drainage;Scar mobilization;Passive range of motion;Taping   PT Next Visit Plan Progress HEP for bilateral shoulder ROM.  Check benefit of foam pad.  Continue manual therapies.  Consider strength ABC program.   PT Home Exercise Plan see education section   Consulted and Agree with Plan of Care Patient      Patient will benefit from skilled therapeutic intervention in order to improve the following deficits and impairments:  Decreased range of motion, Increased fascial restricitons, Impaired UE functional use, Increased muscle spasms, Decreased knowledge of precautions, Decreased skin integrity, Pain, Decreased knowledge of use of DME, Decreased strength, Increased edema  Visit Diagnosis: Stiffness  of right shoulder, not elsewhere classified - Plan: PT plan of care cert/re-cert  Acute pain of right shoulder - Plan: PT plan of care cert/re-cert  Localized swelling, mass and lump, trunk - Plan: PT plan of care cert/re-cert  Postoperative edema - Plan: PT plan of care cert/re-cert     Problem List Patient Active Problem List   Diagnosis Date Noted  . Hypersensitivity reaction 03/16/2016  . Other iron deficiency anemia 02/16/2016  . Genetic testing 12/22/2015  . Family history of cancer 12/22/2015  . Family history of breast cancer in mother 12/11/2015  . Breast cancer of upper-outer quadrant of right female breast ([place]) 12/02/2015    Emileigh Kellett 05/09/2017, 5:04 PM  [place], [place], [address] Phone: [phone]   Fax:  [phone]  Name: Evyn Kooyman MRN: [id number] Date of Birth: Dec 28, 1970  Serafina Royals, PT 05/09/17 5:04 PM

## 2017-05-16 ENCOUNTER — Ambulatory Visit: Payer: BLUE CROSS/BLUE SHIELD | Admitting: Physical Therapy

## 2017-05-16 DIAGNOSIS — M25511 Pain in right shoulder: Secondary | ICD-10-CM

## 2017-05-16 DIAGNOSIS — R222 Localized swelling, mass and lump, trunk: Secondary | ICD-10-CM

## 2017-05-16 DIAGNOSIS — M25611 Stiffness of right shoulder, not elsewhere classified: Secondary | ICD-10-CM | POA: Diagnosis not present

## 2017-05-16 DIAGNOSIS — R609 Edema, unspecified: Secondary | ICD-10-CM

## 2017-05-16 NOTE — Therapy (Signed)
[place], [place], [address] Phone: [phone]   Fax:  [phone]  Physical Therapy Treatment  Patient Details  Name: Kiara Spencer MRN: [id number] Date of Birth: 04/02/1971 Referring Provider: Dr. Marla Roe  Encounter Date: 05/16/2017      PT End of Session - 05/16/17 1707    Visit Number 4   Number of Visits 9   Date for PT Re-Evaluation 07/01/17   PT Start Time 1608   PT Stop Time 1653   PT Time Calculation (min) 45 min   Activity Tolerance Patient tolerated treatment well;Patient limited by pain   Behavior During Therapy [facility] for tasks assessed/performed      Past Medical History:  Diagnosis Date  . Breast cancer ([place])   . Breast cancer of upper-outer quadrant of right female breast ([place]) 12/02/2015  . Headache    hx migraines    Past Surgical History:  Procedure Laterality Date  . BREAST RECONSTRUCTION WITH PLACEMENT OF TISSUE EXPANDER AND FLEX HD (ACELLULAR HYDRATED DERMIS) Bilateral 02/05/2016   Procedure: BILATERAL BREAST RECONSTRUCTION WITH PLACEMENT OF TISSUE EXPANDER AND FLEX HD (ACELLULAR HYDRATED DERMIS);  Surgeon: Loel Lofty Dillingham, DO;  Location: [place];  Service: Plastics;  Laterality: Bilateral;  . CESAREAN SECTION  x 2  . DILATION AND CURETTAGE OF UTERUS    . MASTECTOMY W/ SENTINEL NODE BIOPSY Right 02/05/2016   Procedure: RIGHT NIPPLE SPARING MASTECTOMY WITH RIGHT AXILLARY SENTINEL LYMPH NODE BIOPSY;  Surgeon: [name], MD;  Location: [place];  Service: General;  Laterality: Right;  . MASTECTOMY, PARTIAL Left 02/05/2016   Procedure: LEFT PROPHYLACTIC NIPPLE SPARING MASTECTOMY ;  Surgeon: [name], MD;  Location: [place];  Service: General;  Laterality: Left;  . PERIPHERAL VASCULAR CATHETERIZATION Right 09/16/2016   Procedure: PORTA CATH REMOVAL;  Surgeon: Wallace Going, DO;  Location: [place];  Service: Plastics;  Laterality: Right;  .  PORTACATH PLACEMENT Right 03/02/2016   Procedure: INSERTION PORT-A-CATH WITH ULTRASOUND;  Surgeon: [name], MD;  Location: [place];  Service: General;  Laterality: Right;  IJ  . REMOVAL OF BILATERAL TISSUE EXPANDERS WITH PLACEMENT OF BILATERAL BREAST IMPLANTS Bilateral 09/16/2016   Procedure: REMOVAL OF BILATERAL TISSUE EXPANDERS WITH PLACEMENT OF BILATERAL BREAST IMPLANTS;  Surgeon: Wallace Going, DO;  Location: [place];  Service: Plastics;  Laterality: Bilateral;    There were no vitals filed for this visit.      Subjective Assessment - 05/16/17 1611    Subjective Nothing new.  Did the new exercises.  I felt like I had a little bit better ROM lifting in my cabinets and things like that.  I fell yesterday, so now I'm sore. Slid all the way down the stairs.  She has a bruise on left elbow and a swollen spot on the left forearm. Thinks she's coming down with a cold.   Currently in Pain? Yes   Pain Score 1    Pain Location Shoulder   Pain Orientation Right;Posterior   Pain Descriptors / Indicators Sore   Aggravating Factors  when she slid down the stairs   Pain Relieving Factors hasn't tried anything            [facility] PT Assessment - 05/16/17 0001      AROM   Right Shoulder Flexion 135 Degrees  in standing today   Right Shoulder ABduction 126 Degrees  sore from a fall she took yesterday   Right Shoulder External  Rotation 80 Degrees                     [facility] Adult PT Treatment/Exercise - 05/16/17 0001      Shoulder Exercises: Standing   ABduction AAROM;Right;5 reps  at wall finger ladder   ABduction Limitations #23 on finger ladder     Shoulder Exercises: Pulleys   Flexion 2 minutes   ABduction 2 minutes     Shoulder Exercises: Therapy Ball   Flexion 10 reps     Manual Therapy   Myofascial Release right UE myofascial pulling with movement into abduction where not painful   Passive ROM in supine, P/ROM with  stretch to tolerance for right shoulder ir, er, abduction, and flexion; same for left  right abduction limited by pain                        [facility] Term Clinic Goals - 05/16/17 1707      CC Long Term Goal  #4   Title Right shoulder external rotation to at least 85 degrees for improved ADLs.   Baseline 80 degrees passively as of 05/16/17.   Status Partially Met            Plan - 05/16/17 1616    Clinical Impression Statement Foam pad given some time ago did help. Pt. fell down a flight of stairs yesterday and wasn't seriously hurt, but did have bruises and soreness in right shoulder and forearm and in left elbow from this.  This limited her ROM today; there were still some improvements from last measurement, but movements were limited by pain at right shoulder. She does feel like she has enough home exercises for now.   Rehab Potential Good   Clinical Impairments Affecting Rehab Potential 11 nodes removed    PT Frequency 2x / week   PT Duration 4 weeks   PT Treatment/Interventions ADLs/Self Care Home Management;Electrical Stimulation;DME Instruction;Therapeutic exercise;Patient/family education;Manual techniques;Manual lymph drainage;Scar mobilization;Passive range of motion;Taping   PT Next Visit Plan Progress HEP for bilateral shoulder ROM prn.  Continue manual therapies.  Consider strength ABC program.   Consulted and Agree with Plan of Care Patient      Patient will benefit from skilled therapeutic intervention in order to improve the following deficits and impairments:  Decreased range of motion, Increased fascial restricitons, Impaired UE functional use, Increased muscle spasms, Decreased knowledge of precautions, Decreased skin integrity, Pain, Decreased knowledge of use of DME, Decreased strength, Increased edema  Visit Diagnosis: Stiffness of right shoulder, not elsewhere classified  Acute pain of right shoulder  Localized swelling, mass and lump,  trunk  Postoperative edema     Problem List Patient Active Problem List   Diagnosis Date Noted  . Hypersensitivity reaction 03/16/2016  . Other iron deficiency anemia 02/16/2016  . Genetic testing 12/22/2015  . Family history of cancer 12/22/2015  . Family history of breast cancer in mother 12/11/2015  . Breast cancer of upper-outer quadrant of right female breast ([place]) 12/02/2015    SALISBUR[place],DONNA 05/16/2017, 5:11 PM  [place], [place], [address] Phone: [phone]   Fax:  [phone]  Name: Kiara Spencer MRN: [id number] Date of Birth: 1971-03-03  Serafina Royals, PT 05/16/17 5:11 PM

## 2017-05-23 ENCOUNTER — Ambulatory Visit: Payer: BLUE CROSS/BLUE SHIELD | Admitting: Physical Therapy

## 2017-05-30 ENCOUNTER — Ambulatory Visit: Payer: BLUE CROSS/BLUE SHIELD | Admitting: Physical Therapy

## 2017-05-30 DIAGNOSIS — M25611 Stiffness of right shoulder, not elsewhere classified: Secondary | ICD-10-CM | POA: Diagnosis not present

## 2017-05-30 DIAGNOSIS — M25511 Pain in right shoulder: Secondary | ICD-10-CM

## 2017-05-30 NOTE — Patient Instructions (Addendum)
You can buy an overdoor pulley set at a medical supply store, or buy parts to make one at a hardware store.  OR  FLEXION: Supine - Elbow Extended (Active)    Lie on back, both arms at sides. Raise right arm up and back over head, keeping elbow straight. Use _0__ lbs. Then alternate arms, raising left arm up as you lower the right arm. Complete _1-2__ sets of _10__ repetitions. Perform _1-2__ sessions per day.  Copyright  VHI. All rights reserved.    ABDUCTION: Side-Lying (Active)    Lie on right side. Lift top arm as high as possible, keeping elbow straight. Use _0__ lbs. Complete _1-2__ sets of _10__ repetitions. Perform __1-2_ sessions per day.  Do each arm.  Copyright  VHI. All rights reserved.    Stand facing a wall at arm's length from wall.  Place hand against wall and "hinge at your hip" to lean forward (keeping back straight) until you feel a GENTLE, TOLERABLE stretch at the shoulder.  Hold for 5 counts and do 5-10 repetitions. Do each arm separately.

## 2017-05-30 NOTE — Therapy (Signed)
[place], [place], [address] Phone: [phone]   Fax:  [phone]  Physical Therapy Treatment  Patient Details  Name: Kiara Spencer MRN: [id number] Date of Birth: [birth year] Referring Provider: Dr. Marla Roe  Encounter Date: 05/30/2017      PT End of Session - 05/30/17 1714    Visit Number 5   Number of Visits 9   Date for PT Re-Evaluation 07/01/17   PT Start Time 1604   PT Stop Time 1654   PT Time Calculation (min) 50 min   Activity Tolerance Patient tolerated treatment well   Behavior During Therapy [facility] for tasks assessed/performed      Past Medical History:  Diagnosis Date  . Breast cancer ([place])   . Breast cancer of upper-outer quadrant of right female breast ([place]) 12/02/2015  . Headache    hx migraines    Past Surgical History:  Procedure Laterality Date  . BREAST RECONSTRUCTION WITH PLACEMENT OF TISSUE EXPANDER AND FLEX HD (ACELLULAR HYDRATED DERMIS) Bilateral 02/05/2016   Procedure: BILATERAL BREAST RECONSTRUCTION WITH PLACEMENT OF TISSUE EXPANDER AND FLEX HD (ACELLULAR HYDRATED DERMIS);  Surgeon: Loel Lofty Dillingham, DO;  Location: [place];  Service: Plastics;  Laterality: Bilateral;  . CESAREAN SECTION  x 2  . DILATION AND CURETTAGE OF UTERUS    . MASTECTOMY W/ SENTINEL NODE BIOPSY Right 02/05/2016   Procedure: RIGHT NIPPLE SPARING MASTECTOMY WITH RIGHT AXILLARY SENTINEL LYMPH NODE BIOPSY;  Surgeon: [name], MD;  Location: [place];  Service: General;  Laterality: Right;  . MASTECTOMY, PARTIAL Left 02/05/2016   Procedure: LEFT PROPHYLACTIC NIPPLE SPARING MASTECTOMY ;  Surgeon: [name], MD;  Location: [place];  Service: General;  Laterality: Left;  . PERIPHERAL VASCULAR CATHETERIZATION Right 09/16/2016   Procedure: PORTA CATH REMOVAL;  Surgeon: Wallace Going, DO;  Location: [place];  Service: Plastics;  Laterality: Right;  . PORTACATH PLACEMENT Right  03/02/2016   Procedure: INSERTION PORT-A-CATH WITH ULTRASOUND;  Surgeon: [name], MD;  Location: [place];  Service: General;  Laterality: Right;  IJ  . REMOVAL OF BILATERAL TISSUE EXPANDERS WITH PLACEMENT OF BILATERAL BREAST IMPLANTS Bilateral 09/16/2016   Procedure: REMOVAL OF BILATERAL TISSUE EXPANDERS WITH PLACEMENT OF BILATERAL BREAST IMPLANTS;  Surgeon: Wallace Going, DO;  Location: [place];  Service: Plastics;  Laterality: Bilateral;    There were no vitals filed for this visit.      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  On letrozole since late January.  Had 11 lymph nodes removed on the right, one positive.     Currently in Pain? No/denies            [facility] PT Assessment - 05/30/17 0001      AROM   Right Shoulder Flexion 125 Degrees  prior to stretching; 135 after   Right Shoulder ABduction 112 Degrees   Left Shoulder Flexion 138 Degrees  prior to stretching; 150 after   Left Shoulder ABduction 156 Degrees                     [facility] Adult PT Treatment/Exercise - 05/30/17 0001  Shoulder Exercises: Supine   Horizontal ABduction AROM;Both;10 reps  over foam roller; also with one 30 second hold   Flexion AROM;Right;Left;10 reps  alterating, over foam roller   Other Supine Exercises supine over foam roller, active bilat. D2 x 10     Shoulder Exercises: Standing   ABduction AAROM;Right;Left;10 reps  with finger ladder     Shoulder Exercises: Pulleys   Flexion 2 minutes   ABduction 2 minutes     Shoulder Exercises: Therapy  Ball   Flexion 10 reps  stretch at top                PT Education - 05/30/17 1713    Education provided Yes   Education Details added supine alternating shoulder active flexion and sidelying shoulder abduction as well as modified child's pose to HEP   Person(s) Educated Patient   Methods Explanation;Demonstration;Handout;Verbal cues   Comprehension Verbalized understanding;Returned demonstration                [facility] Goals - 05/16/17 1707      CC Long Term Goal  #4   Title Right shoulder external rotation to at least 85 degrees for improved ADLs.   Baseline 80 degrees passively as of 05/16/17.   Status Partially Met            Plan - 05/30/17 1714    Clinical Impression Statement Patient has lost a bit of A/ROM from prior sessions as she didn't do enough stretching over the past week. However, measurements taken before and then after stretching during this session showed approx. 10 degree gains during session.  Patient asked about feeling good after her therapy sessions and after stretching at home, then losing gains; she was told she will need to be vigilant about stretching over the next weeks to months to ensure that she will make forward progress.   Rehab Potential Good   Clinical Impairments Affecting Rehab Potential 11 nodes removed    PT Duration 4 weeks   PT Treatment/Interventions ADLs/Self Care Home Management;Electrical Stimulation;DME Instruction;Therapeutic exercise;Patient/family education;Manual techniques;Manual lymph drainage;Scar mobilization;Passive range of motion;Taping   PT Next Visit Plan Progress HEP for bilateral shoulder ROM prn.  Continue manual therapies.  Consider strength ABC program.   PT Home Exercise Plan see education section      Patient will benefit from skilled therapeutic intervention in order to improve the following deficits and impairments:  Decreased range of motion, Increased fascial restricitons, Impaired UE  functional use, Increased muscle spasms, Decreased knowledge of precautions, Decreased skin integrity, Pain, Decreased knowledge of use of DME, Decreased strength, Increased edema  Visit Diagnosis: Stiffness of right shoulder, not elsewhere classified  Acute pain of right shoulder     Problem List Patient Active Problem List   Diagnosis Date Noted  . Hypersensitivity reaction 03/16/2016  . Other iron deficiency anemia 02/16/2016  . Genetic testing 12/22/2015  . Family history of cancer 12/22/2015  . Family history of breast cancer in mother 12/11/2015  . Breast cancer of upper-outer quadrant of right female breast ([place]) 12/02/2015    B[facility] Cortez 05/30/2017, 5:18 PM  [place], [place], [address] Phone: [phone]   Fax:  [phone]  Name: Jaspreet Bodner MRN: [id number] Date of Birth: Apr 15, 1971  Serafina Royals, PT 05/30/17 5:18 PM

## 2017-06-01 ENCOUNTER — Ambulatory Visit: Payer: BLUE CROSS/BLUE SHIELD

## 2017-06-06 ENCOUNTER — Ambulatory Visit: Payer: BLUE CROSS/BLUE SHIELD | Admitting: Physical Therapy

## 2017-06-13 ENCOUNTER — Encounter: Payer: BLUE CROSS/BLUE SHIELD | Admitting: Physical Therapy

## 2017-06-13 ENCOUNTER — Ambulatory Visit: Payer: BLUE CROSS/BLUE SHIELD | Admitting: Radiation Oncology

## 2017-06-21 ENCOUNTER — Ambulatory Visit: Payer: BLUE CROSS/BLUE SHIELD | Admitting: Radiation Oncology

## 2017-06-24 ENCOUNTER — Other Ambulatory Visit: Payer: Self-pay | Admitting: Hematology and Oncology

## 2017-06-24 ENCOUNTER — Telehealth: Payer: Self-pay | Admitting: *Deleted

## 2017-06-24 ENCOUNTER — Telehealth: Payer: Self-pay | Admitting: Hematology and Oncology

## 2017-06-24 DIAGNOSIS — C50411 Malignant neoplasm of upper-outer quadrant of right female breast: Secondary | ICD-10-CM

## 2017-06-24 DIAGNOSIS — Z170 Estrogen receptor positive status [ER+]: Principal | ICD-10-CM

## 2017-06-24 NOTE — Telephone Encounter (Signed)
I got a phone call from Dr. Elisabeth Cara that Kiara Spencer was experiencing chest wall pain. I called [facility] and discussed with her. It appears that she is having a lot of tightness related to the implant. The pain is making it difficult for her to sleep or move around. I recommended that we obtain an MRI of the breast evaluate this further. I would like to see her back after the breast MRI to discuss the results.

## 2017-06-24 NOTE — Telephone Encounter (Signed)
Per Dr. Lindi Adie breast MRI ordered and scheduled for 7/25 at 6:45pm. R/s appt with Dr. Lindi Adie to 07/01/17 at 1215. Left detailed message for pt and request return call to verify appts. Contact information provided.

## 2017-06-24 NOTE — Telephone Encounter (Signed)
Received call from Dr. Eusebio Friendly office requesting return appt with Dr. Lindi Adie for c/o bilateral chest wall pain. Scheduled and confirmed appt with Dr. Lindi Adie on 7/23 at 0915.

## 2017-06-27 ENCOUNTER — Ambulatory Visit: Payer: BLUE CROSS/BLUE SHIELD | Admitting: Hematology and Oncology

## 2017-06-28 ENCOUNTER — Ambulatory Visit: Payer: BLUE CROSS/BLUE SHIELD | Admitting: Physical Therapy

## 2017-06-29 ENCOUNTER — Ambulatory Visit: Payer: BLUE CROSS/BLUE SHIELD | Attending: Radiation Oncology | Admitting: Radiation Oncology

## 2017-06-29 ENCOUNTER — Ambulatory Visit (HOSPITAL_COMMUNITY)
Admission: RE | Admit: 2017-06-29 | Discharge: 2017-06-29 | Disposition: A | Discharge status: Home / Self Care | Payer: BLUE CROSS/BLUE SHIELD | Source: Ambulatory Visit | Attending: Hematology and Oncology | Admitting: Hematology and Oncology

## 2017-06-29 DIAGNOSIS — C50411 Malignant neoplasm of upper-outer quadrant of right female breast: Secondary | ICD-10-CM

## 2017-06-29 DIAGNOSIS — Z9013 Acquired absence of bilateral breasts and nipples: Secondary | ICD-10-CM | POA: Insufficient documentation

## 2017-06-29 DIAGNOSIS — Z853 Personal history of malignant neoplasm of breast: Secondary | ICD-10-CM | POA: Insufficient documentation

## 2017-06-29 DIAGNOSIS — R0789 Other chest pain: Secondary | ICD-10-CM | POA: Diagnosis present

## 2017-06-29 DIAGNOSIS — Z170 Estrogen receptor positive status [ER+]: Secondary | ICD-10-CM

## 2017-06-29 DIAGNOSIS — N6489 Other specified disorders of breast: Secondary | ICD-10-CM | POA: Diagnosis not present

## 2017-06-29 MED ORDER — GADOBENATE DIMEGLUMINE 529 MG/ML IV SOLN
10.0000 mL | Freq: Once | INTRAVENOUS | Status: AC | PRN
  Administered 2017-06-29: 10 mL via INTRAVENOUS

## 2017-06-30 ENCOUNTER — Other Ambulatory Visit: Payer: Self-pay | Admitting: Hematology and Oncology

## 2017-06-30 ENCOUNTER — Other Ambulatory Visit: Payer: Self-pay | Admitting: *Deleted

## 2017-06-30 DIAGNOSIS — R928 Other abnormal and inconclusive findings on diagnostic imaging of breast: Secondary | ICD-10-CM

## 2017-06-30 DIAGNOSIS — C50411 Malignant neoplasm of upper-outer quadrant of right female breast: Secondary | ICD-10-CM

## 2017-07-01 ENCOUNTER — Ambulatory Visit: Payer: BLUE CROSS/BLUE SHIELD | Admitting: Hematology and Oncology

## 2017-07-05 ENCOUNTER — Other Ambulatory Visit: Payer: BLUE CROSS/BLUE SHIELD

## 2017-07-05 ENCOUNTER — Ambulatory Visit: Payer: BLUE CROSS/BLUE SHIELD | Attending: Plastic Surgery | Admitting: Physical Therapy

## 2017-07-05 DIAGNOSIS — M25511 Pain in right shoulder: Secondary | ICD-10-CM | POA: Insufficient documentation

## 2017-07-05 DIAGNOSIS — R222 Localized swelling, mass and lump, trunk: Secondary | ICD-10-CM | POA: Diagnosis present

## 2017-07-05 DIAGNOSIS — M25612 Stiffness of left shoulder, not elsewhere classified: Secondary | ICD-10-CM | POA: Diagnosis present

## 2017-07-05 DIAGNOSIS — M25611 Stiffness of right shoulder, not elsewhere classified: Secondary | ICD-10-CM | POA: Diagnosis present

## 2017-07-05 NOTE — Therapy (Signed)
[place], [place], [address] Phone: [phone]   Fax:  [phone]  Physical Therapy Treatment  Patient Details  Name: Kiara Spencer MRN: [id number] Date of Birth: 1971-08-04 Referring Provider: Dr. Marla Roe  Encounter Date: 07/05/2017      PT End of Session - 07/05/17 1654    Visit Number 6   Number of Visits 17   Date for PT Re-Evaluation 08/05/17   PT Start Time 1600   PT Stop Time 1645   PT Time Calculation (min) 45 min   Activity Tolerance Patient tolerated treatment well   Behavior During Therapy [facility] for tasks assessed/performed      Past Medical History:  Diagnosis Date  . Breast cancer ([place])   . Breast cancer of upper-outer quadrant of right female breast ([place]) 12/02/2015  . Headache    hx migraines    Past Surgical History:  Procedure Laterality Date  . BREAST RECONSTRUCTION WITH PLACEMENT OF TISSUE EXPANDER AND FLEX HD (ACELLULAR HYDRATED DERMIS) Bilateral 02/05/2016   Procedure: BILATERAL BREAST RECONSTRUCTION WITH PLACEMENT OF TISSUE EXPANDER AND FLEX HD (ACELLULAR HYDRATED DERMIS);  Surgeon: Loel Lofty Dillingham, DO;  Location: [place];  Service: Plastics;  Laterality: Bilateral;  . CESAREAN SECTION  x 2  . DILATION AND CURETTAGE OF UTERUS    . MASTECTOMY W/ SENTINEL NODE BIOPSY Right 02/05/2016   Procedure: RIGHT NIPPLE SPARING MASTECTOMY WITH RIGHT AXILLARY SENTINEL LYMPH NODE BIOPSY;  Surgeon: [name], MD;  Location: [place];  Service: General;  Laterality: Right;  . MASTECTOMY, PARTIAL Left 02/05/2016   Procedure: LEFT PROPHYLACTIC NIPPLE SPARING MASTECTOMY ;  Surgeon: [name], MD;  Location: [place];  Service: General;  Laterality: Left;  . PERIPHERAL VASCULAR CATHETERIZATION Right 09/16/2016   Procedure: PORTA CATH REMOVAL;  Surgeon: Wallace Going, DO;  Location: [place];  Service: Plastics;  Laterality: Right;  . PORTACATH PLACEMENT Right  03/02/2016   Procedure: INSERTION PORT-A-CATH WITH ULTRASOUND;  Surgeon: [name], MD;  Location: [place];  Service: General;  Laterality: Right;  IJ  . REMOVAL OF BILATERAL TISSUE EXPANDERS WITH PLACEMENT OF BILATERAL BREAST IMPLANTS Bilateral 09/16/2016   Procedure: REMOVAL OF BILATERAL TISSUE EXPANDERS WITH PLACEMENT OF BILATERAL BREAST IMPLANTS;  Surgeon: Wallace Going, DO;  Location: [place];  Service: Plastics;  Laterality: Bilateral;    There were no vitals filed for this visit.      Subjective Assessment - 07/05/17 1609    Subjective Pt states she has been travelling and has been following her home program. She continues to still feel discomfort from her implants and had an MRI last week. She will follow up with Dr. Dalbert Batman next to try find out what the pain is about and why they are so hard.    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  On letrozole since late January.  Had 11 lymph nodes removed on the right, one positive.     Patient Stated Goals for the tightening to go away   Currently in Pain? No/denies  just tightness             [facility] PT Assessment - 07/05/17 0001      AROM   Right Shoulder Flexion 140 Degrees   Right Shoulder ABduction 166 Degrees  Left Shoulder Flexion 156 Degrees   Left Shoulder ABduction 170 Degrees     Palpation   Palpation comment tightness palpated at left pec major tendon pt still feels numb so not able to assess tenderness                      [facility] Adult PT Treatment/Exercise - 07/05/17 0001      Shoulder Exercises: Supine   Protraction AROM;10 reps     Shoulder Exercises: Sidelying   ABduction AROM;Right;5 reps   Other Sidelying  Exercises horizontal abduction with cervicl and thoracic rotation toward the right      Manual Therapy   Myofascial Release to right anterior chest and axilla  extending to upper arm   Passive ROM in supine, P/ROM with stretch to tolerance for right shoulder ir, er, abduction, and flexion;  right abduction limited by pain                        [facility] Goals - 07/05/17 1658      CC Long Term Goal  #1   Title Pain with reaching with right UE reduced at least 60% for improved ADLs.   Baseline 65% improved as of 05/09/17   Status Achieved     CC Long Term Goal  #2   Title Right shoulder active flexion to at least 150 degrees for improved overhead reach.   Baseline 140 degrees on 07/05/2017   Time 4   Period Weeks   Status On-going     CC Long Term Goal  #3   Title Right shoulder active abduction to at least 160 degrees for improved ADLs   Baseline 150 as of 05/09/17, 166 degrees on 07/05/2017   Status Achieved     CC Long Term Goal  #4   Title Right shoulder external rotation to at least 85 degrees for improved ADLs.   Baseline 80 degrees passively as of 05/16/17.   Time 4   Period Weeks   Status On-going     CC Long Term Goal  #5   Title Independent with HEP for ROM and strengthening.   Time 4   Period Weeks   Status On-going            Plan - 07/05/17 1654    Clinical Impression Statement Pt continues to feel tightness across chest at implants and especially in area of left pec major.  She has improved in range of motion since last visit here.  She would benefit from strengthening exercises and will need to get a prophylactic sleeve and gauntlet for right arm She wants to continue PT to try to get relief from tightness and increase right shoulder range of motion    Rehab Potential Good   Clinical Impairments Affecting Rehab Potential 11 nodes removed    PT Frequency 2x / week   PT Duration 4 weeks   PT Treatment/Interventions ADLs/Self Care Home  Management;Electrical Stimulation;DME Instruction;Therapeutic exercise;Patient/family education;Manual techniques;Manual lymph drainage;Scar mobilization;Passive range of motion;Taping   PT Next Visit Plan Progress HEP for bilateral shoulder ROM prn.  Continue manual therapies., especially myofascial release   Teach  strength ABC program. Facilaitate pt getting a compression sleeve and gauntlet    Consulted and Agree with Plan of Care Patient      Patient will benefit from skilled therapeutic intervention in order to improve the following deficits and impairments:  Decreased range of motion, Increased fascial restricitons,  Impaired UE functional use, Increased muscle spasms, Decreased knowledge of precautions, Decreased skin integrity, Pain, Decreased knowledge of use of DME, Decreased strength, Increased edema  Visit Diagnosis: Stiffness of right shoulder, not elsewhere classified - Plan: PT plan of care cert/re-cert  Acute pain of right shoulder - Plan: PT plan of care cert/re-cert  Localized swelling, mass and lump, trunk - Plan: PT plan of care cert/re-cert  Stiffness of joint, shoulder region, left - Plan: PT plan of care cert/re-cert     Problem List Patient Active Problem List   Diagnosis Date Noted  . Hypersensitivity reaction 03/16/2016  . Other iron deficiency anemia 02/16/2016  . Genetic testing 12/22/2015  . Family history of cancer 12/22/2015  . Family history of breast cancer in mother 12/11/2015  . Breast cancer of upper-outer quadrant of right female breast ([place]) 12/02/2015   Donato Heinz. Owens Shark PT  [facility] Levo 07/05/2017, 5:03 PM  [place], [place], [address] Phone: [phone]   Fax:  [phone]  Name: Kiara Spencer MRN: [id number] Date of Birth: 1971/03/12

## 2017-07-07 ENCOUNTER — Encounter: Payer: BLUE CROSS/BLUE SHIELD | Admitting: Adult Health

## 2017-07-13 ENCOUNTER — Ambulatory Visit: Payer: BLUE CROSS/BLUE SHIELD | Attending: Plastic Surgery

## 2017-07-13 DIAGNOSIS — M25612 Stiffness of left shoulder, not elsewhere classified: Secondary | ICD-10-CM | POA: Insufficient documentation

## 2017-07-13 DIAGNOSIS — M25611 Stiffness of right shoulder, not elsewhere classified: Secondary | ICD-10-CM | POA: Diagnosis present

## 2017-07-13 DIAGNOSIS — R609 Edema, unspecified: Secondary | ICD-10-CM | POA: Diagnosis present

## 2017-07-13 DIAGNOSIS — M25511 Pain in right shoulder: Secondary | ICD-10-CM | POA: Insufficient documentation

## 2017-07-13 DIAGNOSIS — R222 Localized swelling, mass and lump, trunk: Secondary | ICD-10-CM

## 2017-07-13 DIAGNOSIS — Z9189 Other specified personal risk factors, not elsewhere classified: Secondary | ICD-10-CM | POA: Insufficient documentation

## 2017-07-13 NOTE — Therapy (Signed)
[place], [place], [address] Phone: [phone]   Fax:  [phone]  Physical Therapy Treatment  Patient Details  Name: Kiara Spencer MRN: [id number] Date of Birth: [birth year] Referring Provider: Dr. Marla Roe  Encounter Date: 07/13/2017      PT End of Session - 07/13/17 1735    Visit Number 7   Number of Visits 17   Date for PT Re-Evaluation 08/05/17   PT Start Time 8144   PT Stop Time 1652   PT Time Calculation (min) 56 min   Activity Tolerance Patient tolerated treatment well   Behavior During Therapy [facility] for tasks assessed/performed      Past Medical History:  Diagnosis Date  . Breast cancer ([place])   . Breast cancer of upper-outer quadrant of right female breast ([place]) 12/02/2015  . Headache    hx migraines    Past Surgical History:  Procedure Laterality Date  . BREAST RECONSTRUCTION WITH PLACEMENT OF TISSUE EXPANDER AND FLEX HD (ACELLULAR HYDRATED DERMIS) Bilateral 02/05/2016   Procedure: BILATERAL BREAST RECONSTRUCTION WITH PLACEMENT OF TISSUE EXPANDER AND FLEX HD (ACELLULAR HYDRATED DERMIS);  Surgeon: Loel Lofty Dillingham, DO;  Location: [place];  Service: Plastics;  Laterality: Bilateral;  . CESAREAN SECTION  x 2  . DILATION AND CURETTAGE OF UTERUS    . MASTECTOMY W/ SENTINEL NODE BIOPSY Right 02/05/2016   Procedure: RIGHT NIPPLE SPARING MASTECTOMY WITH RIGHT AXILLARY SENTINEL LYMPH NODE BIOPSY;  Surgeon: [name], MD;  Location: [place];  Service: General;  Laterality: Right;  . MASTECTOMY, PARTIAL Left 02/05/2016   Procedure: LEFT PROPHYLACTIC NIPPLE SPARING MASTECTOMY ;  Surgeon: [name], MD;  Location: [place];  Service: General;  Laterality: Left;  . PERIPHERAL VASCULAR CATHETERIZATION Right 09/16/2016   Procedure: PORTA CATH REMOVAL;  Surgeon: Wallace Going, DO;  Location: [place];  Service: Plastics;  Laterality: Right;  . PORTACATH PLACEMENT Right  03/02/2016   Procedure: INSERTION PORT-A-CATH WITH ULTRASOUND;  Surgeon: [name], MD;  Location: [place];  Service: General;  Laterality: Right;  IJ  . REMOVAL OF BILATERAL TISSUE EXPANDERS WITH PLACEMENT OF BILATERAL BREAST IMPLANTS Bilateral 09/16/2016   Procedure: REMOVAL OF BILATERAL TISSUE EXPANDERS WITH PLACEMENT OF BILATERAL BREAST IMPLANTS;  Surgeon: Wallace Going, DO;  Location: [place];  Service: Plastics;  Laterality: Bilateral;    There were no vitals filed for this visit.      Subjective Assessment - 07/13/17 1722    Subjective I had my MRI which showed I probably have capsule formation. Options are surgery to release the capsule or release the capsule and replace implants with smaller ones. I have a biopsy Friday. The myofascial release she did on me last time really elped so much! My Rt axilla is still tight, but so much better.    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  On letrozole since late January.  Had 11 lymph nodes removed on the right, one positive.     Patient Stated Goals for the tightening to go away   Currently in Pain? No/denies            [facility] PT Assessment - 07/13/17 0001      AROM   Right Shoulder Flexion 139 Degrees   Right Shoulder ABduction 164  Degrees   Left Shoulder Flexion 143 Degrees   Left Shoulder ABduction 168 Degrees                     [facility] Adult PT Treatment/Exercise - 07/13/17 0001      Manual Therapy   Myofascial Release to right anterior chest and axilla extending to upper arm; diagonal stretching to inferior breast away from UE with pt performing Lt low trunk rotation during.   Passive ROM in supine, P/ROM with stretch to  tolerance for right shoulder er, abduction, and flexion;  no pain with this today                        [facility] Goals - 07/05/17 1658      CC Long Term Goal  #1   Title Pain with reaching with right UE reduced at least 60% for improved ADLs.   Baseline 65% improved as of 05/09/17   Status Achieved     CC Long Term Goal  #2   Title Right shoulder active flexion to at least 150 degrees for improved overhead reach.   Baseline 140 degrees on 07/05/2017   Time 4   Period Weeks   Status On-going     CC Long Term Goal  #3   Title Right shoulder active abduction to at least 160 degrees for improved ADLs   Baseline 150 as of 05/09/17, 166 degrees on 07/05/2017   Status Achieved     CC Long Term Goal  #4   Title Right shoulder external rotation to at least 85 degrees for improved ADLs.   Baseline 80 degrees passively as of 05/16/17.   Time 4   Period Weeks   Status On-going     CC Long Term Goal  #5   Title Independent with HEP for ROM and strengthening.   Time 4   Period Weeks   Status On-going            Plan - 07/13/17 1736    Clinical Impression Statement Just focused on manual therapy today as pt reported feeling great benefit of this at last session. (Also epic was down so was unable see plan for further treatment). Pt tolerated the stretching very well and reported feeling looser by end of session. She has a biopsy Friday but not sure exactly what they are going to do.    Rehab Potential Good   Clinical Impairments Affecting Rehab Potential 11 nodes removed    PT Frequency 2x / week   PT Duration 4 weeks   PT Treatment/Interventions ADLs/Self Care Home Management;Electrical Stimulation;DME Instruction;Therapeutic exercise;Patient/family education;Manual techniques;Manual lymph drainage;Scar mobilization;Passive range of motion;Taping   PT Next Visit Plan See how biopsy went. Progress HEP for bilateral shoulder ROM prn.  Continue manual therapies,  especially myofascial release as pt reported great benfit of this.  Teach  strength ABC program. Facilaitate pt getting a compression sleeve and gauntlet    Consulted and Agree with Plan of Care Patient      Patient will benefit from skilled therapeutic intervention in order to improve the following deficits and impairments:  Decreased range of motion, Increased fascial restricitons, Impaired UE functional use, Increased muscle spasms, Decreased knowledge of precautions, Decreased skin integrity, Pain, Decreased knowledge of use of DME, Decreased strength, Increased edema  Visit Diagnosis: Stiffness of right shoulder, not elsewhere classified  Acute pain of right shoulder  Localized swelling, mass and lump, trunk  Stiffness  of joint, shoulder region, left  Postoperative edema  Stiffness of joint, shoulder region, right  At risk for lymphedema     Problem List Patient Active Problem List   Diagnosis Date Noted  . Hypersensitivity reaction 03/16/2016  . Other iron deficiency anemia 02/16/2016  . Genetic testing 12/22/2015  . Family history of cancer 12/22/2015  . Family history of breast cancer in mother 12/11/2015  . Breast cancer of upper-outer quadrant of right female breast ([place]) 12/02/2015    Otelia Limes, PTA 07/13/2017, 5:39 PM  [place], [place], [address] Phone: [phone]   Fax:  [phone]  Name: Lalana Wachter MRN: [id number] Date of Birth: 07-06-1971

## 2017-07-18 ENCOUNTER — Ambulatory Visit: Payer: BLUE CROSS/BLUE SHIELD | Admitting: Physical Therapy

## 2017-07-19 ENCOUNTER — Other Ambulatory Visit: Payer: BLUE CROSS/BLUE SHIELD

## 2017-07-20 ENCOUNTER — Ambulatory Visit: Payer: BLUE CROSS/BLUE SHIELD | Admitting: Physical Therapy

## 2017-07-20 DIAGNOSIS — M25611 Stiffness of right shoulder, not elsewhere classified: Secondary | ICD-10-CM

## 2017-07-20 DIAGNOSIS — M25612 Stiffness of left shoulder, not elsewhere classified: Secondary | ICD-10-CM

## 2017-07-20 DIAGNOSIS — R222 Localized swelling, mass and lump, trunk: Secondary | ICD-10-CM

## 2017-07-20 DIAGNOSIS — M25511 Pain in right shoulder: Secondary | ICD-10-CM

## 2017-07-20 NOTE — Therapy (Addendum)
[place], [place], [address] Phone: [phone]   Fax:  [phone]  Physical Therapy Treatment  Patient Details  Name: Kiara Spencer MRN: [id number] Date of Birth: [birth year] Referring Provider: Dr. Marla Roe  Encounter Date: 07/20/2017      PT End of Session - 07/20/17 1659    Visit Number 8   Number of Visits 17   Date for PT Re-Evaluation 08/05/17   PT Start Time 0932   PT Stop Time 1651   PT Time Calculation (min) 46 min   Activity Tolerance Patient tolerated treatment well   Behavior During Therapy [facility] for tasks assessed/performed      Past Medical History:  Diagnosis Date  . Breast cancer ([place])   . Breast cancer of upper-outer quadrant of right female breast ([place]) 12/02/2015  . Headache    hx migraines    Past Surgical History:  Procedure Laterality Date  . BREAST RECONSTRUCTION WITH PLACEMENT OF TISSUE EXPANDER AND FLEX HD (ACELLULAR HYDRATED DERMIS) Bilateral 02/05/2016   Procedure: BILATERAL BREAST RECONSTRUCTION WITH PLACEMENT OF TISSUE EXPANDER AND FLEX HD (ACELLULAR HYDRATED DERMIS);  Surgeon: Loel Lofty Dillingham, DO;  Location: [facility];  Service: Plastics;  Laterality: Bilateral;  . CESAREAN SECTION  x 2  . DILATION AND CURETTAGE OF UTERUS    . MASTECTOMY W/ SENTINEL NODE BIOPSY Right 02/05/2016   Procedure: RIGHT NIPPLE SPARING MASTECTOMY WITH RIGHT AXILLARY SENTINEL LYMPH NODE BIOPSY;  Surgeon: [name], MD;  Location: [facility];  Service: General;  Laterality: Right;  . MASTECTOMY, PARTIAL Left 02/05/2016   Procedure: LEFT PROPHYLACTIC NIPPLE SPARING MASTECTOMY ;  Surgeon: [name], MD;  Location: [facility];  Service: General;  Laterality: Left;  . PERIPHERAL VASCULAR CATHETERIZATION Right 09/16/2016   Procedure: PORTA CATH REMOVAL;  Surgeon: Wallace Going, DO;  Location: [facility];  Service: Plastics;  Laterality: Right;  . PORTACATH PLACEMENT Right  03/02/2016   Procedure: INSERTION PORT-A-CATH WITH ULTRASOUND;  Surgeon: [name], MD;  Location: [facility];  Service: General;  Laterality: Right;  IJ  . REMOVAL OF BILATERAL TISSUE EXPANDERS WITH PLACEMENT OF BILATERAL BREAST IMPLANTS Bilateral 09/16/2016   Procedure: REMOVAL OF BILATERAL TISSUE EXPANDERS WITH PLACEMENT OF BILATERAL BREAST IMPLANTS;  Surgeon: Wallace Going, DO;  Location: [facility];  Service: Plastics;  Laterality: Bilateral;    There were no vitals filed for this visit.      Subjective Assessment - 07/20/17 1607    Subjective I ended up not having to have the biopsy.  They think some of the tightening may have been from the radiation. Planning to have her husband come to learn some myofascial release.   Currently in Pain? No/denies  no pain, just tightness                         [facility] Adult PT Treatment/Exercise - 07/20/17 0001      Manual Therapy   Myofascial Release crosshands technique in horizontal across chest, in vertical at left and right sides of chest; UE myofascial pulling each side, and with concurrent distraction at abdomen in opposite directions; focus on crosshands technique across right anterior axilla area; O-A release and then cervical manual traction with concurrent distraction at sternum   Passive ROM pect minor stretches in supine                        Long  Term Clinic Goals - 07/05/17 1658      CC Long Term Goal  #1   Title Pain with reaching with right UE reduced at least 60% for improved ADLs.   Baseline 65% improved as of 05/09/17   Status Achieved     CC Long Term Goal  #2   Title Right shoulder active flexion to at least 150 degrees for improved overhead reach.   Baseline 140 degrees on 07/05/2017   Time 4   Period Weeks   Status On-going     CC Long Term Goal  #3   Title Right shoulder active abduction to at least 160 degrees for improved ADLs   Baseline  150 as of 05/09/17, 166 degrees on 07/05/2017   Status Achieved     CC Long Term Goal  #4   Title Right shoulder external rotation to at least 85 degrees for improved ADLs.   Baseline 80 degrees passively as of 05/16/17.   Time 4   Period Weeks   Status On-going     CC Long Term Goal  #5   Title Independent with HEP for ROM and strengthening.   Time 4   Period Weeks   Status On-going            Plan - 07/20/17 1659    Clinical Impression Statement Pt. reported benefit from last two sessions with focus on manual therapy including myofascial release; she also reported feeling looser after today's session with the same focus.   Rehab Potential Good   Clinical Impairments Affecting Rehab Potential 11 nodes removed    PT Frequency 2x / week   PT Duration 4 weeks   PT Treatment/Interventions ADLs/Self Care Home Management;Electrical Stimulation;DME Instruction;Therapeutic exercise;Patient/family education;Manual techniques;Manual lymph drainage;Scar mobilization;Passive range of motion;Taping   PT Next Visit Plan Check goals. Continue manual therapies, especially myofascial release as pt reported great benfit of this.  Teach strength ABC program. Facilitate pt getting a compression sleeve and gauntlet    Consulted and Agree with Plan of Care Patient      Patient will benefit from skilled therapeutic intervention in order to improve the following deficits and impairments:  Decreased range of motion, Increased fascial restricitons, Impaired UE functional use, Increased muscle spasms, Decreased knowledge of precautions, Decreased skin integrity, Pain, Decreased knowledge of use of DME, Decreased strength, Increased edema  Visit Diagnosis: Stiffness of right shoulder, not elsewhere classified  Acute pain of right shoulder  Localized swelling, mass and lump, trunk  Stiffness of joint, shoulder region, left     Problem List Patient Active Problem List   Diagnosis Date Noted  .  Hypersensitivity reaction 03/16/2016  . Other iron deficiency anemia 02/16/2016  . Genetic testing 12/22/2015  . Family history of cancer 12/22/2015  . Family history of breast cancer in mother 12/11/2015  . Breast cancer of upper-outer quadrant of right female breast ([place]) 12/02/2015    SALISBURY,DONNA 07/20/2017, 5:08 PM  [place], [place], [address] Phone: [phone]   Fax:  [phone]  Name: Sarita Hakanson MRN: [id number] Date of Birth: 1971/06/02  Serafina Royals, PT 07/20/17 5:09 PM  PHYSICAL THERAPY DISCHARGE SUMMARY  Visits from Start of Care: 8  Current functional level related to goals / functional outcomes: Goals partially met as noted above.   Remaining deficits: Unknown, as patient did not complete her course of therapy. She had spotty attendance for scheduled appointments.   Education / Equipment: Home exercise program.  Plan: Patient agrees to discharge.  Patient goals were partially met. Patient is being discharged due to not returning since the last visit.  ?????    Serafina Royals, PT 03/02/18 4:25 PM

## 2017-07-25 ENCOUNTER — Ambulatory Visit: Payer: BLUE CROSS/BLUE SHIELD | Admitting: Physical Therapy

## 2017-08-04 ENCOUNTER — Ambulatory Visit: Payer: BLUE CROSS/BLUE SHIELD | Admitting: Physical Therapy

## 2017-08-10 ENCOUNTER — Ambulatory Visit: Payer: BLUE CROSS/BLUE SHIELD | Admitting: Physical Therapy

## 2017-08-16 ENCOUNTER — Ambulatory Visit: Payer: BLUE CROSS/BLUE SHIELD | Attending: Plastic Surgery | Admitting: Physical Therapy

## 2017-08-18 ENCOUNTER — Encounter: Payer: BLUE CROSS/BLUE SHIELD | Admitting: Physical Therapy

## 2017-10-10 ENCOUNTER — Ambulatory Visit: Payer: BLUE CROSS/BLUE SHIELD | Admitting: Hematology and Oncology

## 2017-10-10 NOTE — Assessment & Plan Note (Deleted)
Rt. 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 T2N1 (Stage 2B); Left mastectomy: Fibrocystic ch. Mammaprint high risk  Treatment summary: 1. No indication for repeat resection since there is nothing further to move in the posterior side 2. Mammaprint: High risk, adjuvant chemotherapy with dose dense Adriamycin Cytoxan 4 followed by weekly Abraxane 12; 03/15/2016- 07/26/2016 3. Followed by radiation therapy (10/11/2016- 12/09/2016) 4. Followed by antiestrogen therapy: Letrozole 2.5 mgdaily  started 01/10/2017 --------------------------------------------------------------------------------------------------------------------------------------------- Letrozole toxicities:  1. Intermittent hot flashes  Implant related pain: Breast MRI done in July 2018 did not show any concerns for recurrent disease.  There was some skin inflammatory changes noted.  Return to clinic in 1 year for follow-up

## 2017-10-10 NOTE — Progress Notes (Deleted)
Patient Care Team: Brien Few, MD as PCP - General (Obstetrics and Gynecology) [name], MD as Consulting Physician (General Surgery) Nicholas Lose, MD as Consulting Physician (Hematology and Oncology) Kyung Rudd, MD as Consulting Physician (Radiation Oncology) Delice Bison Charlestine Massed, NP as Nurse Practitioner (Hematology and Oncology)  DIAGNOSIS:  Encounter Diagnosis  Name Primary?  . Malignant neoplasm of upper-outer quadrant of right breast in female, estrogen receptor positive ([place])     SUMMARY OF ONCOLOGIC HISTORY:   Breast cancer of upper-outer quadrant of right female breast ([place])   11/24/2015 Mammogram    2 suspicious groups of calcifications right breast UOQ posteriorly 2.2 x 2.3 x 2.5 cm (by U/S measured 1.2 x 0.8 x 1.2 cm); anteriorly 1.1 x 1.2 x 0.8 cm (not seen by ultrasound)      11/27/2015 Initial Diagnosis    Right breast biopsy UOQ Posterior: IDC with DCIS with, LVI present, ER 100%, PR 20%, Ki-67 30%, HER-2 negative; ANTERIOR: Complex sclerosing lesions with calcifications      02/05/2016 Surgery    Rt. Mastectomy: IDC Grade 1, 3 cm, 1/11 LN positive,with IG DCIS 0.1 cm margin, Diffuse LVI, cancer at axill tail. T2N1 (Stage 2B); Left mastectomy: Fibrocystic ch, Mammaprint high risk        03/15/2016 - 07/26/2016 Chemotherapy    Dose dense Adriamycin and Cytoxan 4 followed by Abraxane weekly 12      10/11/2016 - 12/09/2016 Radiation Therapy    Adj XRT at [facility]      01/10/2017 -  Anti-estrogen oral therapy    Letrozole 2.5 mg daily       CHIEF COMPLIANT: Follow-up on letrozole therapy  INTERVAL HISTORY: Kiara Spencer is a 46 year old with above-mentioned history of right breast cancer treated with mastectomy and reconstruction.  Because she was high risk on the Mammaprint score she received adjuvant chemotherapy followed by radiation.  She has been on oral antiestrogen therapy with letrozole since February.  She is tolerating letrozole  fairly well except for occasional hot flashes.  She was complaining of discomfort in the reconstructed breasts and underwent a breast MRI which showed some inflammatory changes.  This is most likely related to prior radiation therapy.  REVIEW OF SYSTEMS:   Constitutional: Denies fevers, chills or abnormal weight loss Eyes: Denies blurriness of vision Ears, nose, mouth, throat, and face: Denies mucositis or sore throat Respiratory: Denies cough, dyspnea or wheezes Cardiovascular: Denies palpitation, chest discomfort Gastrointestinal:  Denies nausea, heartburn or change in bowel habits Skin: Denies abnormal skin rashes Lymphatics: Denies new lymphadenopathy or easy bruising Neurological:Denies numbness, tingling or new weaknesses Behavioral/Psych: Mood is stable, no new changes  Extremities: No lower extremity edema Breast:  denies any pain or lumps or nodules in either breasts All other systems were reviewed with the patient and are negative.  I have reviewed the past medical history, past surgical history, social history and family history with the patient and they are unchanged from previous note.  ALLERGIES:  is allergic to cephalexin and ciprofloxacin.  MEDICATIONS:  Current Outpatient Medications  Medication Sig Dispense Refill  . aspirin-acetaminophen-caffeine (EXCEDRIN MIGRAINE) 250-250-65 MG tablet Take 2 tablets by mouth every 6 (six) hours as needed for headache.    . Biotin 10000 MCG TABS Take by mouth.    . Calcium Carb-Cholecalciferol (CALCIUM-VITAMIN D) 500-200 MG-UNIT tablet Take 1 tablet by mouth daily.    [name] letrozole (FEMARA) 2.5 MG tablet Take 1 tablet (2.5 mg total) by mouth daily. 90 tablet 3   No  current facility-administered medications for this visit.     PHYSICAL EXAMINATION: ECOG PERFORMANCE STATUS: 1 - Symptomatic but completely ambulatory  There were no vitals filed for this visit. There were no vitals filed for this visit.  GENERAL:alert, no distress  and comfortable SKIN: skin color, texture, turgor are normal, no rashes or significant lesions EYES: normal, Conjunctiva are pink and non-injected, sclera clear OROPHARYNX:no exudate, no erythema and lips, buccal mucosa, and tongue normal  NECK: supple, thyroid normal size, non-tender, without nodularity LYMPH:  no palpable lymphadenopathy in the cervical, axillary or inguinal LUNGS: clear to auscultation and percussion with normal breathing effort HEART: regular rate & rhythm and no murmurs and no lower extremity edema ABDOMEN:abdomen soft, non-tender and normal bowel sounds MUSCULOSKELETAL:no cyanosis of digits and no clubbing  NEURO: alert & oriented x 3 with fluent speech, no focal motor/sensory deficits EXTREMITIES: No lower extremity edema BREAST: Right breast reconstructed  LABORATORY DATA:  I have reviewed the data as listed   Chemistry      Component Value Date/Time   NA 144 01/10/2017 0812   K 4.1 01/10/2017 0812   CL 102 03/02/2016 1045   CO2 28 01/10/2017 0812   BUN 16.0 01/10/2017 0812   CREATININE 0.9 01/10/2017 0812      Component Value Date/Time   CALCIUM 10.3 01/10/2017 0812   ALKPHOS 61 01/10/2017 0812   AST 22 01/10/2017 0812   ALT 21 01/10/2017 0812   BILITOT 0.40 01/10/2017 0812       Lab Results  Component Value Date   WBC 3.2 (L) 01/10/2017   HGB 12.9 01/10/2017   HCT 38.9 01/10/2017   MCV 88.8 01/10/2017   PLT 213 01/10/2017   NEUTROABS 2.1 01/10/2017    ASSESSMENT & PLAN:  Breast cancer of upper-outer quadrant of right female breast ([place]) Rt. 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 T2N1 (Stage 2B); Left mastectomy: Fibrocystic ch. Mammaprint high risk  Treatment summary: 1. No indication for repeat resection since there is nothing further to move in the posterior side 2. Mammaprint: High risk, adjuvant chemotherapy with dose dense Adriamycin Cytoxan 4 followed  by weekly Abraxane 12; 03/15/2016- 07/26/2016 3. Followed by radiation therapy (10/11/2016- 12/09/2016) 4. Followed by antiestrogen therapy: Letrozole 2.5 mgdaily  started 01/10/2017 --------------------------------------------------------------------------------------------------------------------------------------------- Letrozole toxicities:  1. Intermittent hot flashes  Implant related pain: Breast MRI done in July 2018 did not show any concerns for recurrent disease.  There was some skin inflammatory changes noted.  Return to clinic in 1 year for follow-up   I spent 25 minutes talking to the patient of which more than half was spent in counseling and coordination of care.  No orders of the defined types were placed in this encounter.  The patient has a good understanding of the overall plan. she agrees with it. she will call with any problems that may develop before the next visit here.   Rulon Eisenmenger, MD 10/10/17

## 2018-01-26 ENCOUNTER — Other Ambulatory Visit: Payer: Self-pay | Admitting: Hematology and Oncology

## 2018-09-24 ENCOUNTER — Emergency Department: Payer: BLUE CROSS/BLUE SHIELD

## 2018-09-24 ENCOUNTER — Emergency Department
Admission: EM | Admit: 2018-09-24 | Discharge: 2018-09-24 | Disposition: A | Discharge status: Home / Self Care | Payer: BLUE CROSS/BLUE SHIELD | Attending: Emergency Medicine | Admitting: Emergency Medicine

## 2018-09-24 ENCOUNTER — Other Ambulatory Visit: Payer: Self-pay

## 2018-09-24 DIAGNOSIS — Z853 Personal history of malignant neoplasm of breast: Secondary | ICD-10-CM | POA: Diagnosis not present

## 2018-09-24 DIAGNOSIS — Z79899 Other long term (current) drug therapy: Secondary | ICD-10-CM | POA: Diagnosis not present

## 2018-09-24 DIAGNOSIS — Z3202 Encounter for pregnancy test, result negative: Secondary | ICD-10-CM | POA: Insufficient documentation

## 2018-09-24 DIAGNOSIS — R1031 Right lower quadrant pain: Secondary | ICD-10-CM | POA: Diagnosis present

## 2018-09-24 DIAGNOSIS — D219 Benign neoplasm of connective and other soft tissue, unspecified: Secondary | ICD-10-CM | POA: Diagnosis not present

## 2018-09-24 LAB — COMPREHENSIVE METABOLIC PANEL
ALT: 15 U/L (ref 0–44)
ANION GAP: 6 (ref 5–15)
AST: 20 U/L (ref 15–41)
Albumin: 4.3 g/dL (ref 3.5–5.0)
Alkaline Phosphatase: 56 U/L (ref 38–126)
BILIRUBIN TOTAL: 0.5 mg/dL (ref 0.3–1.2)
BUN: 18 mg/dL (ref 6–20)
CHLORIDE: 105 mmol/L (ref 98–111)
CO2: 29 mmol/L (ref 22–32)
Calcium: 9.7 mg/dL (ref 8.9–10.3)
Creatinine, Ser: 0.68 mg/dL (ref 0.44–1.00)
GFR calc Af Amer: >60 mL/min (ref >60)
Glucose, Bld: 74 mg/dL (ref 70–99)
POTASSIUM: 4.5 mmol/L (ref 3.5–5.1)
Sodium: 140 mmol/L (ref 135–145)
TOTAL PROTEIN: 7.3 g/dL (ref 6.5–8.1)

## 2018-09-24 LAB — URINALYSIS, COMPLETE (UACMP) WITH MICROSCOPIC
BILIRUBIN URINE: NEGATIVE
Bacteria, UA: NONE SEEN
Glucose, UA: NEGATIVE mg/dL
HGB URINE DIPSTICK: NEGATIVE
KETONES UR: NEGATIVE mg/dL
LEUKOCYTES UA: NEGATIVE
NITRITE: NEGATIVE
Protein, ur: NEGATIVE mg/dL
RBC / HPF: NONE SEEN RBC/hpf (ref 0–5)
SPECIFIC GRAVITY, URINE: 1.01 (ref 1.005–1.030)
SQUAMOUS EPITHELIAL / LPF: NONE SEEN (ref 0–5)
WBC, UA: NONE SEEN WBC/hpf (ref 0–5)
pH: 6.5 (ref 5.0–8.0)

## 2018-09-24 LAB — CBC
HEMATOCRIT: 41 % (ref 36.0–46.0)
HEMOGLOBIN: 13 g/dL (ref 12.0–15.0)
MCH: 30 pg (ref 26.0–34.0)
MCHC: 31.7 g/dL (ref 30.0–36.0)
MCV: 94.7 fL (ref 80.0–100.0)
NRBC: 0 % (ref 0.0–0.2)
Platelets: 251 10*3/uL (ref 150–400)
RBC: 4.33 MIL/uL (ref 3.87–5.11)
RDW: 12.1 % (ref 11.5–15.5)
WBC: 4.4 10*3/uL (ref 4.0–10.5)

## 2018-09-24 LAB — CHLAMYDIA/NGC RT PCR (ARMC ONLY)
CHLAMYDIA TR: NOT DETECTED
N gonorrhoeae: NOT DETECTED

## 2018-09-24 LAB — LIPASE, BLOOD: LIPASE: 47 U/L (ref 11–51)

## 2018-09-24 LAB — POCT PREGNANCY, URINE: PREG TEST UR: NEGATIVE

## 2018-09-24 LAB — WET PREP, GENITAL
Clue Cells Wet Prep HPF POC: NONE SEEN
SPERM: NONE SEEN
TRICH WET PREP: NONE SEEN
Yeast Wet Prep HPF POC: NONE SEEN

## 2018-09-24 MED ORDER — IOPAMIDOL (ISOVUE-300) INJECTION 61%
100.0000 mL | Freq: Once | INTRAVENOUS | Status: AC | PRN
  Administered 2018-09-24: 100 mL via INTRAVENOUS

## 2018-09-24 MED ORDER — IOPAMIDOL (ISOVUE-300) INJECTION 61%
30.0000 mL | Freq: Once | INTRAVENOUS | Status: AC | PRN
  Administered 2018-09-24: 30 mL via ORAL

## 2018-09-24 NOTE — ED Triage Notes (Signed)
First nurse note: pt states that she has been having on going abd pain since Friday, states that the pain is persistent and intermittently sharp, now starting to experience some nausea, pt reports hx of breast cancer 2 years ago

## 2018-09-24 NOTE — ED Triage Notes (Signed)
Pt c/o RLQ abd pain since Friday around 1p. Nausea but no vomiting, diarrhea, or fever. C/o constipation. Still has abd organs.   A&O x4, ambulatory. No distress noted.   R sided breast CA surgery 2 years ago.

## 2018-09-24 NOTE — ED Provider Notes (Addendum)
[facility] Emergency Department Provider Note ____________________________________________   I have reviewed the triage vital signs and the triage nursing note.  HISTORY  Chief Complaint Abdominal Pain   Historian Patient  HPI Kiara Spencer is a 47 y.o. female presents with right lower quadrant pain that started on Friday, 2 days ago.  It has been constant and worsening but somewhat waxing and waning.  At times it severe.  Currently it is about 3 out of 10.  Mild nausea without vomiting today.  She does have a history of constipation and hard stools.  Denies urinary symptoms.  Denies flank pain.   History of breast cancer and radiation for that.  Still has bladder, appendix and ovaries.  Vaginal discharge or pelvic pain.       Past Medical History:  Diagnosis Date  . Breast cancer ([place])   . Breast cancer of upper-outer quadrant of right female breast ([place]) 12/02/2015  . Headache    hx migraines    Patient Active Problem List   Diagnosis Date Noted  . Hypersensitivity reaction 03/16/2016  . Other iron deficiency anemia 02/16/2016  . Genetic testing 12/22/2015  . Family history of cancer 12/22/2015  . Family history of breast cancer in mother 12/11/2015  . Breast cancer of upper-outer quadrant of right female breast ([place]) 12/02/2015    Past Surgical History:  Procedure Laterality Date  . BREAST RECONSTRUCTION WITH PLACEMENT OF TISSUE EXPANDER AND FLEX HD (ACELLULAR HYDRATED DERMIS) Bilateral 02/05/2016   Procedure: BILATERAL BREAST RECONSTRUCTION WITH PLACEMENT OF TISSUE EXPANDER AND FLEX HD (ACELLULAR HYDRATED DERMIS);  Surgeon: Loel Lofty Dillingham, DO;  Location: [place];  Service: Plastics;  Laterality: Bilateral;  . CESAREAN SECTION  x 2  . DILATION AND CURETTAGE OF UTERUS    . MASTECTOMY W/ SENTINEL NODE BIOPSY Right 02/05/2016   Procedure: RIGHT NIPPLE SPARING MASTECTOMY WITH RIGHT AXILLARY SENTINEL LYMPH NODE BIOPSY;  Surgeon: [name], MD;  Location: [place];  Service: General;  Laterality: Right;  . MASTECTOMY, PARTIAL Left 02/05/2016   Procedure: LEFT PROPHYLACTIC NIPPLE SPARING MASTECTOMY ;  Surgeon: [name], MD;  Location: [place];  Service: General;  Laterality: Left;  . PERIPHERAL VASCULAR CATHETERIZATION Right 09/16/2016   Procedure: PORTA CATH REMOVAL;  Surgeon: Wallace Going, DO;  Location: [place];  Service: Plastics;  Laterality: Right;  . PORTACATH PLACEMENT Right 03/02/2016   Procedure: INSERTION PORT-A-CATH WITH ULTRASOUND;  Surgeon: [name], MD;  Location: [place];  Service: General;  Laterality: Right;  IJ  . REMOVAL OF BILATERAL TISSUE EXPANDERS WITH PLACEMENT OF BILATERAL BREAST IMPLANTS Bilateral 09/16/2016   Procedure: REMOVAL OF BILATERAL TISSUE EXPANDERS WITH PLACEMENT OF BILATERAL BREAST IMPLANTS;  Surgeon: Wallace Going, DO;  Location: [place];  Service: Plastics;  Laterality: Bilateral;    Prior to Admission medications   Medication Sig Start Date End Date Taking? Authorizing Provider  aspirin-acetaminophen-caffeine (EXCEDRIN MIGRAINE) [phone] MG tablet Take 2 tablets by mouth every 6 (six) hours as needed for headache.   Yes [provider]  Biotin 10000 MCG TABS Take 10 mg by mouth daily.    Yes [provider]  Calcium Carb-Cholecalciferol (CALCIUM-VITAMIN D) 500-200 MG-UNIT tablet Take 1 tablet by mouth daily.   Yes [provider]  letrozole (FEMARA) 2.5 MG tablet TAKE 1 TABLET (2.5 MG TOTAL) BY MOUTH DAILY. 01/26/18  Yes Nicholas Lose, MD    Allergies  Allergen Reactions  . Cephalexin Rash  . Ciprofloxacin  Rash  . Propofol Rash    Another anesthesia medication as well - name unknown    Family History  Problem Relation Age of Onset  . Breast cancer Mother 13       s/p partial mastectomy  . Colon polyps Mother        3 polyps total  . Breast cancer Maternal Aunt 64  . Leukemia  Maternal Uncle 27  . Heart attack Paternal Aunt   . Heart attack Maternal Grandmother   . Heart attack Maternal Grandfather   . Lupus Maternal Aunt   . Prostate cancer Maternal Uncle 68  . Prostate cancer Maternal Uncle 72  . Prostate cancer Maternal Uncle   . Heart attack Maternal Uncle   . Prostate cancer Maternal Uncle        dx. 4s   . Breast cancer Cousin        dx. early 31s or younger; may have had genetic testing  . Cancer Cousin        "rare" cancer, dx. 73s  . Pancreatic cancer Cousin        dx. 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 Throat cancer Paternal Uncle        dx. early 40s  . Cancer Other        unspecified type    Social History Social History   Tobacco Use  . Smoking status: Never Smoker  . Smokeless tobacco: Never Used  Substance Use Topics  . Alcohol use: No  . Drug use: No    Review of Systems  Constitutional: Negative for fever. Eyes: Negative for visual changes. ENT: Negative for sore throat. Cardiovascular: Negative for chest pain. Respiratory: Negative for shortness of breath. Gastrointestinal: Abdominal pain as per HPI. Genitourinary: Negative for dysuria. Musculoskeletal: Negative for back pain. Skin: Negative for rash. Neurological: Negative for headache.  ____________________________________________   PHYSICAL EXAM:  VITAL SIGNS: ED Triage Vitals  Enc Vitals Group     BP 09/24/18 1006 127/69     Pulse Rate 09/24/18 1006 78     Resp 09/24/18 1006 16     Temp 09/24/18 1006 98.6 F (37 C)     Temp Source 09/24/18 1006 Oral     SpO2 09/24/18 1006 99 %     Weight 09/24/18 1007 143 lb (64.9 kg)     Height 09/24/18 1007 5\' 1"  (1.549 m)     Head Circumference --      Peak Flow --      Pain Score 09/24/18 1007 2     Pain Loc --      Pain Edu? --      Excl. in [place]? --      Constitutional: Alert and oriented.  HEENT      Head: Normocephalic and atraumatic.      Eyes: Conjunctivae  are normal. Pupils equal and round.       Ears:         Nose: No congestion/rhinnorhea.      Mouth/Throat: Mucous membranes are moist.      Neck: No stridor. Cardiovascular/Chest: Normal rate, regular rhythm.  No murmurs, rubs, or gallops. Respiratory: Normal respiratory effort without tachypnea nor retractions. Breath sounds are clear and equal bilaterally. No wheezes/rales/rhonchi. Gastrointestinal: Soft. No distention, no guarding, no rebound.  Moderate tenderness right side abdomen right lower quadrant without focal right upper quadrant tenderness or left-sided abdominal pain. Genitourinary/rectal:  Scant discharge.  No blood.  No cmt.  No adnexal tenderness Musculoskeletal: Nontender with normal range of motion in all extremities. No joint effusions.  No lower extremity tenderness.  No edema. Neurologic:  Normal speech and language. No gross or focal neurologic deficits are appreciated. Skin:  Skin is warm, dry and intact. No rash noted. Psychiatric: Mood and affect are normal. Speech and behavior are normal. Patient exhibits appropriate insight and judgment.   ____________________________________________  LABS (pertinent positives/negatives) I, Lisa Roca, MD the attending physician have reviewed the labs noted below.  Labs Reviewed  WET PREP, GENITAL - Abnormal; Notable for the following components:      Result Value   WBC, Wet Prep HPF POC RARE (*)    All other components within normal limits  CHLAMYDIA/NGC RT PCR ([facility] ONLY)  LIPASE, BLOOD  COMPREHENSIVE METABOLIC PANEL  CBC  URINALYSIS, COMPLETE (UACMP) WITH MICROSCOPIC  POC URINE PREG, ED  POCT PREGNANCY, URINE    ____________________________________________    EKG I, Lisa Roca, MD, the attending physician have personally viewed and interpreted all ECGs.  None ____________________________________________  RADIOLOGY   Pelvic ultrasound with Doppler:  IMPRESSION: 1. No acute findings and no  explanation for patient's right lower quadrant pain. 2. No ovarian mass or evidence of torsion. 3. Small volume of free fluid within the pelvis. Nonspecific.  CT abd pel with contrast:  IMPRESSION: No evidence of abnormalities within the solid abdominal organs.  Calcified subserosal fundal uterine fibroid within the right pelvis. __________________________________________  PROCEDURES  Procedure(s) performed: None  Procedures  Critical Care performed: None   ____________________________________________  ED COURSE / ASSESSMENT AND PLAN  Pertinent labs & imaging results that were available during my care of the patient were reviewed by me and considered in my medical decision making (see chart for details).    For right lower quadrant pain for almost 2 days now.  We discussed differential to include but not limited to constipation, appendicitis, kidney stone, ovarian cyst or ovarian torsion.  Clinically no cervicitis on exam.  Chose to proceed with ultrasound pelvic first and this showed no cause for the pain.  Her white blood cell count is in the normal range, however given no other cause for pain and persistent right lower quadrant pain, discussed risk and benefit of CT scan and chose to proceed.  Ct without cause for pain.  Uncertain cause, but reassuring eval today.   CONSULTATIONS: None   Patient / Family / Caregiver informed of clinical course, medical decision-making process, and agree with plan.   I discussed return precautions, follow-up instructions, and discharge instructions with patient and/or family.  Discharge Instructions :   You are evaluated for abdominal pain, and although no certain cause found, your exam and evaluation overall reassuring in the emerge department today.  Please try over-the-counter ibuprofen 600 mg every 8 hours as needed for pain.  You should drink plenty fluids. For constipation you may try over the counter stool softener  docusate, use as directed on labelling or over the counter or legs, use as directed on labeling.  Return to the emergency department immediately for any worsening condition including new or worsening abdominal pain, black or bloody stool, vomiting blood, fever, or any other symptoms concerning to you.   ___________________________________________   FINAL CLINICAL IMPRESSION(S) / ED DIAGNOSES   Final diagnoses:  RLQ abdominal pain  Fibroid      ___________________________________________         Note: This dictation was prepared with Dragon dictation. Any transcriptional errors that  result from this process are unintentional    Lisa Roca, MD 09/24/18 1540    Lisa Roca, MD 09/24/18 [phone]

## 2018-09-24 NOTE — Discharge Instructions (Signed)
You are evaluated for abdominal pain, and although no certain cause found, your exam and evaluation overall reassuring in the emerge department today.  Please try over-the-counter ibuprofen 600 mg every 8 hours as needed for pain.  You should drink plenty fluids. For constipation you may try over the counter stool softener docusate, use as directed on labelling or over the counter or legs, use as directed on labeling.  Return to the emergency department immediately for any worsening condition including new or worsening abdominal pain, black or bloody stool, vomiting blood, fever, or any other symptoms concerning to you.

## 2018-09-24 NOTE — ED Notes (Signed)
Pt to US via stretcher

## 2018-09-24 NOTE — ED Notes (Signed)
Pt to CT via stretcher

## 2018-09-24 NOTE — ED Notes (Signed)
ED Provider at bedside. 

## 2019-01-23 ENCOUNTER — Telehealth: Payer: Self-pay | Admitting: *Deleted

## 2019-01-23 ENCOUNTER — Telehealth: Payer: Self-pay

## 2019-01-23 NOTE — Telephone Encounter (Signed)
Called and made appt for pt this week with Dr.Gudena. Thank you.

## 2019-01-23 NOTE — Telephone Encounter (Signed)
error 

## 2019-01-23 NOTE — Telephone Encounter (Signed)
Pt called to report that she had been experiencing intermittent R arm pain since Thanksgiving last year. Hx of bilateral mastectomy. Pt states that her R arm had been hurting and at times feels heavy and numb. Denies any recent physical activity or overuse of extremity. Denies swelling, tenderness, or inflammation. Pt states that her Right arm has always been bigger than her left and did not notice any swelling. Pt currently maintaining on letrozole treatment.  Pt last seen in 04/2017. Pt would like to see Dr.Gudena as soon as possible to be evaluated for her arm pain. Confirmed time/date of appt.

## 2019-01-23 NOTE — Telephone Encounter (Signed)
"  [employer] [phone]).     Experiencing symptoms to right arm where lymph nodes removed.  Takes extra effort to make right arm move certain ways.     Upper outer part of my shoulder, above the elbow feels numb; similar to pain when arm falls asleep.    Try to stretch and pop it doesn't help.    Pain level six on pain scale.    Started Thanksgiving, ended for Christmas and returned this week.    Consistent every evening; worse at night.  Wakes me out of my sleep.   Right arm always feels cold to touch.  No injury or falls."

## 2019-01-24 NOTE — Progress Notes (Signed)
Patient Care Team: Brien Few, MD as PCP - General (Obstetrics and Gynecology) [name], MD as Consulting Physician (General Surgery) Nicholas Lose, MD as Consulting Physician (Hematology and Oncology) Kyung Rudd, MD as Consulting Physician (Radiation Oncology) Delice Bison Charlestine Massed, NP as Nurse Practitioner (Hematology and Oncology)  DIAGNOSIS:    ICD-10-CM   1. Malignant neoplasm of upper-outer quadrant of right female breast, unspecified estrogen receptor status ([place]) C50.411 DG Arthro Shoulder Right    DG Humerus Right    NM Bone Scan Whole Body  2. Malignant neoplasm of upper-outer quadrant of right breast in female, estrogen receptor positive ([place]) C50.411    Z17.0     SUMMARY OF ONCOLOGIC HISTORY:   Breast cancer of upper-outer quadrant of right female breast ([place])   11/24/2015 Mammogram    2 suspicious groups of calcifications right breast UOQ posteriorly 2.2 x 2.3 x 2.5 cm (by U/S measured 1.2 x 0.8 x 1.2 cm); anteriorly 1.1 x 1.2 x 0.8 cm (not seen by ultrasound)    11/27/2015 Initial Diagnosis    Right breast biopsy UOQ Posterior: IDC with DCIS with, LVI present, ER 100%, PR 20%, Ki-67 30%, HER-2 negative; ANTERIOR: Complex sclerosing lesions with calcifications    02/05/2016 Surgery    Rt. Mastectomy: IDC Grade 1, 3 cm, 1/11 LN positive,with IG DCIS 0.1 cm margin, Diffuse LVI, cancer at axill tail. T2N1 (Stage 2B); Left mastectomy: Fibrocystic ch, Mammaprint high risk      03/15/2016 - 07/26/2016 Chemotherapy    Dose dense Adriamycin and Cytoxan 4 followed by Abraxane weekly 12    10/11/2016 - 12/09/2016 Radiation Therapy    Adj XRT at [facility]    01/10/2017 -  Anti-estrogen oral therapy    Letrozole 2.5 mg daily     CHIEF COMPLIANT: Follow-up on letrozole therapy  INTERVAL HISTORY: Kiara Spencer is a 48 y.o. with above-mentioned history of right breast cancer treated with mastectomy followed by adjuvant chemotherapy and radiation. She is  currently on letrozole. I last saw the patient a year and a half ago. She presents to the clinic today with a friend. She denies any side effects of letrozole, but notes that if she does not take Calcium and Vitamin D daily her bones hurt. She denies any discomfort from her breast implants. She notes constant right arm pain, even when her arm is at rest, that wakes her up at night and began about 2.5 months ago. The pain ranges from dull to sharp, up to an 8/10, and no movements provide improvement. She has not taken any medication for the pain. She notes lack of sensation under her arm. She denies any lifting of heavy objects recently. She reviewed her medication list with me.   REVIEW OF SYSTEMS:   Constitutional: Denies fevers, chills or abnormal weight loss Eyes: Denies blurriness of vision Ears, nose, mouth, throat, and face: Denies mucositis or sore throat Respiratory: Denies cough, dyspnea or wheezes Cardiovascular: Denies palpitation, chest discomfort Gastrointestinal: Denies nausea, heartburn or change in bowel habits Skin: Denies abnormal skin rashes MSK: (+) constant right arm pain Lymphatics: Denies easy bruising or new lymphadenopathy Neurological: Denies numbness, tingling or new weaknesses Behavioral/Psych: Mood is stable, no new changes  Extremities: No lower extremity edema Breast: denies any pain or lumps or nodules in either breasts All other systems were reviewed with the patient and are negative.  I have reviewed the past medical history, past surgical history, social history and family history with the patient and they are unchanged  from previous note.  ALLERGIES:  is allergic to cephalexin; ciprofloxacin; and propofol.  MEDICATIONS:  Current Outpatient Medications  Medication Sig Dispense Refill  . aspirin-acetaminophen-caffeine (EXCEDRIN MIGRAINE) 250-250-65 MG tablet Take 2 tablets by mouth every 6 (six) hours as needed for headache.    . Biotin 10000 MCG TABS Take  10 mg by mouth daily.     . Calcium Carb-Cholecalciferol (CALCIUM-VITAMIN D) 500-200 MG-UNIT tablet Take 1 tablet by mouth daily.    [name] letrozole (FEMARA) 2.5 MG tablet Take 1 tablet (2.5 mg total) by mouth daily. 90 tablet 3   No current facility-administered medications for this visit.     PHYSICAL EXAMINATION: ECOG PERFORMANCE STATUS: 1 - Symptomatic but completely ambulatory  Vitals:   01/25/19 1416  BP: 121/79  Pulse: 68  Resp: 18  Temp: 98.3 F (36.8 C)  SpO2: 100%   Filed Weights   01/25/19 1416  Weight: 141 lb 3.2 oz (64 kg)    GENERAL: alert, no distress and comfortable SKIN: skin color, texture, turgor are normal, no rashes or significant lesions EYES: normal, Conjunctiva are pink and non-injected, sclera clear OROPHARYNX: no exudate, no erythema and lips, buccal mucosa, and tongue normal  NECK: supple, thyroid normal size, non-tender, without nodularity LYMPH: no palpable lymphadenopathy in the cervical, axillary or inguinal LUNGS: clear to auscultation and percussion with normal breathing effort HEART: regular rate & rhythm and no murmurs and no lower extremity edema ABDOMEN: abdomen soft, non-tender and normal bowel sounds MUSCULOSKELETAL: no cyanosis of digits and no clubbing  NEURO: alert & oriented x 3 with fluent speech, no focal motor/sensory deficits EXTREMITIES: Tenderness to hyper abduction and extension of the shoulder. BREAST: No palpable masses or nodules in either right or left breasts. No palpable axillary supraclavicular or infraclavicular adenopathy no breast tenderness or nipple discharge. (exam performed in the presence of a chaperone)   LABORATORY DATA:  I have reviewed the data as listed CMP Latest Ref Rng & Units 09/24/2018 01/10/2017 07/26/2016  Glucose 70 - 99 mg/dL 74 86 94  BUN 6 - 20 mg/dL 18 16.0 9.3  Creatinine 0.44 - 1.00 mg/dL 0.68 0.9 0.7  Sodium 135 - 145 mmol/L 140 144 143  Potassium 3.5 - 5.1 mmol/L 4.5 4.1 4.6  Chloride 98 - 111  mmol/L 105 - -  CO2 22 - 32 mmol/L '29 28 25  ' Calcium 8.9 - 10.3 mg/dL 9.7 10.3 9.4  Total Protein 6.5 - 8.1 g/dL 7.3 7.0 6.4  Total Bilirubin 0.3 - 1.2 mg/dL 0.5 0.40 0.30  Alkaline Phos 38 - 126 U/L 56 61 51  AST 15 - 41 U/L '20 22 22  ' ALT 0 - 44 U/L '15 21 18    ' Lab Results  Component Value Date   WBC 4.4 09/24/2018   HGB 13.0 09/24/2018   HCT 41.0 09/24/2018   MCV 94.7 09/24/2018   PLT 251 09/24/2018   NEUTROABS 2.1 01/10/2017    ASSESSMENT & PLAN:  Breast cancer of upper-outer quadrant of right female breast ([place]) Rt. 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 T2N1 (Stage 2B); Left mastectomy: Fibrocystic ch. Mammaprint high risk  Treatment summary: 1. No indication for repeat resection since there is nothing further to move in the posterior side 2. Mammaprint: High risk, adjuvant chemotherapy with dose dense Adriamycin Cytoxan 4 followed by weekly Abraxane 12; 03/15/2016- 07/26/2016 3. Followed by radiation therapy (10/11/2016- 12/09/2016) 4. Followed by antiestrogen  therapy: Letrozole 2.5 mgdaily  started 01/10/2017 --------------------------------------------------------------------------------------------------------------------------------------------- Letrozole toxicities:  1. Intermittent hot flashes  Pain in the right arm: 1.  Will obtain x-ray of the shoulder and the arm today 2.  Bone scan will be ordered I will call her with results of these tests. Breast cancer surveillance: 1.  Breast MRI 06/29/2017: Asymmetric enhancement around the skin and subcutaneous fat and musculature of the reconstructed right breast/right chest wall, possible edema possible inflammation, implants are intact 2.  CT abdomen pelvis 09/24/2018: Negative for any abnormalities  Return to clinic in 1 year for follow-up    Orders Placed This Encounter  Procedures  . DG Arthro Shoulder Right    Standing  Status:   Future    Standing Expiration Date:   03/25/2020    Order Specific Question:   Reason for Exam (SYMPTOM  OR DIAGNOSIS REQUIRED)    Answer:   Right shoulder pain    Order Specific Question:   Is the patient pregnant?    Answer:   No    Order Specific Question:   Preferred Imaging Location?    Answer:   [facility]    Order Specific Question:   Radiology Contrast Protocol - do NOT remove file path    Answer:   \\[email or web address].[email or web address]  . DG Humerus Right    Standing Status:   Future    Standing Expiration Date:   01/25/2020    Order Specific Question:   Reason for Exam (SYMPTOM  OR DIAGNOSIS REQUIRED)    Answer:   Right arm pain    Order Specific Question:   Is patient pregnant?    Answer:   No    Order Specific Question:   Preferred imaging location?    Answer:   [facility]    Order Specific Question:   Radiology Contrast Protocol - do NOT remove file path    Answer:   \\[email or web address].pdf  . NM Bone Scan Whole Body    Standing Status:   Future    Standing Expiration Date:   01/25/2020    Order Specific Question:   ** REASON FOR EXAM (FREE TEXT)    Answer:   Bone pain    Order Specific Question:   If indicated for the ordered procedure, I authorize the administration of a radiopharmaceutical per Radiology protocol    Answer:   Yes    Order Specific Question:   Is the patient pregnant?    Answer:   No    Order Specific Question:   Preferred imaging location?    Answer:   [facility]    Order Specific Question:   Radiology Contrast Protocol - do NOT remove file path    Answer:   \\[email or web address]   The patient has a good understanding of the overall plan. she agrees with it. she will call with any problems that may develop before the next visit here.  Nicholas Lose, MD 01/25/2019  Julious Oka Dorshimer am acting as scribe for Dr. Nicholas Lose.  I  have reviewed the above documentation for accuracy and completeness, and I agree with the above.

## 2019-01-25 ENCOUNTER — Other Ambulatory Visit: Payer: Self-pay

## 2019-01-25 ENCOUNTER — Inpatient Hospital Stay: Payer: BC Managed Care – PPO | Attending: Hematology and Oncology | Admitting: Hematology and Oncology

## 2019-01-25 ENCOUNTER — Ambulatory Visit (HOSPITAL_COMMUNITY)
Admission: RE | Admit: 2019-01-25 | Discharge: 2019-01-25 | Disposition: A | Discharge status: Home / Self Care | Payer: BC Managed Care – PPO | Source: Ambulatory Visit | Attending: Hematology and Oncology | Admitting: Hematology and Oncology

## 2019-01-25 ENCOUNTER — Telehealth: Payer: Self-pay | Admitting: *Deleted

## 2019-01-25 VITALS — BP 121/79 | HR 68 | Temp 98.3°F | Resp 18 | Ht 61.0 in | Wt 141.2 lb

## 2019-01-25 DIAGNOSIS — Z923 Personal history of irradiation: Secondary | ICD-10-CM | POA: Insufficient documentation

## 2019-01-25 DIAGNOSIS — Z170 Estrogen receptor positive status [ER+]: Secondary | ICD-10-CM

## 2019-01-25 DIAGNOSIS — R928 Other abnormal and inconclusive findings on diagnostic imaging of breast: Secondary | ICD-10-CM

## 2019-01-25 DIAGNOSIS — C50411 Malignant neoplasm of upper-outer quadrant of right female breast: Secondary | ICD-10-CM | POA: Insufficient documentation

## 2019-01-25 DIAGNOSIS — Z79899 Other long term (current) drug therapy: Secondary | ICD-10-CM | POA: Insufficient documentation

## 2019-01-25 DIAGNOSIS — Z9011 Acquired absence of right breast and nipple: Secondary | ICD-10-CM | POA: Insufficient documentation

## 2019-01-25 DIAGNOSIS — Z7982 Long term (current) use of aspirin: Secondary | ICD-10-CM | POA: Insufficient documentation

## 2019-01-25 DIAGNOSIS — Z79811 Long term (current) use of aromatase inhibitors: Secondary | ICD-10-CM | POA: Diagnosis not present

## 2019-01-25 DIAGNOSIS — Z9221 Personal history of antineoplastic chemotherapy: Secondary | ICD-10-CM | POA: Insufficient documentation

## 2019-01-25 DIAGNOSIS — M25511 Pain in right shoulder: Secondary | ICD-10-CM

## 2019-01-25 MED ORDER — LETROZOLE 2.5 MG PO TABS: 2.5000 mg | ORAL_TABLET | Freq: Every day | ORAL | 3 refills | Status: DC

## 2019-01-25 NOTE — Assessment & Plan Note (Addendum)
Rt. 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 T2N1 (Stage 2B); Left mastectomy: Fibrocystic ch. Mammaprint high risk  Treatment summary: 1. No indication for repeat resection since there is nothing further to move in the posterior side 2. Mammaprint: High risk, adjuvant chemotherapy with dose dense Adriamycin Cytoxan 4 followed by weekly Abraxane 12; 03/15/2016- 07/26/2016 3. Followed by radiation therapy (10/11/2016- 12/09/2016) 4. Followed by antiestrogen therapy: Letrozole 2.5 mgdaily  started 01/10/2017 --------------------------------------------------------------------------------------------------------------------------------------------- Letrozole toxicities:  1. Intermittent hot flashes  Pain in the right arm: 1.  Will obtain x-ray of the shoulder and the arm today 2.  Bone scan will be ordered I will call her with results of these tests. Breast cancer surveillance: 1.  Breast MRI 06/29/2017: Asymmetric enhancement around the skin and subcutaneous fat and musculature of the reconstructed right breast/right chest wall, possible edema possible inflammation, implants are intact 2.  CT abdomen pelvis 09/24/2018: Negative for any abnormalities  Return to clinic in 1 year for follow-up

## 2019-01-25 NOTE — Telephone Encounter (Signed)
"  Kiara Spencer with radiology calling.  Eveleigh Crumpler is on her way over.  DG Arthro shoulder Right ordered.  Order should be DG Shoulder Right.  Arthrogram is used only when pt receives contrast medium injection."   Radiology exam scheduled at this time.  Order entered at this time.

## 2019-01-25 NOTE — Telephone Encounter (Signed)
Changed to correct order. MD aware.

## 2019-01-25 NOTE — Progress Notes (Signed)
R shoulder order per Dr.Gudena

## 2019-02-16 ENCOUNTER — Ambulatory Visit (HOSPITAL_COMMUNITY): Payer: BC Managed Care – PPO

## 2019-02-16 ENCOUNTER — Other Ambulatory Visit: Payer: Self-pay

## 2019-02-16 ENCOUNTER — Ambulatory Visit (HOSPITAL_COMMUNITY)
Admission: RE | Admit: 2019-02-16 | Discharge: 2019-02-16 | Disposition: A | Discharge status: Home / Self Care | Payer: BC Managed Care – PPO | Source: Ambulatory Visit | Attending: Hematology and Oncology | Admitting: Hematology and Oncology

## 2019-02-16 DIAGNOSIS — C50411 Malignant neoplasm of upper-outer quadrant of right female breast: Secondary | ICD-10-CM | POA: Diagnosis not present

## 2019-02-16 MED ORDER — TECHNETIUM TC 99M MEDRONATE IV KIT
17.8000 | PACK | Freq: Once | INTRAVENOUS | Status: AC | PRN
  Administered 2019-02-16: 17.8 via INTRAVENOUS

## 2019-02-19 ENCOUNTER — Telehealth: Payer: Self-pay | Admitting: Hematology and Oncology

## 2019-02-19 NOTE — Telephone Encounter (Signed)
Bone Scan: Normal I informed her of these results.

## 2019-08-17 ENCOUNTER — Telehealth: Payer: Self-pay

## 2019-08-17 NOTE — Telephone Encounter (Signed)
Pt requesting RX be faxed to [place] for insurance to cover compression sleeve for lymphedema.    RN faxed Rx to [phone] successfully.  Pt aware.

## 2019-08-20 ENCOUNTER — Other Ambulatory Visit: Payer: Self-pay | Admitting: *Deleted

## 2019-08-20 DIAGNOSIS — C50411 Malignant neoplasm of upper-outer quadrant of right female breast: Secondary | ICD-10-CM

## 2019-08-20 NOTE — Progress Notes (Signed)
Pt requesting referral be placed to [facility] for lymphedema care.  Referral placed and called to confirm they are able to see referral in Epic.

## 2019-08-29 ENCOUNTER — Encounter: Payer: Self-pay | Admitting: Occupational Therapy

## 2019-08-29 ENCOUNTER — Other Ambulatory Visit: Payer: Self-pay

## 2019-08-29 ENCOUNTER — Ambulatory Visit: Payer: BC Managed Care – PPO | Attending: Hematology and Oncology | Admitting: Occupational Therapy

## 2019-08-29 DIAGNOSIS — M25611 Stiffness of right shoulder, not elsewhere classified: Secondary | ICD-10-CM | POA: Diagnosis present

## 2019-08-29 DIAGNOSIS — I972 Postmastectomy lymphedema syndrome: Secondary | ICD-10-CM | POA: Diagnosis present

## 2019-08-29 NOTE — Patient Instructions (Signed)
Keep bandages  On 48 hrs  Precautions provided and pt verbalize understanding Pt to do 3 x day  AROM for fisting, wrist and elbow flexion , ext and shoulder ABD/ D1/D2 patterns

## 2019-08-29 NOTE — Therapy (Signed)
[facility] [address] [place], [address] Phone: [phone]   Fax:  [phone]  Occupational Therapy Evaluation  Patient Details  Name: Kiara Spencer MRN: [id number] Date of Birth: Aug 17, 1971 No data recorded  Encounter Date: 08/29/2019  OT End of Session - 08/29/19 1854    Visit Number  1    Number of Visits  12    Date for OT Re-Evaluation  09/26/19    OT Start Time  1430    OT Stop Time  1530    OT Time Calculation (min)  60 min    Activity Tolerance  Patient tolerated treatment well    Behavior During Therapy  [facility] for tasks assessed/performed       Past Medical History:  Diagnosis Date  . Breast cancer ([place])   . Breast cancer of upper-outer quadrant of right female breast ([place]) 12/02/2015  . Headache    hx migraines    Past Surgical History:  Procedure Laterality Date  . BREAST RECONSTRUCTION WITH PLACEMENT OF TISSUE EXPANDER AND FLEX HD (ACELLULAR HYDRATED DERMIS) Bilateral 02/05/2016   Procedure: BILATERAL BREAST RECONSTRUCTION WITH PLACEMENT OF TISSUE EXPANDER AND FLEX HD (ACELLULAR HYDRATED DERMIS);  Surgeon: Loel Lofty Dillingham, DO;  Location: [place];  Service: Plastics;  Laterality: Bilateral;  . CESAREAN SECTION  x 2  . DILATION AND CURETTAGE OF UTERUS    . MASTECTOMY W/ SENTINEL NODE BIOPSY Right 02/05/2016   Procedure: RIGHT NIPPLE SPARING MASTECTOMY WITH RIGHT AXILLARY SENTINEL LYMPH NODE BIOPSY;  Surgeon: [name], MD;  Location: [place];  Service: General;  Laterality: Right;  . MASTECTOMY, PARTIAL Left 02/05/2016   Procedure: LEFT PROPHYLACTIC NIPPLE SPARING MASTECTOMY ;  Surgeon: [name], MD;  Location: [place];  Service: General;  Laterality: Left;  . PERIPHERAL VASCULAR CATHETERIZATION Right 09/16/2016   Procedure: PORTA CATH REMOVAL;  Surgeon: Wallace Going, DO;  Location: [place];  Service: Plastics;  Laterality: Right;  . PORTACATH PLACEMENT Right  03/02/2016   Procedure: INSERTION PORT-A-CATH WITH ULTRASOUND;  Surgeon: [name], MD;  Location: [place];  Service: General;  Laterality: Right;  IJ  . REMOVAL OF BILATERAL TISSUE EXPANDERS WITH PLACEMENT OF BILATERAL BREAST IMPLANTS Bilateral 09/16/2016   Procedure: REMOVAL OF BILATERAL TISSUE EXPANDERS WITH PLACEMENT OF BILATERAL BREAST IMPLANTS;  Surgeon: Wallace Going, DO;  Location: [place];  Service: Plastics;  Laterality: Bilateral;    There were no vitals filed for this visit.  Subjective Assessment - 08/29/19 1813    Subjective   After my radiation I had therapy for my  R shoulder and cording - and had some swelling but very little - got  gradually worse and the last 2 months really got worse - and staying swollen    Pertinent History  R breast CA in 2017 with mastectomy, and then recontruction - had chemo and radiation end Jan 2018 - pt report she had little bit of swelling 2018 - but did not need compression sleeve - and did not wear any sleeve since 2017    Patient Stated Goals  I want to know what I need to do to treat this , and take care of my arm so it can get smaller and keep it undercontrol    Currently in Pain?  No/denies        [facility] OT Assessment - 08/29/19 0001      Assessment   Medical Diagnosis  R  breast CA  with R lymphedema     Onset Date/Surgical Date  02/25/17    Hand Dominance  Right    Prior Therapy  --   PT for R shoulder post surgery 2018      Precautions   Precaution Comments  R UE lymphedema       Home  Environment   Lives With  Family      Prior Function   Vocation  Full time employment    Leisure  Educator, walk,  has dog,       AROM   Right Shoulder Flexion  145 Degrees    Right Shoulder ABduction  145 Degrees    Left Shoulder Flexion  180 Degrees    Left Shoulder ABduction  180 Degrees       LYMPHEDEMA/ONCOLOGY QUESTIONNAIRE - 08/29/19 1823      Surgeries   Mastectomy Date  --   2017    Saline Implant Reconstruction Date  --   2017     Date Lymphedema/Swelling Started   Date  --   2018     Treatment   Past Chemotherapy Treatment  --   2017   Past Radiation Treatment  --   end January 2018     What other symptoms do you have   Are you Having Heaviness or Tightness  Yes    Are you having Pain  Yes    Is it Hard or Difficult finding clothes that fit  Yes    Is there Decreased scar mobility  Yes      Lymphedema Stage   Stage  STAGE 2 SPONTANEOUSLY IRREVERSIBLE      Right Upper Extremity Lymphedema   15 cm Proximal to Olecranon Process  32 cm    10 cm Proximal to Olecranon Process  31.8 cm    Olecranon Process  25.5 cm    15 cm Proximal to Ulnar Styloid Process  25.5 cm    10 cm Proximal to Ulnar Styloid Process  20.8 cm    Just Proximal to Ulnar Styloid Process  15.2 cm    Across Hand at [employer]  18 cm    At [place] of 2nd Digit  6 cm    At [facility] of Thumb  6 cm      Left Upper Extremity Lymphedema   15 cm Proximal to Olecranon Process  30 cm    10 cm Proximal to Olecranon Process  29 cm    Olecranon Process  22.5 cm    15 cm Proximal to Ulnar Styloid Process  23 cm    10 cm Proximal to Ulnar Styloid Process  19 cm    Just Proximal to Ulnar Styloid Process  14 cm    Across Hand at [employer]  17 cm    At [place] of 2nd Digit  6 cm    At [facility] of Thumb  5.8 cm      Pt was provided with info on what lymphedema is and precaustions:        Eucerin lotionapplied on R UE  isotoner glove and stockinet - Artiflex 10 cm for hand to forearm  ,and 15 cm for hand toElbow  Short stretch 6 cm wrist<>hand to forearm Short stretch 8 cm hand to elbow And short stretch 10 cm for mid forearm to upper arm  Done tubigrip E as Topper to keep tape in tact and prevent sliding down of bandages   Review precautions for bandages  HEP for AROMreview  Fisting, Wrist and elbow flexion , ext, and horizontal ABD and ADD for R UE  10 reps 3 x day - or when  bandages feels tight         OT Education - 08/29/19 1853    Education Details  info on lymphedema,  POC , HEP    Person(s) Educated  Patient    Methods  Explanation;Demonstration;Tactile cues;Verbal cues;Handout    Comprehension  Returned demonstration;Verbalized understanding       OT Short Term Goals - 08/29/19 1911      OT SHORT TERM GOAL #1   Title  Pt R UE circumference decrease by 2 cm upper arm  and elbow, 1 cm at wrist to elbow  to be fitted wtih correct compressoin garments    Baseline  Pt R UE circumference increase by 2.8 cm upper arm, 3 cm elbow, forear, 2.5 , wrist 1.2 and hand 1 cm    Time  3    Period  Weeks    Status  New    Target Date  09/19/19        OT Long Term Goals - 08/29/19 1915      OT LONG TERM GOAL #1   Title  Pt and family to be independent in self MLD, soft tissue mobs , exercises for R shoulder and self bandaging    Baseline  no knowledge of HEP    Time  4    Period  Weeks    Status  New    Target Date  09/26/19      OT LONG TERM GOAL #2   Title  Pt to be independing in wearing correct compression garments for day and night time to keep R UE lymphedema under control    Baseline  never wore garments -    Time  4    Period  Weeks    Status  New    Target Date  09/26/19            Plan - 08/29/19 1857    Clinical Impression Statement  Pt present at OT eval with  Stage 2 R UE lymphedema stage with R UE circumference increase compare to L UE - upper arm increase by 2.8 cm , elbow 3 cm , forearm 2.5 cm and forearm 1.8 cm - pt cont to have tightness, scar tissue in axila and pect limiting her R UE shoulder AROM - pt can benefit from OT service.    OT Occupational Profile and History  Problem Focused Assessment - Including review of records relating to presenting problem    Occupational performance deficits (Please refer to evaluation for details):  ADL's;IADL's;Work;Play;Leisure    Body Structure / Function / Physical Skills  ADL;UE  functional use;ROM;Flexibility;Edema    Rehab Potential  Good    Clinical Decision Making  Limited treatment options, no task modification necessary    Comorbidities Affecting Occupational Performance:  None    Modification or Assistance to Complete Evaluation   No modification of tasks or assist necessary to complete eval    OT Frequency  3x / week    OT Duration  4 weeks    OT Treatment/Interventions  Self-care/ADL training;Manual lymph drainage;Patient/family education;Compression bandaging;Scar mobilization;Manual Therapy;Passive range of motion    Plan  circumference in R UE -and rebandage    OT Home Exercise Plan  see pt instruction    Consulted and Agree with Plan of Care  Patient  Patient will benefit from skilled therapeutic intervention in order to improve the following deficits and impairments:   Body Structure / Function / Physical Skills: ADL, UE functional use, ROM, Flexibility, Edema       Visit Diagnosis: Postmastectomy lymphedema syndrome - Plan: Ot plan of care cert/re-cert  Stiffness of right shoulder, not elsewhere classified - Plan: Ot plan of care cert/re-cert    Problem List Patient Active Problem List   Diagnosis Date Noted  . Hypersensitivity reaction 03/16/2016  . Other iron deficiency anemia 02/16/2016  . Genetic testing 12/22/2015  . Family history of cancer 12/22/2015  . Family history of breast cancer in mother 12/11/2015  . Breast cancer of upper-outer quadrant of right female breast ([place]) 12/02/2015    Rosalyn Gess OTR/L,CLT 08/29/2019, 7:21 PM  [facility] [address] [place], [address] Phone: [phone]   Fax:  [phone]  Name: Shakeara Font MRN: [id number] Date of Birth: 08-18-1971

## 2019-08-31 ENCOUNTER — Ambulatory Visit: Payer: BC Managed Care – PPO | Admitting: Occupational Therapy

## 2019-08-31 ENCOUNTER — Other Ambulatory Visit: Payer: Self-pay

## 2019-08-31 DIAGNOSIS — I972 Postmastectomy lymphedema syndrome: Secondary | ICD-10-CM | POA: Diagnosis not present

## 2019-08-31 DIAGNOSIS — M25611 Stiffness of right shoulder, not elsewhere classified: Secondary | ICD-10-CM

## 2019-08-31 NOTE — Therapy (Signed)
[facility] [address] [place], [address] Phone: [phone]   Fax:  [phone]  Occupational Therapy Treatment  Patient Details  Name: Kiara Spencer MRN: [id number] Date of Birth: 03/05/1971 No data recorded  Encounter Date: 08/31/2019  OT End of Session - 08/31/19 1214    Visit Number  2    Number of Visits  12    Date for OT Re-Evaluation  09/26/19    OT Start Time  0915    OT Stop Time  0945    OT Time Calculation (min)  30 min    Activity Tolerance  Patient tolerated treatment well    Behavior During Therapy  [facility] for tasks assessed/performed       Past Medical History:  Diagnosis Date  . Breast cancer ([place])   . Breast cancer of upper-outer quadrant of right female breast ([place]) 12/02/2015  . Headache    hx migraines    Past Surgical History:  Procedure Laterality Date  . BREAST RECONSTRUCTION WITH PLACEMENT OF TISSUE EXPANDER AND FLEX HD (ACELLULAR HYDRATED DERMIS) Bilateral 02/05/2016   Procedure: BILATERAL BREAST RECONSTRUCTION WITH PLACEMENT OF TISSUE EXPANDER AND FLEX HD (ACELLULAR HYDRATED DERMIS);  Surgeon: Loel Lofty Dillingham, DO;  Location: [place];  Service: Plastics;  Laterality: Bilateral;  . CESAREAN SECTION  x 2  . DILATION AND CURETTAGE OF UTERUS    . MASTECTOMY W/ SENTINEL NODE BIOPSY Right 02/05/2016   Procedure: RIGHT NIPPLE SPARING MASTECTOMY WITH RIGHT AXILLARY SENTINEL LYMPH NODE BIOPSY;  Surgeon: [name], MD;  Location: [place];  Service: General;  Laterality: Right;  . MASTECTOMY, PARTIAL Left 02/05/2016   Procedure: LEFT PROPHYLACTIC NIPPLE SPARING MASTECTOMY ;  Surgeon: [name], MD;  Location: [place];  Service: General;  Laterality: Left;  . PERIPHERAL VASCULAR CATHETERIZATION Right 09/16/2016   Procedure: PORTA CATH REMOVAL;  Surgeon: Wallace Going, DO;  Location: [place];  Service: Plastics;  Laterality: Right;  . PORTACATH PLACEMENT Right  03/02/2016   Procedure: INSERTION PORT-A-CATH WITH ULTRASOUND;  Surgeon: [name], MD;  Location: [place];  Service: General;  Laterality: Right;  IJ  . REMOVAL OF BILATERAL TISSUE EXPANDERS WITH PLACEMENT OF BILATERAL BREAST IMPLANTS Bilateral 09/16/2016   Procedure: REMOVAL OF BILATERAL TISSUE EXPANDERS WITH PLACEMENT OF BILATERAL BREAST IMPLANTS;  Surgeon: Wallace Going, DO;  Location: [place];  Service: Plastics;  Laterality: Bilateral;    There were no vitals filed for this visit.  Subjective Assessment - 08/31/19 1213    Subjective   My arm really itch at the forearm and elbow since last time - but otherwise did okay- bagged my hand to shower    Pertinent History  R breast CA in 2017 with mastectomy, and then recontruction - had chemo and radiation end Jan 2018 - pt report she had little bit of swelling 2018 - but did not need compression sleeve - and did not wear any sleeve since 2017    Patient Stated Goals  I want to know what I need to do to treat this , and take care of my arm so it can get smaller and keep it undercontrol    Currently in Pain?  No/denies          LYMPHEDEMA/ONCOLOGY QUESTIONNAIRE - 08/31/19 0929      Right Upper Extremity Lymphedema   15 cm Proximal to Olecranon Process  31.8 cm    10 cm Proximal to  Olecranon Process  31.5 cm    Olecranon Process  25.5 cm    15 cm Proximal to Ulnar Styloid Process  25.4 cm    10 cm Proximal to Ulnar Styloid Process  20 cm    Just Proximal to Ulnar Styloid Process  14.5 cm    Across Hand at [employer]  17.8 cm    At [place] of 2nd Digit  6 cm    At [facility] of Thumb  5.8 cm        Pt arrive with bandages in place - report some itching on forearm and elbow- did take some Benedryl - pt to bring some ointment in next time- to apply prior to bandaging     Bandages removed - and measurements taken - see flowsheet  Arm washed -and   Eucerin lotionapplied on R UE  isotoner  glove and  Switch pt to Protouch soft stockinet- Artiflex 10 cm for hand to forearm  ,and 15 cm for hand toElbow  Short stretch 6 cm wrist<>hand to forearm Short stretch 8 cm hand to elbow And short stretch 10 cm for mid forearm to upper arm Done tubigrip E as Topper to keep tape in tact and prevent sliding down of bandages   Review precautions for bandages  HEP for AROMreview  Fisting, Wrist and elbow flexion , ext, and horizontal ABD and ADD for R UE  10 reps 3 x day - or when bandages feels tight               OT Education - 08/31/19 1214    Education Details  bandaging and HEP    Person(s) Educated  Patient    Methods  Explanation;Demonstration;Tactile cues;Verbal cues;Handout    Comprehension  Returned demonstration;Verbalized understanding       OT Short Term Goals - 08/29/19 1911      OT SHORT TERM GOAL #1   Title  Pt R UE circumference decrease by 2 cm upper arm  and elbow, 1 cm at wrist to elbow  to be fitted wtih correct compressoin garments    Baseline  Pt R UE circumference increase by 2.8 cm upper arm, 3 cm elbow, forear, 2.5 , wrist 1.2 and hand 1 cm    Time  3    Period  Weeks    Status  New    Target Date  09/19/19        OT Long Term Goals - 08/29/19 1915      OT LONG TERM GOAL #1   Title  Pt and family to be independent in self MLD, soft tissue mobs , exercises for R shoulder and self bandaging    Baseline  no knowledge of HEP    Time  4    Period  Weeks    Status  New    Target Date  09/26/19      OT LONG TERM GOAL #2   Title  Pt to be independing in wearing correct compression garments for day and night time to keep R UE lymphedema under control    Baseline  never wore garments -    Time  4    Period  Weeks    Status  New    Target Date  09/26/19            Plan - 08/31/19 1215    Clinical Impression Statement  Pt is stage 2 lymphedema of R UE - pt show increase circumference  - did decrease  in hand to distal  forearm - but had some itching causing proximal foream , elbow and upper arm to not decongest - pt to take if can Benedryl - and can bring ointment in for OT to use next time prior to bandaging    OT Occupational Profile and History  Problem Focused Assessment - Including review of records relating to presenting problem    Occupational performance deficits (Please refer to evaluation for details):  ADL's;IADL's;Work;Play;Leisure    Body Structure / Function / Physical Skills  ADL;UE functional use;ROM;Flexibility;Edema    Rehab Potential  Good    Clinical Decision Making  Limited treatment options, no task modification necessary    Comorbidities Affecting Occupational Performance:  None    Modification or Assistance to Complete Evaluation   No modification of tasks or assist necessary to complete eval    OT Frequency  3x / week    OT Duration  4 weeks    OT Treatment/Interventions  Self-care/ADL training;Manual lymph drainage;Patient/family education;Compression bandaging;Scar mobilization;Manual Therapy;Passive range of motion    Plan  circumference in R UE and check in itching better with change of stockinette  -and rebandage    OT Home Exercise Plan  see pt instruction    Consulted and Agree with Plan of Care  Patient       Patient will benefit from skilled therapeutic intervention in order to improve the following deficits and impairments:   Body Structure / Function / Physical Skills: ADL, UE functional use, ROM, Flexibility, Edema       Visit Diagnosis: Postmastectomy lymphedema syndrome  Stiffness of right shoulder, not elsewhere classified    Problem List Patient Active Problem List   Diagnosis Date Noted  . Hypersensitivity reaction 03/16/2016  . Other iron deficiency anemia 02/16/2016  . Genetic testing 12/22/2015  . Family history of cancer 12/22/2015  . Family history of breast cancer in mother 12/11/2015  . Breast cancer of upper-outer quadrant of right female  breast ([place]) 12/02/2015    Rosalyn Gess OTR/L,CLT 08/31/2019, 12:17 PM  [facility] [address], [place], [address] Phone: [phone]   Fax:  [phone]  Name: Lizzieann Giovanni MRN: [id number] Date of Birth: October 24, 1971

## 2019-09-03 ENCOUNTER — Ambulatory Visit: Payer: BC Managed Care – PPO | Admitting: Occupational Therapy

## 2019-09-03 ENCOUNTER — Other Ambulatory Visit: Payer: Self-pay

## 2019-09-03 DIAGNOSIS — I972 Postmastectomy lymphedema syndrome: Secondary | ICD-10-CM | POA: Diagnosis not present

## 2019-09-03 DIAGNOSIS — M25611 Stiffness of right shoulder, not elsewhere classified: Secondary | ICD-10-CM

## 2019-09-04 NOTE — Therapy (Signed)
[facility] [address] [place], [address] Phone: [phone]   Fax:  [phone]  Occupational Therapy Treatment  Patient Details  Name: Kiara Spencer MRN: [id number] Date of Birth: 04-01-1971 No data recorded  Encounter Date: 09/03/2019  OT End of Session - 09/04/19 1957    Visit Number  3    Number of Visits  12    Date for OT Re-Evaluation  09/26/19    OT Start Time  1130    OT Stop Time  1205    OT Time Calculation (min)  35 min    Activity Tolerance  Patient tolerated treatment well    Behavior During Therapy  [facility] for tasks assessed/performed       Past Medical History:  Diagnosis Date  . Breast cancer ([place])   . Breast cancer of upper-outer quadrant of right female breast ([place]) 12/02/2015  . Headache    hx migraines    Past Surgical History:  Procedure Laterality Date  . BREAST RECONSTRUCTION WITH PLACEMENT OF TISSUE EXPANDER AND FLEX HD (ACELLULAR HYDRATED DERMIS) Bilateral 02/05/2016   Procedure: BILATERAL BREAST RECONSTRUCTION WITH PLACEMENT OF TISSUE EXPANDER AND FLEX HD (ACELLULAR HYDRATED DERMIS);  Surgeon: Loel Lofty Dillingham, DO;  Location: [place];  Service: Plastics;  Laterality: Bilateral;  . CESAREAN SECTION  x 2  . DILATION AND CURETTAGE OF UTERUS    . MASTECTOMY W/ SENTINEL NODE BIOPSY Right 02/05/2016   Procedure: RIGHT NIPPLE SPARING MASTECTOMY WITH RIGHT AXILLARY SENTINEL LYMPH NODE BIOPSY;  Surgeon: [name], MD;  Location: [place];  Service: General;  Laterality: Right;  . MASTECTOMY, PARTIAL Left 02/05/2016   Procedure: LEFT PROPHYLACTIC NIPPLE SPARING MASTECTOMY ;  Surgeon: [name], MD;  Location: [place];  Service: General;  Laterality: Left;  . PERIPHERAL VASCULAR CATHETERIZATION Right 09/16/2016   Procedure: PORTA CATH REMOVAL;  Surgeon: Wallace Going, DO;  Location: [place];  Service: Plastics;  Laterality: Right;  . PORTACATH PLACEMENT Right  03/02/2016   Procedure: INSERTION PORT-A-CATH WITH ULTRASOUND;  Surgeon: [name], MD;  Location: [place];  Service: General;  Laterality: Right;  IJ  . REMOVAL OF BILATERAL TISSUE EXPANDERS WITH PLACEMENT OF BILATERAL BREAST IMPLANTS Bilateral 09/16/2016   Procedure: REMOVAL OF BILATERAL TISSUE EXPANDERS WITH PLACEMENT OF BILATERAL BREAST IMPLANTS;  Surgeon: Wallace Going, DO;  Location: [place];  Service: Plastics;  Laterality: Bilateral;    There were no vitals filed for this visit.  Subjective Assessment - 09/04/19 1954    Subjective   Pt reports she had the isotoner glove on wrong side out and the seams were on the inside, this resulted in pressure to the forearm and an abrasion to the skin.  She did bring in anti itch cream this date. She had to have her husband try to help rewrap her and she ended up having to take the wraps off from being too tight, numbness and tingling which resolved after she removed the wraps.    Pertinent History  R breast CA in 2017 with mastectomy, and then recontruction - had chemo and radiation end Jan 2018 - pt report she had little bit of swelling 2018 - but did not need compression sleeve - and did not wear any sleeve since 2017    Patient Stated Goals  I want to know what I need to do to treat this , and take care of my arm so it can get  smaller and keep it undercontrol    Currently in Pain?  No/denies       Bandages were already removed when patient arrived,  Circumferential  measurements taken - see flowsheet for details.   Eucerin lotionappliedon R UE, patient applied anti itch cream to UE. isotoner glove applied with education regarding seams and Protouch soft stockinette used  Artiflex 10 cm for hand toforearm,and 15 cm for hand toElbow  Short stretch 6 cm wrist<>hand to forearm Short stretch 8 cm hand to elbow And short stretch 10 cm for mid forearm to upper arm Applied tubigrip E as Topper to  keep tape intact and prevent sliding down of bandages   Review precautions for bandages  HEP for AROMreview  Fisting, Wrist and elbow flexion , ext, and horizontal ABD and ADD for R UE  10 reps 3 x day - or when bandages feels tight. Patient was able to demonstrate exercises in the clinic.       LYMPHEDEMA/ONCOLOGY QUESTIONNAIRE - 09/04/19 2008      Right Upper Extremity Lymphedema   15 cm Proximal to Olecranon Process  31.5 cm    10 cm Proximal to Olecranon Process  32 cm    Olecranon Process  27 cm    15 cm Proximal to Ulnar Styloid Process  25.4 cm    10 cm Proximal to Ulnar Styloid Process  21 cm    Just Proximal to Ulnar Styloid Process  15.4 cm    Across Hand at [employer]  18.8 cm    At [place] of 2nd Digit  6.1 cm    At [facility] of Thumb  6.4 cm             Response to tx: Patient had some issues with her bandaging since last session, she reports wearing the compression glove with the seams towards the inside of the hand, resulted in pressure and skin irritation.When she realized this, she had her husband attempt to rebandage and the compression was too tight causing numbness and tingling, she removed the bandages and her circumferential measurements were increased from last session.  Patient was instructed on proper use of compression glove as well as bandaging principles and instructions on bandaging if needed at home.  Patient is aware to remove bandages if she has any numbness, tingling or irritation and call therapist.  Will monitor measurements and make adjustments to bandaging as needed.  May consider rosidal foam if measurements do not decrease next session.  Continue to work towards goals in plan of care to manage symptoms of lymphedema and prepare arm for compression garment fitting.         OT Education - 09/04/19 1956    Education Details  compression wrapping, proper use of compression glove    Person(s) Educated  Patient    Methods   Explanation;Demonstration;Tactile cues;Verbal cues;Handout    Comprehension  Returned demonstration;Verbalized understanding       OT Short Term Goals - 08/29/19 1911      OT SHORT TERM GOAL #1   Title  Pt R UE circumference decrease by 2 cm upper arm  and elbow, 1 cm at wrist to elbow  to be fitted wtih correct compressoin garments    Baseline  Pt R UE circumference increase by 2.8 cm upper arm, 3 cm elbow, forear, 2.5 , wrist 1.2 and hand 1 cm    Time  3    Period  Weeks    Status  New  Target Date  09/19/19        OT Long Term Goals - 08/29/19 1915      OT LONG TERM GOAL #1   Title  Pt and family to be independent in self MLD, soft tissue mobs , exercises for R shoulder and self bandaging    Baseline  no knowledge of HEP    Time  4    Period  Weeks    Status  New    Target Date  09/26/19      OT LONG TERM GOAL #2   Title  Pt to be independing in wearing correct compression garments for day and night time to keep R UE lymphedema under control    Baseline  never wore garments -    Time  4    Period  Weeks    Status  New    Target Date  09/26/19            Plan - 09/04/19 1958    Clinical Impression Statement  Patient had some issues with her bandaging since last session, she reports wearing the compression glove with the seams towards the inside of the hand, resulted in pressure and skin irritation.When she realized this, she had her husband attempt to rebandage and the compression was too tight causing numbness and tingling, she removed the bandages and her circumferential measurements were increased from last session.  Patient was instructed on proper use of compression glove as well as bandaging principles and instructions on bandaging if needed at home.  Patient is aware to remove bandages if she has any numbness, tingling or irritation and call therapist.  Will monitor measurements and make adjustments to bandaging as needed.  May consider rosidal foam if  measurements do not decrease next session.  Continue to work towards goals in plan of care to manage symptoms of lymphedema and prepare arm for compression garment fitting.    OT Occupational Profile and History  Problem Focused Assessment - Including review of records relating to presenting problem    Occupational performance deficits (Please refer to evaluation for details):  ADL's;IADL's;Work;Play;Leisure    Body Structure / Function / Physical Skills  ADL;UE functional use;ROM;Flexibility;Edema    Rehab Potential  Good    Clinical Decision Making  Limited treatment options, no task modification necessary    Comorbidities Affecting Occupational Performance:  None    Modification or Assistance to Complete Evaluation   No modification of tasks or assist necessary to complete eval    OT Frequency  3x / week    OT Treatment/Interventions  Self-care/ADL training;Manual lymph drainage;Patient/family education;Compression bandaging;Scar mobilization;Manual Therapy;Passive range of motion    Consulted and Agree with Plan of Care  Patient       Patient will benefit from skilled therapeutic intervention in order to improve the following deficits and impairments:   Body Structure / Function / Physical Skills: ADL, UE functional use, ROM, Flexibility, Edema       Visit Diagnosis: Postmastectomy lymphedema syndrome  Stiffness of right shoulder, not elsewhere classified    Problem List Patient Active Problem List   Diagnosis Date Noted  . Hypersensitivity reaction 03/16/2016  . Other iron deficiency anemia 02/16/2016  . Genetic testing 12/22/2015  . Family history of cancer 12/22/2015  . Family history of breast cancer in mother 12/11/2015  . Breast cancer of upper-outer quadrant of right female breast ([place]) 12/02/2015   Amy T Lovett, OTR/L, CLT  Lovett,Amy 09/04/2019, 8:09 PM  [place]  [facility] [address]  [place], [address] Phone: [phone]   Fax:  [phone]  Name: Kiara Spencer MRN: [id number] Date of Birth: [birth year]

## 2019-09-05 ENCOUNTER — Encounter: Payer: Self-pay | Admitting: Occupational Therapy

## 2019-09-05 ENCOUNTER — Other Ambulatory Visit: Payer: Self-pay

## 2019-09-05 ENCOUNTER — Ambulatory Visit: Payer: BC Managed Care – PPO | Admitting: Occupational Therapy

## 2019-09-05 DIAGNOSIS — M25611 Stiffness of right shoulder, not elsewhere classified: Secondary | ICD-10-CM

## 2019-09-05 DIAGNOSIS — I972 Postmastectomy lymphedema syndrome: Secondary | ICD-10-CM

## 2019-09-05 NOTE — Therapy (Signed)
[facility] [address] [place], [address] Phone: [phone]   Fax:  [phone]  Occupational Therapy Treatment  Patient Details  Name: Kiara Spencer MRN: [id number] Date of Birth: [birth year] No data recorded  Encounter Date: 09/05/2019  OT End of Session - 09/05/19 1658    Visit Number  4    Number of Visits  12    Date for OT Re-Evaluation  09/26/19    OT Start Time  1300    OT Stop Time  1355    OT Time Calculation (min)  55 min    Activity Tolerance  Patient tolerated treatment well    Behavior During Therapy  [facility] for tasks assessed/performed       Past Medical History:  Diagnosis Date  . Breast cancer ([place])   . Breast cancer of upper-outer quadrant of right female breast ([place]) 12/02/2015  . Headache    hx migraines    Past Surgical History:  Procedure Laterality Date  . BREAST RECONSTRUCTION WITH PLACEMENT OF TISSUE EXPANDER AND FLEX HD (ACELLULAR HYDRATED DERMIS) Bilateral 02/05/2016   Procedure: BILATERAL BREAST RECONSTRUCTION WITH PLACEMENT OF TISSUE EXPANDER AND FLEX HD (ACELLULAR HYDRATED DERMIS);  Surgeon: Loel Lofty Dillingham, DO;  Location: [place];  Service: Plastics;  Laterality: Bilateral;  . CESAREAN SECTION  x 2  . DILATION AND CURETTAGE OF UTERUS    . MASTECTOMY W/ SENTINEL NODE BIOPSY Right 02/05/2016   Procedure: RIGHT NIPPLE SPARING MASTECTOMY WITH RIGHT AXILLARY SENTINEL LYMPH NODE BIOPSY;  Surgeon: [name], MD;  Location: [place];  Service: General;  Laterality: Right;  . MASTECTOMY, PARTIAL Left 02/05/2016   Procedure: LEFT PROPHYLACTIC NIPPLE SPARING MASTECTOMY ;  Surgeon: [name], MD;  Location: [place];  Service: General;  Laterality: Left;  . PERIPHERAL VASCULAR CATHETERIZATION Right 09/16/2016   Procedure: PORTA CATH REMOVAL;  Surgeon: Wallace Going, DO;  Location: [place];  Service: Plastics;  Laterality: Right;  . PORTACATH PLACEMENT Right  03/02/2016   Procedure: INSERTION PORT-A-CATH WITH ULTRASOUND;  Surgeon: [name], MD;  Location: [place];  Service: General;  Laterality: Right;  IJ  . REMOVAL OF BILATERAL TISSUE EXPANDERS WITH PLACEMENT OF BILATERAL BREAST IMPLANTS Bilateral 09/16/2016   Procedure: REMOVAL OF BILATERAL TISSUE EXPANDERS WITH PLACEMENT OF BILATERAL BREAST IMPLANTS;  Surgeon: Wallace Going, DO;  Location: [place];  Service: Plastics;  Laterality: Bilateral;    There were no vitals filed for this visit.  Subjective Assessment - 09/05/19 1429    Subjective   Patient reports she was able to keep her bandages on, the last bandage started to come off so she readjusted.  Patient forgot to bring in her anti itch cream but reports it worked well last time.    Pertinent History  R breast CA in 2017 with mastectomy, and then recontruction - had chemo and radiation end Jan 2018 - pt report she had little bit of swelling 2018 - but did not need compression sleeve - and did not wear any sleeve since 2017    Patient Stated Goals  I want to know what I need to do to treat this , and take care of my arm so it can get smaller and keep it undercontrol    Currently in Pain?  No/denies    Multiple Pain Sites  No          LYMPHEDEMA/ONCOLOGY QUESTIONNAIRE - 09/05/19 1306  Right Upper Extremity Lymphedema   10 cm Proximal to Olecranon Process  31.3 cm  (Pended)     Olecranon Process  26.4 cm  (Pended)     15 cm Proximal to Ulnar Styloid Process  25.4 cm  (Pended)     10 cm Proximal to Ulnar Styloid Process  21 cm  (Pended)     Just Proximal to Ulnar Styloid Process  15.3 cm  (Pended)     Across Hand at [employer]  18.6 cm  (Pended)     At [place] of 2nd Digit  5.8 cm  (Pended)     At [facility] of Thumb  6 cm  (Pended)       Manual Therapy Patient arrived with bandages intact, bandages removed, skin inspected.  No issues except mild irritation area on forearm from last  session when she had the glove wrong side out, area improved this date.  No complaints.  She did forget to bring in anti itch cream.    Circumferential  measurements taken - see flowsheet for details.   Patient with decreases noted in select areas, forearm still up from last session with a pocket of fluid present ulnar side of forearm nearing elbow.   Application of manual lymphatic drainage techniques to patient's UE with associated pathways per clinical protocol .  Extensive Education to patient on purpose of MLD and how to perform self directed and/or assist from husband. Patient able to demonstrate understanding but will need additional focus to become proficient in use for home program.    Eucerin lotionappliedon R UE, patient applied anti itch cream to UE. isotoner glove applied with education regarding seams andProtouch softstockinette used  Artiflex 10 cm for hand toforearmswitched to rosidal foam for forearm and upper arm to help with decongestion of the area.    Short stretch 6 cm wrist<>hand to forearm Short stretch 8 cm hand to elbow(new bandage  Today) And short stretch 10 cm for mid forearm to upper arm Applied tubigrip E as Topper to keep tape intact and prevent sliding down of bandages   Review precautions for bandages  HEP for AROMreview  Fisting, Wrist and elbow flexion , ext, and horizontal ABD and ADD for R UE  10 reps 3 x day - or when bandages feels tight. Patient was able to demonstrate exercises in the clinic.     Response to tx:  Patient was noted to have some decongestion in UE in select areas however did still have a pocket of fluid in forearm, switched to rosidal foam this date for that area and did figure 8 wrap to forearm with 8 cm bandage.  Patient seen for initial instruction of MLD techniques and will require additional instruction for application to home program.  Continue to work towards decongestion of UE to prepare for fitting of compression  garments to manage symptoms of lymphedema.             OT Education - 09/05/19 1658    Education Details  manual lymphatic drainage techniques, compression wrapping    Person(s) Educated  Patient    Methods  Explanation;Demonstration;Tactile cues;Verbal cues;Handout    Comprehension  Returned demonstration;Verbalized understanding       OT Short Term Goals - 08/29/19 1911      OT SHORT TERM GOAL #1   Title  Pt R UE circumference decrease by 2 cm upper arm  and elbow, 1 cm at wrist to elbow  to be fitted wtih correct compressoin  garments    Baseline  Pt R UE circumference increase by 2.8 cm upper arm, 3 cm elbow, forear, 2.5 , wrist 1.2 and hand 1 cm    Time  3    Period  Weeks    Status  New    Target Date  09/19/19        OT Long Term Goals - 08/29/19 1915      OT LONG TERM GOAL #1   Title  Pt and family to be independent in self MLD, soft tissue mobs , exercises for R shoulder and self bandaging    Baseline  no knowledge of HEP    Time  4    Period  Weeks    Status  New    Target Date  09/26/19      OT LONG TERM GOAL #2   Title  Pt to be independing in wearing correct compression garments for day and night time to keep R UE lymphedema under control    Baseline  never wore garments -    Time  4    Period  Weeks    Status  New    Target Date  09/26/19            Plan - 09/05/19 1659    Clinical Impression Statement  Patient was noted to have some decongestion in UE in select areas however did still have a pocket of fluid in forearm, switched to rosidal foam this date for that area and did figure 8 wrap to forearm with 8 cm bandage.  Patient seen for initial instruction of MLD techniques and will require additional instruction for application to home program.  Continue to work towards decongestion of UE to prepare for fitting of compression garments to manage symptoms of lymphedema.    OT Occupational Profile and History  Problem Focused Assessment -  Including review of records relating to presenting problem    Occupational performance deficits (Please refer to evaluation for details):  ADL's;IADL's;Work;Play;Leisure    Body Structure / Function / Physical Skills  ADL;UE functional use;ROM;Flexibility;Edema    Rehab Potential  Good    Clinical Decision Making  Limited treatment options, no task modification necessary    Comorbidities Affecting Occupational Performance:  None    Modification or Assistance to Complete Evaluation   No modification of tasks or assist necessary to complete eval    OT Frequency  3x / week    OT Duration  4 weeks    OT Treatment/Interventions  Self-care/ADL training;Manual lymph drainage;Patient/family education;Compression bandaging;Scar mobilization;Manual Therapy;Passive range of motion    Consulted and Agree with Plan of Care  Patient       Patient will benefit from skilled therapeutic intervention in order to improve the following deficits and impairments:   Body Structure / Function / Physical Skills: ADL, UE functional use, ROM, Flexibility, Edema       Visit Diagnosis: Postmastectomy lymphedema syndrome  Stiffness of right shoulder, not elsewhere classified    Problem List Patient Active Problem List   Diagnosis Date Noted  . Hypersensitivity reaction 03/16/2016  . Other iron deficiency anemia 02/16/2016  . Genetic testing 12/22/2015  . Family history of cancer 12/22/2015  . Family history of breast cancer in mother 12/11/2015  . Breast cancer of upper-outer quadrant of right female breast ([place]) 12/02/2015   Kiara Spencer, Kiara Spencer, Kiara Spencer  Kiara Spencer 09/05/2019, 5:10 PM  [facility] [address],  [place], [address] Phone: [phone]   Fax:  [phone]  Name: Kiara Spencer MRN: [id number] Date of Birth: 1971/06/05

## 2019-09-07 ENCOUNTER — Encounter: Payer: Self-pay | Admitting: Occupational Therapy

## 2019-09-07 ENCOUNTER — Other Ambulatory Visit: Payer: Self-pay

## 2019-09-07 ENCOUNTER — Ambulatory Visit: Payer: BC Managed Care – PPO | Attending: Hematology and Oncology | Admitting: Occupational Therapy

## 2019-09-07 DIAGNOSIS — M25611 Stiffness of right shoulder, not elsewhere classified: Secondary | ICD-10-CM | POA: Diagnosis present

## 2019-09-07 DIAGNOSIS — I972 Postmastectomy lymphedema syndrome: Secondary | ICD-10-CM

## 2019-09-07 NOTE — Therapy (Signed)
[place] MAIN [facility] SERVICES [address] [place], [place], [address] Phone: [phone]   Fax:  [phone]  Occupational Therapy Treatment  Patient Details  Name: Kiara Spencer MRN: [id number] Date of Birth: 11-30-1971 No data recorded  Encounter Date: 09/07/2019  OT End of Session - 09/07/19 1813    Visit Number  5    Number of Visits  12    Date for OT Re-Evaluation  09/26/19    OT Start Time  1101    OT Stop Time  1135    OT Time Calculation (min)  34 min    Activity Tolerance  Patient tolerated treatment well    Behavior During Therapy  [facility] for tasks assessed/performed       Past Medical History:  Diagnosis Date  . Breast cancer ([place])   . Breast cancer of upper-outer quadrant of right female breast ([place]) 12/02/2015  . Headache    hx migraines    Past Surgical History:  Procedure Laterality Date  . BREAST RECONSTRUCTION WITH PLACEMENT OF TISSUE EXPANDER AND FLEX HD (ACELLULAR HYDRATED DERMIS) Bilateral 02/05/2016   Procedure: BILATERAL BREAST RECONSTRUCTION WITH PLACEMENT OF TISSUE EXPANDER AND FLEX HD (ACELLULAR HYDRATED DERMIS);  Surgeon: Loel Lofty Dillingham, DO;  Location: [place];  Service: Plastics;  Laterality: Bilateral;  . CESAREAN SECTION  x 2  . DILATION AND CURETTAGE OF UTERUS    . MASTECTOMY W/ SENTINEL NODE BIOPSY Right 02/05/2016   Procedure: RIGHT NIPPLE SPARING MASTECTOMY WITH RIGHT AXILLARY SENTINEL LYMPH NODE BIOPSY;  Surgeon: [name], MD;  Location: [place];  Service: General;  Laterality: Right;  . MASTECTOMY, PARTIAL Left 02/05/2016   Procedure: LEFT PROPHYLACTIC NIPPLE SPARING MASTECTOMY ;  Surgeon: [name], MD;  Location: [place];  Service: General;  Laterality: Left;  . PERIPHERAL VASCULAR CATHETERIZATION Right 09/16/2016   Procedure: PORTA CATH REMOVAL;  Surgeon: Wallace Going, DO;  Location: [place];  Service: Plastics;  Laterality: Right;  . PORTACATH PLACEMENT Right  03/02/2016   Procedure: INSERTION PORT-A-CATH WITH ULTRASOUND;  Surgeon: [name], MD;  Location: [place];  Service: General;  Laterality: Right;  IJ  . REMOVAL OF BILATERAL TISSUE EXPANDERS WITH PLACEMENT OF BILATERAL BREAST IMPLANTS Bilateral 09/16/2016   Procedure: REMOVAL OF BILATERAL TISSUE EXPANDERS WITH PLACEMENT OF BILATERAL BREAST IMPLANTS;  Surgeon: Wallace Going, DO;  Location: [place];  Service: Plastics;  Laterality: Bilateral;    There were no vitals filed for this visit.  Subjective Assessment - 09/07/19 1811    Subjective   Patient reports she is doing well, had one area that felt sore on her arm this time.  She remembered to bring in her anti itch cream this date. She reports she does not have time for MLD since she is on her lunch hour and needs to get back to work.    Pertinent History  R breast CA in 2017 with mastectomy, and then recontruction - had chemo and radiation end Jan 2018 - pt report she had little bit of swelling 2018 - but did not need compression sleeve - and did not wear any sleeve since 2017    Patient Stated Goals  I want to know what I need to do to treat this , and take care of my arm so it can get smaller and keep it undercontrol    Currently in Pain?  No/denies    Multiple Pain Sites  No  LYMPHEDEMA/ONCOLOGY QUESTIONNAIRE - 09/07/19 1112      Right Upper Extremity Lymphedema   15 cm Proximal to Olecranon Process  31 cm  (Pended)     10 cm Proximal to Olecranon Process  31.2 cm  (Pended)     Olecranon Process  26.4 cm  (Pended)     15 cm Proximal to Ulnar Styloid Process  25.2 cm  (Pended)     10 cm Proximal to Ulnar Styloid Process  20.8 cm  (Pended)     Just Proximal to Ulnar Styloid Process  15 cm  (Pended)     Across Hand at [employer]  18.2 cm  (Pended)     At [place] of 2nd Digit  5.8 cm  (Pended)     At [facility] of Thumb  6.2 cm  (Pended)       Bandages removed, skin inspected, one  area of irritation and appears it is from the inside of the compression glove where a tag was removed.    Circumferential measurements taken - see flowsheet for details.   Eucerin lotionappliedon R UE, patient applied anti itch cream to UE. Switched to another brand of compression gloveappliedandProtouch softstockinette used Artiflex 10 cm for hand toforearmswitched to rosidal foam for forearm and upper arm to help with decongestion of the area.    Short stretch 6 cm wrist<>hand to forearm Short stretch 8 cm hand to elbow(new bandage  Today) And short stretch 10 cm for mid forearm to upper arm Appliedtubigrip E as Topper to keep tape intact and prevent sliding down of bandages   Review precautions for bandages  HEP for AROMreview  Fisting, Wrist and elbow flexion , ext, and horizontal ABD and ADD for R UE  10 reps 3 x day - or when bandages feels tight. Patient was able to demonstrate exercises in the clinic.   Response to tx:   Patient demonstrated decreases in areas noted above in flow sheet.  Patient reports some mild itching this time, she forgot her cream when wrapping the other day but remembered to bring it this date.  Patient did not have time for MLD this date but was instructed last session and will likely need reinstruction to become proficient in application for self home program.  Patient with sensitive skin and has had some issues with pressure from seams and an area where a tag was removed inside her glove.  Switched to an alternative brand of glove with no tags and less seams.  Patient aware she may need to rewrap if bandages loosen over the weekend or if she has any irritation.  Continue to work towards goals in plan of care to manage symptoms of lymphedema and prepare arm for compression garment fitting.             OT Education - 09/07/19 1813    Education Details  compression wrapping    Person(s) Educated  Patient    Methods   Explanation;Demonstration;Tactile cues;Verbal cues;Handout    Comprehension  Returned demonstration;Verbalized understanding       OT Short Term Goals - 08/29/19 1911      OT SHORT TERM GOAL #1   Title  Pt R UE circumference decrease by 2 cm upper arm  and elbow, 1 cm at wrist to elbow  to be fitted wtih correct compressoin garments    Baseline  Pt R UE circumference increase by 2.8 cm upper arm, 3 cm elbow, forear, 2.5 , wrist 1.2 and hand 1 cm  Time  3    Period  Weeks    Status  New    Target Date  09/19/19        OT Long Term Goals - 08/29/19 1915      OT LONG TERM GOAL #1   Title  Pt and family to be independent in self MLD, soft tissue mobs , exercises for R shoulder and self bandaging    Baseline  no knowledge of HEP    Time  4    Period  Weeks    Status  New    Target Date  09/26/19      OT LONG TERM GOAL #2   Title  Pt to be independing in wearing correct compression garments for day and night time to keep R UE lymphedema under control    Baseline  never wore garments -    Time  4    Period  Weeks    Status  New    Target Date  09/26/19            Plan - 09/07/19 1814    OT Occupational Profile and History  Problem Focused Assessment - Including review of records relating to presenting problem    Occupational performance deficits (Please refer to evaluation for details):  ADL's;IADL's;Work;Play;Leisure    Body Structure / Function / Physical Skills  ADL;UE functional use;ROM;Flexibility;Edema    Rehab Potential  Good    Clinical Decision Making  Limited treatment options, no task modification necessary    Comorbidities Affecting Occupational Performance:  None    Modification or Assistance to Complete Evaluation   No modification of tasks or assist necessary to complete eval    OT Frequency  3x / week    OT Duration  4 weeks    OT Treatment/Interventions  Self-care/ADL training;Manual lymph drainage;Patient/family education;Compression bandaging;Scar  mobilization;Manual Therapy;Passive range of motion    Consulted and Agree with Plan of Care  Patient       Patient will benefit from skilled therapeutic intervention in order to improve the following deficits and impairments:   Body Structure / Function / Physical Skills: ADL, UE functional use, ROM, Flexibility, Edema       Visit Diagnosis: Postmastectomy lymphedema syndrome  Stiffness of right shoulder, not elsewhere classified    Problem List Patient Active Problem List   Diagnosis Date Noted  . Hypersensitivity reaction 03/16/2016  . Other iron deficiency anemia 02/16/2016  . Genetic testing 12/22/2015  . Family history of cancer 12/22/2015  . Family history of breast cancer in mother 12/11/2015  . Breast cancer of upper-outer quadrant of right female breast ([place]) 12/02/2015   Amy T Tomasita Morrow, OTR/L, CLT  Lovett,Amy 09/07/2019, 6:15 PM  [place] MAIN [facility] SERVICES [place], [place], [address] Phone: [phone]   Fax:  [phone]  Name: Umika Brena MRN: [id number] Date of Birth: 1971-10-25

## 2019-09-10 ENCOUNTER — Other Ambulatory Visit: Payer: Self-pay

## 2019-09-10 ENCOUNTER — Ambulatory Visit: Payer: BC Managed Care – PPO | Admitting: Occupational Therapy

## 2019-09-10 DIAGNOSIS — M25611 Stiffness of right shoulder, not elsewhere classified: Secondary | ICD-10-CM

## 2019-09-10 DIAGNOSIS — I972 Postmastectomy lymphedema syndrome: Secondary | ICD-10-CM | POA: Diagnosis not present

## 2019-09-10 NOTE — Patient Instructions (Signed)
Ed pt on manual lymph drainage once each day to help decrease swelling.  This should take you about 30 minutes depending on the size of your limb.  For Right Arm:  Felicie Morn yourself at the base of your neck and do 8 small circles, and 2 fingers behind clavicle 8 x  . Do 8 semicircles at left armpit and right groin . Pump across chest from right to left 8 times . Pump down the right side of trunk from armpit to groin 8 times . Pump up the outside of right upper arm 8 times,  . Pump across chest from right to left 8 times . Pump down the right side of trunk from armpit to groin 8 times . Do 8 semicircles at left armpit and right groin 8 times . Repeat nr.1  SLOW and LIGHT with only your palm NOT FINGERTIPS If help at home can replace or do also massage over upper back from right to left , when doing chest from right to left.     and bandages precautions  And AROM to R arm

## 2019-09-10 NOTE — Therapy (Signed)
[place] [facility] [address], [place], [address] Phone: [phone]   Fax:  [phone]  Occupational Therapy Treatment  Patient Details  Name: Kiara Spencer MRN: [id number] Date of Birth: [birth year] No data recorded  Encounter Date: 09/10/2019  OT End of Session - 09/10/19 1636    Visit Number  6    Number of Visits  12    Date for OT Re-Evaluation  09/26/19    OT Start Time  1430    OT Stop Time  1524    OT Time Calculation (min)  54 min    Activity Tolerance  Patient tolerated treatment well    Behavior During Therapy  [facility] for tasks assessed/performed       Past Medical History:  Diagnosis Date  . Breast cancer ([place])   . Breast cancer of upper-outer quadrant of right female breast ([place]) 12/02/2015  . Headache    hx migraines    Past Surgical History:  Procedure Laterality Date  . BREAST RECONSTRUCTION WITH PLACEMENT OF TISSUE EXPANDER AND FLEX HD (ACELLULAR HYDRATED DERMIS) Bilateral 02/05/2016   Procedure: BILATERAL BREAST RECONSTRUCTION WITH PLACEMENT OF TISSUE EXPANDER AND FLEX HD (ACELLULAR HYDRATED DERMIS);  Surgeon: Loel Lofty Dillingham, DO;  Location: [place];  Service: Plastics;  Laterality: Bilateral;  . CESAREAN SECTION  x 2  . DILATION AND CURETTAGE OF UTERUS    . MASTECTOMY W/ SENTINEL NODE BIOPSY Right 02/05/2016   Procedure: RIGHT NIPPLE SPARING MASTECTOMY WITH RIGHT AXILLARY SENTINEL LYMPH NODE BIOPSY;  Surgeon: [name], MD;  Location: [place];  Service: General;  Laterality: Right;  . MASTECTOMY, PARTIAL Left 02/05/2016   Procedure: LEFT PROPHYLACTIC NIPPLE SPARING MASTECTOMY ;  Surgeon: [name], MD;  Location: [place];  Service: General;  Laterality: Left;  . PERIPHERAL VASCULAR CATHETERIZATION Right 09/16/2016   Procedure: PORTA CATH REMOVAL;  Surgeon: Wallace Going, DO;  Location: [place];  Service: Plastics;  Laterality: Right;  . PORTACATH PLACEMENT Right  03/02/2016   Procedure: INSERTION PORT-A-CATH WITH ULTRASOUND;  Surgeon: [name], MD;  Location: [place];  Service: General;  Laterality: Right;  IJ  . REMOVAL OF BILATERAL TISSUE EXPANDERS WITH PLACEMENT OF BILATERAL BREAST IMPLANTS Bilateral 09/16/2016   Procedure: REMOVAL OF BILATERAL TISSUE EXPANDERS WITH PLACEMENT OF BILATERAL BREAST IMPLANTS;  Surgeon: Wallace Going, DO;  Location: [place];  Service: Plastics;  Laterality: Bilateral;    There were no vitals filed for this visit.  Subjective Assessment - 09/10/19 1632    Subjective   My bandages slipped down and then I had issues with the glove last week causing some irritation - first inner seam and then where the tab was cut off    Pertinent History  R breast CA in 2017 with mastectomy, and then recontruction - had chemo and radiation end Jan 2018 - pt report she had little bit of swelling 2018 - but did not need compression sleeve - and did not wear any sleeve since 2017    Patient Stated Goals  I want to know what I need to do to treat this , and take care of my arm so it can get smaller and keep it undercontrol    Currently in Pain?  No/denies          LYMPHEDEMA/ONCOLOGY QUESTIONNAIRE - 09/10/19 1435      Right Upper Extremity Lymphedema   15 cm Proximal to Olecranon Process  31.2 cm  10 cm Proximal to Olecranon Process  30.8 cm    Olecranon Process  25 cm    15 cm Proximal to Ulnar Styloid Process  25.2 cm    10 cm Proximal to Ulnar Styloid Process  21.4 cm    Just Proximal to Ulnar Styloid Process  14.7 cm    Across Hand at [employer]  18.2 cm    At [place] of 2nd Digit  5.8 cm    At [facility] of Thumb  6.2 cm       Pt arrive with bandages sliding down- measurements did decrease in elbow and distal upper arm-   Pt with some itching in elbow , inner upperarm- did apply some cortizone ointment with bandaging   Done some  manual lymph drainage  And pt to do once each day to  help decrease swelling.  This should take you about 20 minutes depending on the size of your limb.  but pt do not have to do the arm part - only upper arm while bandaged  For Right Arm:  Felicie Morn yourself at the base of your neck and do 8 small circles, and 2 fingers behind clavicle 8 x  . Do 8 semicircles at left armpit and right groin . Pump across chest from right to left 8 times . Pump down the right side of trunk from armpit to groin 8 times . Pump up the outside of right upper arm 8 times, inside of upper arm to outside 8x, outside of upper arm again 8x  . Pump across chest from R to L 8 times . Pump  down the right side of trunk from armpit to groin 8 times . Pump top of forearm from wrist to elbow 8 times . Pump up the outside of right upper arm 8 times . Pump across chest from right to left 8 times . Pump down the right side of trunk from armpit to groin 8 times . Pump up the back of the forearm from wrist to elbow 8 times . Pump up the outside of right upper arm 8 times . Pump across chest from right to left 8 times . Pump down the right side of trunk from armpit to groin 8 times . Do 8 semicircles at left armpit and right groin 8 times . Repeat nr.1  SLOW and LIGHT with only your palm NOT FINGERTIPS If help at home can replace or do also massage over upper back from right to left , when doing chest from right to left.   Bandages remove prior to taking measurements  And pt wash arm      Eucerin lotionapplied on R UE  isotoner glove and stockinet - soft protouch- Artiflex 10 cm for hand to forearm  ,and 15 cm for hand toElbow  Rosidal foam from wrist to axilla Short stretch 6 cm wrist<>hand to forearm Short stretch 8 cm hand to elbow And short stretch 10 cm for mid forearm to upper arm Done tubigrip E as Topper to keep tape in tact and prevent sliding down of bandages   Review precautions for bandages  HEP for AROMreview  Fisting, Wrist and elbow flexion ,  ext, and horizontal ABD and ADD for R UE  10 reps 3 x day - or when bandages feels tight            OT Education - 09/10/19 1635    Education Details  MLD to do at home -and bandages POC  Person(s) Educated  Patient    Methods  Explanation;Demonstration;Tactile cues;Verbal cues;Handout    Comprehension  Returned demonstration;Verbalized understanding       OT Short Term Goals - 08/29/19 1911      OT SHORT TERM GOAL #1   Title  Pt R UE circumference decrease by 2 cm upper arm  and elbow, 1 cm at wrist to elbow  to be fitted wtih correct compressoin garments    Baseline  Pt R UE circumference increase by 2.8 cm upper arm, 3 cm elbow, forear, 2.5 , wrist 1.2 and hand 1 cm    Time  3    Period  Weeks    Status  New    Target Date  09/19/19        OT Long Term Goals - 08/29/19 1915      OT LONG TERM GOAL #1   Title  Pt and family to be independent in self MLD, soft tissue mobs , exercises for R shoulder and self bandaging    Baseline  no knowledge of HEP    Time  4    Period  Weeks    Status  New    Target Date  09/26/19      OT LONG TERM GOAL #2   Title  Pt to be independing in wearing correct compression garments for day and night time to keep R UE lymphedema under control    Baseline  never wore garments -    Time  4    Period  Weeks    Status  New    Target Date  09/26/19            Plan - 09/10/19 1636    Clinical Impression Statement  Pt circumference did show decrease at elbow and distal upper arm- but all others appear to be increase since eval - could be because of some allergic reaction at [facility] to stockinette, some itching ,and reactions to seam/tag of glove- did change pt to Rosidal foam last visit by other OT -review and taught pt self MLD this date - pt to do short neck and chest , trunk - will cont to decongest R UE    OT Occupational Profile and History  Problem Focused Assessment - Including review of records relating to presenting problem     Occupational performance deficits (Please refer to evaluation for details):  ADL's;IADL's;Work;Play;Leisure    Body Structure / Function / Physical Skills  ADL;UE functional use;ROM;Flexibility;Edema    Rehab Potential  Good    Clinical Decision Making  Limited treatment options, no task modification necessary    Comorbidities Affecting Occupational Performance:  None    Modification or Assistance to Complete Evaluation   No modification of tasks or assist necessary to complete eval    OT Frequency  3x / week    OT Duration  4 weeks    OT Treatment/Interventions  Self-care/ADL training;Manual lymph drainage;Patient/family education;Compression bandaging;Scar mobilization;Manual Therapy;Passive range of motion    Plan  circumference in R UE and check in itching better with change of stockinette  -and rebandage    OT Home Exercise Plan  see pt instruction    Consulted and Agree with Plan of Care  Patient       Patient will benefit from skilled therapeutic intervention in order to improve the following deficits and impairments:   Body Structure / Function / Physical Skills: ADL, UE functional use, ROM, Flexibility, Edema       Visit Diagnosis:  Stiffness of right shoulder, not elsewhere classified  Postmastectomy lymphedema syndrome    Problem List Patient Active Problem List   Diagnosis Date Noted  . Hypersensitivity reaction 03/16/2016  . Other iron deficiency anemia 02/16/2016  . Genetic testing 12/22/2015  . Family history of cancer 12/22/2015  . Family history of breast cancer in mother 12/11/2015  . Breast cancer of upper-outer quadrant of right female breast ([place]) 12/02/2015    Rosalyn Gess OTR/l,CLT 09/10/2019, 4:39 PM  [place] [facility] [address], [place], [address] Phone: [phone]   Fax:  [phone]  Name: Kiara Spencer MRN: [id number] Date of Birth: May 30, 1971

## 2019-09-12 ENCOUNTER — Ambulatory Visit: Payer: BC Managed Care – PPO | Admitting: Occupational Therapy

## 2019-09-12 ENCOUNTER — Other Ambulatory Visit: Payer: Self-pay

## 2019-09-12 DIAGNOSIS — I972 Postmastectomy lymphedema syndrome: Secondary | ICD-10-CM | POA: Diagnosis not present

## 2019-09-12 DIAGNOSIS — M25611 Stiffness of right shoulder, not elsewhere classified: Secondary | ICD-10-CM

## 2019-09-12 NOTE — Therapy (Signed)
[facility] [address] [place], [address] Phone: [phone]   Fax:  [phone]  Occupational Therapy Treatment  Patient Details  Name: Kiara Spencer MRN: [id number] Date of Birth: [birth year] No data recorded  Encounter Date: 09/12/2019  OT End of Session - 09/12/19 1534    Visit Number  7    Number of Visits  12    Date for OT Re-Evaluation  09/26/19    OT Start Time  1434    OT Stop Time  1527    OT Time Calculation (min)  53 min    Activity Tolerance  Patient tolerated treatment well    Behavior During Therapy  [facility] for tasks assessed/performed       Past Medical History:  Diagnosis Date  . Breast cancer ([place])   . Breast cancer of upper-outer quadrant of right female breast ([place]) 12/02/2015  . Headache    hx migraines    Past Surgical History:  Procedure Laterality Date  . BREAST RECONSTRUCTION WITH PLACEMENT OF TISSUE EXPANDER AND FLEX HD (ACELLULAR HYDRATED DERMIS) Bilateral 02/05/2016   Procedure: BILATERAL BREAST RECONSTRUCTION WITH PLACEMENT OF TISSUE EXPANDER AND FLEX HD (ACELLULAR HYDRATED DERMIS);  Surgeon: Loel Lofty Dillingham, DO;  Location: [facility];  Service: Plastics;  Laterality: Bilateral;  . CESAREAN SECTION  x 2  . DILATION AND CURETTAGE OF UTERUS    . MASTECTOMY W/ SENTINEL NODE BIOPSY Right 02/05/2016   Procedure: RIGHT NIPPLE SPARING MASTECTOMY WITH RIGHT AXILLARY SENTINEL LYMPH NODE BIOPSY;  Surgeon: [name], MD;  Location: [facility];  Service: General;  Laterality: Right;  . MASTECTOMY, PARTIAL Left 02/05/2016   Procedure: LEFT PROPHYLACTIC NIPPLE SPARING MASTECTOMY ;  Surgeon: [name], MD;  Location: [facility];  Service: General;  Laterality: Left;  . PERIPHERAL VASCULAR CATHETERIZATION Right 09/16/2016   Procedure: PORTA CATH REMOVAL;  Surgeon: Wallace Going, DO;  Location: [facility];  Service: Plastics;  Laterality: Right;  . PORTACATH PLACEMENT Right  03/02/2016   Procedure: INSERTION PORT-A-CATH WITH ULTRASOUND;  Surgeon: [name], MD;  Location: [facility];  Service: General;  Laterality: Right;  IJ  . REMOVAL OF BILATERAL TISSUE EXPANDERS WITH PLACEMENT OF BILATERAL BREAST IMPLANTS Bilateral 09/16/2016   Procedure: REMOVAL OF BILATERAL TISSUE EXPANDERS WITH PLACEMENT OF BILATERAL BREAST IMPLANTS;  Surgeon: Wallace Going, DO;  Location: [facility];  Service: Plastics;  Laterality: Bilateral;    There were no vitals filed for this visit.  Subjective Assessment - 09/12/19 1532    Subjective   Did great - did not itch at all - and slide down only little - but did come loose at the back of my elbow    Pertinent History  R breast CA in 2017 with mastectomy, and then recontruction - had chemo and radiation end Jan 2018 - pt report she had little bit of swelling 2018 - but did not need compression sleeve - and did not wear any sleeve since 2017    Patient Stated Goals  I want to know what I need to do to treat this , and take care of my arm so it can get smaller and keep it undercontrol    Currently in Pain?  No/denies          LYMPHEDEMA/ONCOLOGY QUESTIONNAIRE - 09/12/19 1438      Right Upper Extremity Lymphedema   15 cm Proximal to Olecranon Process  31.7 cm    10  cm Proximal to Olecranon Process  31 cm    Olecranon Process  25 cm    15 cm Proximal to Ulnar Styloid Process  24.5 cm    10 cm Proximal to Ulnar Styloid Process  21.2 cm    Just Proximal to Ulnar Styloid Process  14.5 cm    Across Hand at [employer]  17 cm    At [place] of 2nd Digit  5.5 cm    At [facility]  6.2 cm       Pt arrive with bandages - come loose at posterior elbow  measurements did decrease - see flowsheet   Pt  Report no itching  did apply some cortizone ointment again this date with bandaging   Pt ed last time on doing some  manual lymph drainage  And pt to do once each day to help decrease swelling.  This  should take you about 20 minutes depending on the size of your limb.  but pt do not have to do the arm part - only upper arm while bandaged  For Right Arm:   Hug yourself at the base of your neck and do 8 small circles, and 2 fingers behind clavicle 8 x   Do 8 semicircles at left armpit and right groin  Pump across chest from right to left 8 times  Pump down the right side of trunk from armpit to groin 8 times  Pump up the outside of right upper arm 8 times, inside of upper arm to outside 8x, outside of upper arm again 8x   Pump across chest from R to L 8 times  Pump  down the right side of trunk from armpit to groin 8 times  Pump top of forearm from wrist to elbow 8 times  Pump up the outside of right upper arm 8 times  Pump across chest from right to left 8 times  Pump down the right side of trunk from armpit to groin 8 times  Pump up the back of the forearm from wrist to elbow 8 times  Pump up the outside of right upper arm 8 times  Pump across chest from right to left 8 times  Pump down the right side of trunk from armpit to groin 8 times  Do 8 semicircles at left armpit and right groin 8 times  Repeat nr.1  SLOW and LIGHT with only your palm NOT FINGERTIPS If help at home can replace or do also massage over upper back from right to left , when doing chest from right to left.    Scar massage done this date in axilla - scar mobs and pt to do every day 3-5 min - x 3 day  pect stretches gentle in cornes - hold 5 sec 10 reps  And scapula squeezes  3 x day 10 reps    Bandages remove prior to taking measurements  And pt wash arm      Eucerin lotionappliedon R UE isotoner glove and stockinet - soft protouch- Artiflex 10 cm for hand toforearm,and 15 cm for hand toElbow  Rosidal foam from wrist to axilla Short stretch 6 cm wrist<>hand to forearm Short stretch 8 cm hand to elbow And short stretch 10 cm for mid forearm to upper arm Done tubigrip  E as Topper to keep tape in tact and prevent sliding down of bandages   Review precautions for bandages  HEP for AROMreview  Fisting, Wrist and elbow flexion , ext, and horizontal ABD  and ADD for R UE  10 reps 3 x day - or when bandages feels tight                OT Education - 09/12/19 1534    Education Details  scar massage and husband can redo top bandage at upper arm    Person(s) Educated  Patient    Methods  Explanation;Demonstration;Tactile cues;Verbal cues;Handout    Comprehension  Returned demonstration;Verbalized understanding       OT Short Term Goals - 08/29/19 1911      OT SHORT TERM GOAL #1   Title  Pt R UE circumference decrease by 2 cm upper arm  and elbow, 1 cm at wrist to elbow  to be fitted wtih correct compressoin garments    Baseline  Pt R UE circumference increase by 2.8 cm upper arm, 3 cm elbow, forear, 2.5 , wrist 1.2 and hand 1 cm    Time  3    Period  Weeks    Status  New    Target Date  09/19/19        OT Long Term Goals - 08/29/19 1915      OT LONG TERM GOAL #1   Title  Pt and family to be independent in self MLD, soft tissue mobs , exercises for R shoulder and self bandaging    Baseline  no knowledge of HEP    Time  4    Period  Weeks    Status  New    Target Date  09/26/19      OT LONG TERM GOAL #2   Title  Pt to be independing in wearing correct compression garments for day and night time to keep R UE lymphedema under control    Baseline  never wore garments -    Time  4    Period  Weeks    Status  New    Target Date  09/26/19            Plan - 09/12/19 1535    Clinical Impression Statement  Pt showing some decongesting of R UE - less itching in arm - but bandages of posterior elbow loose- ed pt for husband to redo top bandage if it comes loose or slide down - ed on scar massage in R axilla and pect stretches / scapula squeezes added    OT Occupational Profile and History  Problem Focused Assessment - Including  review of records relating to presenting problem    Occupational performance deficits (Please refer to evaluation for details):  ADL's;IADL's;Work;Play;Leisure    Body Structure / Function / Physical Skills  ADL;UE functional use;ROM;Flexibility;Edema    Rehab Potential  Good    Clinical Decision Making  Limited treatment options, no task modification necessary    Comorbidities Affecting Occupational Performance:  None    Modification or Assistance to Complete Evaluation   No modification of tasks or assist necessary to complete eval    OT Frequency  3x / week    OT Duration  4 weeks    OT Treatment/Interventions  Self-care/ADL training;Manual lymph drainage;Patient/family education;Compression bandaging;Scar mobilization;Manual Therapy;Passive range of motion    Plan  circumference in R UE and check in itching better with change of stockinette  -and rebandage    OT Home Exercise Plan  see pt instruction    Consulted and Agree with Plan of Care  Patient       Patient will benefit from skilled therapeutic intervention in order to  improve the following deficits and impairments:   Body Structure / Function / Physical Skills: ADL, UE functional use, ROM, Flexibility, Edema       Visit Diagnosis: Stiffness of right shoulder, not elsewhere classified  Postmastectomy lymphedema syndrome    Problem List Patient Active Problem List   Diagnosis Date Noted  . Hypersensitivity reaction 03/16/2016  . Other iron deficiency anemia 02/16/2016  . Genetic testing 12/22/2015  . Family history of cancer 12/22/2015  . Family history of breast cancer in mother 12/11/2015  . Breast cancer of upper-outer quadrant of right female breast ([place]) 12/02/2015    Rosalyn Gess OTR/L,CLT 09/12/2019, 4:52 PM  [facility] [address], [place], [address] Phone: [phone]   Fax:  [phone]  Name: Kiara Spencer MRN:  [id number] Date of Birth: 03/03/1971

## 2019-09-12 NOTE — Patient Instructions (Signed)
Husband can redo as needed the top bandage -  And scar massage  pect stretch in corner add to HEP  And scapula squeezes  10 reps each

## 2019-09-14 ENCOUNTER — Ambulatory Visit: Payer: BC Managed Care – PPO | Admitting: Occupational Therapy

## 2019-09-14 ENCOUNTER — Telehealth: Payer: Self-pay | Admitting: *Deleted

## 2019-09-14 ENCOUNTER — Other Ambulatory Visit: Payer: Self-pay

## 2019-09-14 DIAGNOSIS — I972 Postmastectomy lymphedema syndrome: Secondary | ICD-10-CM

## 2019-09-14 DIAGNOSIS — M25611 Stiffness of right shoulder, not elsewhere classified: Secondary | ICD-10-CM

## 2019-09-14 NOTE — Therapy (Signed)
[facility] [address] [place], [address] Phone: [phone]   Fax:  [phone]  Occupational Therapy Treatment  Patient Details  Name: Kiara Spencer MRN: [id number] Date of Birth: 09-20-1971 No data recorded  Encounter Date: 09/14/2019  OT End of Session - 09/14/19 0945    Visit Number  8    Number of Visits  12    Date for OT Re-Evaluation  09/26/19    OT Start Time  0830    OT Stop Time  0915    OT Time Calculation (min)  45 min    Activity Tolerance  Patient tolerated treatment well    Behavior During Therapy  [facility] for tasks assessed/performed       Past Medical History:  Diagnosis Date  . Breast cancer ([place])   . Breast cancer of upper-outer quadrant of right female breast ([facility]) 12/02/2015  . Headache    hx migraines    Past Surgical History:  Procedure Laterality Date  . BREAST RECONSTRUCTION WITH PLACEMENT OF TISSUE EXPANDER AND FLEX HD (ACELLULAR HYDRATED DERMIS) Bilateral 02/05/2016   Procedure: BILATERAL BREAST RECONSTRUCTION WITH PLACEMENT OF TISSUE EXPANDER AND FLEX HD (ACELLULAR HYDRATED DERMIS);  Surgeon: Loel Lofty Dillingham, DO;  Location: [facility];  Service: Plastics;  Laterality: Bilateral;  . CESAREAN SECTION  x 2  . DILATION AND CURETTAGE OF UTERUS    . MASTECTOMY W/ SENTINEL NODE BIOPSY Right 02/05/2016   Procedure: RIGHT NIPPLE SPARING MASTECTOMY WITH RIGHT AXILLARY SENTINEL LYMPH NODE BIOPSY;  Surgeon: [name], MD;  Location: [facility];  Service: General;  Laterality: Right;  . MASTECTOMY, PARTIAL Left 02/05/2016   Procedure: LEFT PROPHYLACTIC NIPPLE SPARING MASTECTOMY ;  Surgeon: [name], MD;  Location: [facility];  Service: General;  Laterality: Left;  . PERIPHERAL VASCULAR CATHETERIZATION Right 09/16/2016   Procedure: PORTA CATH REMOVAL;  Surgeon: Wallace Going, DO;  Location: [facility];  Service: Plastics;  Laterality: Right;  . PORTACATH PLACEMENT Right  03/02/2016   Procedure: INSERTION PORT-A-CATH WITH ULTRASOUND;  Surgeon: [name], MD;  Location: [facility];  Service: General;  Laterality: Right;  IJ  . REMOVAL OF BILATERAL TISSUE EXPANDERS WITH PLACEMENT OF BILATERAL BREAST IMPLANTS Bilateral 09/16/2016   Procedure: REMOVAL OF BILATERAL TISSUE EXPANDERS WITH PLACEMENT OF BILATERAL BREAST IMPLANTS;  Surgeon: Wallace Going, DO;  Location: [facility];  Service: Plastics;  Laterality: Bilateral;    There were no vitals filed for this visit.  Subjective Assessment - 09/14/19 0924    Subjective   Done okay- but my elbow itch and my husband did the top bandage but maybe done it to loose    Pertinent History  R breast CA in 2017 with mastectomy, and then recontruction - had chemo and radiation end Jan 2018 - pt report she had little bit of swelling 2018 - but did not need compression sleeve - and did not wear any sleeve since 2017    Patient Stated Goals  I want to know what I need to do to treat this , and take care of my arm so it can get smaller and keep it undercontrol    Currently in Pain?  No/denies          LYMPHEDEMA/ONCOLOGY QUESTIONNAIRE - 09/14/19 0838      Right Upper Extremity Lymphedema   15 cm Proximal to Olecranon Process  31.4 cm    10 cm Proximal to Olecranon Process  31.3 cm    Olecranon Process  25.4 cm    15 cm Proximal to Ulnar Styloid Process  24.5 cm    10 cm Proximal to Ulnar Styloid Process  21 cm    Just Proximal to Ulnar Styloid Process  14.8 cm    Across Hand at [employer]  17.8 cm    At [place] of 2nd Digit  6 cm    At [facility]  6.4 cm        Pt arrive with bandages on - husband redone the top bandages it come loose at elbow - but maybe did not do it tight enough- measurements taken - see flowsheet   Pt  Report some itching  anterior and posterior elbow   Pt ed last time on doing somemanual lymph drainage And pt to doonce each day to help decrease  swelling. This should take you about 4minutes depending on the size of your limb. but pt do not have to do the arm part - only upper arm while bandaged  For Right Arm:   Hug yourself at the base of your neck and do 8 small circles, and 2 fingers behind clavicle 8 x   Do 8 semicircles at left armpit and right groin  Pump across chest from right to left 8 times  Pump down the right side of trunk from armpit to groin 8 times  Pump up the outside of right upper arm 8 times, inside of upper arm to outside 8x, outside of upper arm again 8x   Pump across chest from R to L 8 times  Pump down the right side of trunk from armpit to groin 8 times  Pump top of forearm from wrist to elbow 8 times  Pump up the outside of right upper arm 8 times  Pump across chest from right to left 8 times  Pump down the right side of trunk from armpit to groin 8 times  Pump up the back of the forearm from wrist to elbow 8 times  Pump up the outside of right upper arm 8 times  Pump across chest from right to left 8 times  Pump down the right side of trunk from armpit to groin 8 times  Do 8 semicircles at left armpit and right groin 8 times  Repeat nr.1  SLOW and LIGHT with only your palm NOT FINGERTIPS If help at home can replace or do also massage over upper back from right to left , when doing chest from right to left.   Scar massage done this date in axilla - show pt on her video on phone - but did not record - she approve - able to see where scar is - where to do  scar mobs and pt to do every day 3-5 min - x 3 day   Cont at home pect stretches gentle in cornes - hold 5 sec 10 reps  And scapula squeezes  3 x day 10 reps    Bandages remove prior to taking measurements  And pt wash arm    Eucerin lotionappliedon R UE isotoner glove and stockinet- soft protouch- Artiflex 10 cm for hand toforearm,and 15 cm for hand toElbow Rosidal foam from wrist to  axilla Short stretch 6 cm wrist<>hand to forearm Short stretch 8 cm hand to elbow And short stretch 10 cm for mid forearm to upper arm Done tubigrip E as Topper to keep tape in tact and prevent sliding down of bandages  Review precautions for bandages  HEP for AROMreview  Fisting, Wrist and elbow flexion , ext, and horizontal ABD and ADD for R UE  10 reps 3 x day - or when bandages feels tight                  OT Education - 09/14/19 0925    Education Details  scar massage and husband can redo top bandage at upper arm    Person(s) Educated  Patient    Methods  Explanation;Demonstration;Tactile cues;Verbal cues;Handout    Comprehension  Returned demonstration;Verbalized understanding       OT Short Term Goals - 08/29/19 1911      OT SHORT TERM GOAL #1   Title  Pt R UE circumference decrease by 2 cm upper arm  and elbow, 1 cm at wrist to elbow  to be fitted wtih correct compressoin garments    Baseline  Pt R UE circumference increase by 2.8 cm upper arm, 3 cm elbow, forear, 2.5 , wrist 1.2 and hand 1 cm    Time  3    Period  Weeks    Status  New    Target Date  09/19/19        OT Long Term Goals - 08/29/19 1915      OT LONG TERM GOAL #1   Title  Pt and family to be independent in self MLD, soft tissue mobs , exercises for R shoulder and self bandaging    Baseline  no knowledge of HEP    Time  4    Period  Weeks    Status  New    Target Date  09/26/19      OT LONG TERM GOAL #2   Title  Pt to be independing in wearing correct compression garments for day and night time to keep R UE lymphedema under control    Baseline  never wore garments -    Time  4    Period  Weeks    Status  New    Target Date  09/26/19            Plan - 09/14/19 0945    Clinical Impression Statement  Pt R UE did not decongest as good as last time - had some itching at elbow and husband redone top bandages but maybe done it to loose- review again wtih pt how to  redo the top bandage if needed over weekend and scar mobs    OT Occupational Profile and History  Problem Focused Assessment - Including review of records relating to presenting problem    Occupational performance deficits (Please refer to evaluation for details):  ADL's;IADL's;Work;Play;Leisure    Body Structure / Function / Physical Skills  ADL;UE functional use;ROM;Flexibility;Edema    Rehab Potential  Good    Clinical Decision Making  Limited treatment options, no task modification necessary    Comorbidities Affecting Occupational Performance:  None    Modification or Assistance to Complete Evaluation   No modification of tasks or assist necessary to complete eval    OT Frequency  3x / week    OT Duration  4 weeks    OT Treatment/Interventions  Self-care/ADL training;Manual lymph drainage;Patient/family education;Compression bandaging;Scar mobilization;Manual Therapy;Passive range of motion    Plan  circumference in R UE and check in itching better with change of stockinette  -and rebandage    OT Home Exercise Plan  see pt instruction    Consulted and Agree with Plan of Care  Patient       Patient will benefit from skilled therapeutic intervention in order to improve the following deficits and impairments:   Body Structure / Function / Physical Skills: ADL, UE functional use, ROM, Flexibility, Edema       Visit Diagnosis: Stiffness of right shoulder, not elsewhere classified  Postmastectomy lymphedema syndrome    Problem List Patient Active Problem List   Diagnosis Date Noted  . Hypersensitivity reaction 03/16/2016  . Other iron deficiency anemia 02/16/2016  . Genetic testing 12/22/2015  . Family history of cancer 12/22/2015  . Family history of breast cancer in mother 12/11/2015  . Breast cancer of upper-outer quadrant of right female breast ([place]) 12/02/2015    Rosalyn Gess OTR/L,CLT 09/14/2019, 9:47 AM  [facility] [address], [place], [address] Phone: [phone]   Fax:  [phone]  Name: Kiara Spencer MRN: [id number] Date of Birth: 1971/02/19

## 2019-09-14 NOTE — Telephone Encounter (Signed)
Received call from pt stating her lymphedema physical therapist is requesting pt to have a new compression sleeve.  Pt wanting to know if her insurance will cover another sleeve and how much it will be.  RN instructed pt to call [facility] in [place] and request that information from them.  This is the facility where she has received sleeves in the past and they would be able to answer that question for her.  Pt verbalized understanding.

## 2019-09-17 ENCOUNTER — Other Ambulatory Visit: Payer: Self-pay

## 2019-09-17 ENCOUNTER — Encounter: Payer: BC Managed Care – PPO | Admitting: Occupational Therapy

## 2019-09-17 ENCOUNTER — Ambulatory Visit: Payer: BC Managed Care – PPO | Admitting: Occupational Therapy

## 2019-09-17 DIAGNOSIS — I972 Postmastectomy lymphedema syndrome: Secondary | ICD-10-CM

## 2019-09-17 DIAGNOSIS — M25611 Stiffness of right shoulder, not elsewhere classified: Secondary | ICD-10-CM

## 2019-09-17 NOTE — Therapy (Signed)
[facility] [address] [place], [address] Phone: [phone]   Fax:  [phone]  Occupational Therapy Treatment  Patient Details  Name: Kiara Spencer MRN: [id number] Date of Birth: 01/26/1971 No data recorded  Encounter Date: 09/17/2019  OT End of Session - 09/17/19 1625    Visit Number  9    Number of Visits  12    Date for OT Re-Evaluation  09/26/19    OT Start Time  [id number]    OT Stop Time  1515    OT Time Calculation (min)  28 min    Activity Tolerance  Patient tolerated treatment well    Behavior During Therapy  [facility] for tasks assessed/performed       Past Medical History:  Diagnosis Date  . Breast cancer ([place])   . Breast cancer of upper-outer quadrant of right female breast ([place]) 12/02/2015  . Headache    hx migraines    Past Surgical History:  Procedure Laterality Date  . BREAST RECONSTRUCTION WITH PLACEMENT OF TISSUE EXPANDER AND FLEX HD (ACELLULAR HYDRATED DERMIS) Bilateral 02/05/2016   Procedure: BILATERAL BREAST RECONSTRUCTION WITH PLACEMENT OF TISSUE EXPANDER AND FLEX HD (ACELLULAR HYDRATED DERMIS);  Surgeon: Loel Lofty Dillingham, DO;  Location: [facility];  Service: Plastics;  Laterality: Bilateral;  . CESAREAN SECTION  x 2  . DILATION AND CURETTAGE OF UTERUS    . MASTECTOMY W/ SENTINEL NODE BIOPSY Right 02/05/2016   Procedure: RIGHT NIPPLE SPARING MASTECTOMY WITH RIGHT AXILLARY SENTINEL LYMPH NODE BIOPSY;  Surgeon: [name], MD;  Location: [facility];  Service: General;  Laterality: Right;  . MASTECTOMY, PARTIAL Left 02/05/2016   Procedure: LEFT PROPHYLACTIC NIPPLE SPARING MASTECTOMY ;  Surgeon: [name], MD;  Location: [facility];  Service: General;  Laterality: Left;  . PERIPHERAL VASCULAR CATHETERIZATION Right 09/16/2016   Procedure: PORTA CATH REMOVAL;  Surgeon: Wallace Going, DO;  Location: [facility];  Service: Plastics;  Laterality: Right;  . PORTACATH PLACEMENT Right  03/02/2016   Procedure: INSERTION PORT-A-CATH WITH ULTRASOUND;  Surgeon: [name], MD;  Location: [facility];  Service: General;  Laterality: Right;  IJ  . REMOVAL OF BILATERAL TISSUE EXPANDERS WITH PLACEMENT OF BILATERAL BREAST IMPLANTS Bilateral 09/16/2016   Procedure: REMOVAL OF BILATERAL TISSUE EXPANDERS WITH PLACEMENT OF BILATERAL BREAST IMPLANTS;  Surgeon: Wallace Going, DO;  Location: [facility];  Service: Plastics;  Laterality: Bilateral;    There were no vitals filed for this visit.  Subjective Assessment - 09/17/19 1624    Subjective   My husband had to redo the top white and tan bandages over weekend - my elbow pop out and upper arm slide down    Pertinent History  R breast CA in 2017 with mastectomy, and then recontruction - had chemo and radiation end Jan 2018 - pt report she had little bit of swelling 2018 - but did not need compression sleeve - and did not wear any sleeve since 2017    Patient Stated Goals  I want to know what I need to do to treat this , and take care of my arm so it can get smaller and keep it undercontrol    Currently in Pain?  No/denies          LYMPHEDEMA/ONCOLOGY QUESTIONNAIRE - 09/17/19 1447      Right Upper Extremity Lymphedema   15 cm Proximal to Olecranon Process  32 cm    10 cm Proximal  to Olecranon Process  31.8 cm    Olecranon Process  24.8 cm    15 cm Proximal to Ulnar Styloid Process  24.7 cm    10 cm Proximal to Ulnar Styloid Process  21.8 cm    Just Proximal to Ulnar Styloid Process  15 cm    Across Hand at [employer]  18 cm    At [place] of 2nd Digit  6 cm    At [facility] of Thumb  6.5 cm        Pt arrive with bandages on - husband redone the top bandages it come loose at elbow - but maybe did not do it tight enough- measurements taken - see flowsheet  PtReport some itching anterior and posterior elbow    And pt to doonce each day to help decrease swelling. This should take you  about 1minutes depending on the size of your limb. but pt do not have to do the arm part - only upper arm while bandaged  For Right Arm:   Hug yourself at the base of your neck and do 8 small circles, and 2 fingers behind clavicle 8 x   Do 8 semicircles at left armpit and right groin  Pump across chest from right to left 8 times  Pump down the right side of trunk from armpit to groin 8 times  Pump up the outside of right upper arm 8 times, inside of upper arm to outside 8x, outside of upper arm again 8x   Pump across chest from R to L 8 times  Pump down the right side of trunk from armpit to groin 8 times  Pump top of forearm from wrist to elbow 8 times  Pump up the outside of right upper arm 8 times  Pump across chest from right to left 8 times  Pump down the right side of trunk from armpit to groin 8 times  Pump up the back of the forearm from wrist to elbow 8 times  Pump up the outside of right upper arm 8 times  Pump across chest from right to left 8 times  Pump down the right side of trunk from armpit to groin 8 times  Do 8 semicircles at left armpit and right groin 8 times  Repeat nr.1  SLOW and LIGHT with only your palm NOT FINGERTIPS If help at home can replace or do also massage over upper back from right to left , when doing chest from right to left.    Cont at home pect stretches gentle in cornes - hold 5 sec 10 reps  And scapula squeezes  3 x day 10 reps   Bandages remove prior to taking measurements  And pt wash arm    Eucerin lotionappliedon R UE isotoner glove and stockinet- soft protouch- Artiflex 10 cm for hand toforearm,and 15 cm for hand toElbow( did one piece today )  Rosidal foam from wrist to axilla Short stretch 6 cm wrist<>hand to forearm Short stretch 8 cm hand to elbow And short stretch 10 cm for mid forearm to upper arm Done tubigrip E as Topper to keep tape in tact and prevent sliding down of  bandages   Review precautions for bandages  HEP for AROMreview  Fisting, Wrist and elbow flexion , ext, and horizontal ABD and ADD for R UE  10 reps 3 x day - or when bandages feels tight  OT Education - 09/17/19 1625    Education Details  bandaging    Person(s) Educated  Patient    Methods  Explanation;Demonstration;Tactile cues;Verbal cues;Handout    Comprehension  Returned demonstration;Verbalized understanding       OT Short Term Goals - 08/29/19 1911      OT SHORT TERM GOAL #1   Title  Pt R UE circumference decrease by 2 cm upper arm  and elbow, 1 cm at wrist to elbow  to be fitted wtih correct compressoin garments    Baseline  Pt R UE circumference increase by 2.8 cm upper arm, 3 cm elbow, forear, 2.5 , wrist 1.2 and hand 1 cm    Time  3    Period  Weeks    Status  New    Target Date  09/19/19        OT Long Term Goals - 08/29/19 1915      OT LONG TERM GOAL #1   Title  Pt and family to be independent in self MLD, soft tissue mobs , exercises for R shoulder and self bandaging    Baseline  no knowledge of HEP    Time  4    Period  Weeks    Status  New    Target Date  09/26/19      OT LONG TERM GOAL #2   Title  Pt to be independing in wearing correct compression garments for day and night time to keep R UE lymphedema under control    Baseline  never wore garments -    Time  4    Period  Weeks    Status  New    Target Date  09/26/19            Plan - 09/17/19 1626    Clinical Impression Statement  Pt R UE did not decongest - but it was over weekend and bandages slip - husband had to redo top  - did change some of the wide Artiflex this date -and did more figure 8's at elbow - to see if can prevent popping out at elbow and less sliding at upper arm    OT Occupational Profile and History  Problem Focused Assessment - Including review of records relating to presenting problem    Occupational performance deficits (Please  refer to evaluation for details):  ADL's;IADL's;Work;Play;Leisure    Body Structure / Function / Physical Skills  ADL;UE functional use;ROM;Flexibility;Edema    Rehab Potential  Good    Clinical Decision Making  Limited treatment options, no task modification necessary    Comorbidities Affecting Occupational Performance:  None    Modification or Assistance to Complete Evaluation   No modification of tasks or assist necessary to complete eval    OT Frequency  3x / week    OT Duration  4 weeks    OT Treatment/Interventions  Self-care/ADL training;Manual lymph drainage;Patient/family education;Compression bandaging;Scar mobilization;Manual Therapy;Passive range of motion    Plan  circumference in R UE and check in itching better with change of stockinette  -and rebandage    OT Home Exercise Plan  see pt instruction    Consulted and Agree with Plan of Care  Patient       Patient will benefit from skilled therapeutic intervention in order to improve the following deficits and impairments:   Body Structure / Function / Physical Skills: ADL, UE functional use, ROM, Flexibility, Edema       Visit Diagnosis: Stiffness of right shoulder, not elsewhere  classified  Postmastectomy lymphedema syndrome    Problem List Patient Active Problem List   Diagnosis Date Noted  . Hypersensitivity reaction 03/16/2016  . Other iron deficiency anemia 02/16/2016  . Genetic testing 12/22/2015  . Family history of cancer 12/22/2015  . Family history of breast cancer in mother 12/11/2015  . Breast cancer of upper-outer quadrant of right female breast ([place]) 12/02/2015    Rosalyn Gess OTR/L,CLT 09/17/2019, 4:31 PM  [facility] [address], [place], [address] Phone: [phone]   Fax:  [phone]  Name: Kiara Spencer MRN: [id number] Date of Birth: 01-19-1971

## 2019-09-17 NOTE — Patient Instructions (Signed)
same

## 2019-09-19 ENCOUNTER — Encounter: Payer: BC Managed Care – PPO | Admitting: Occupational Therapy

## 2019-09-19 ENCOUNTER — Other Ambulatory Visit: Payer: Self-pay

## 2019-09-19 ENCOUNTER — Ambulatory Visit: Payer: BC Managed Care – PPO | Admitting: Occupational Therapy

## 2019-09-19 DIAGNOSIS — I972 Postmastectomy lymphedema syndrome: Secondary | ICD-10-CM

## 2019-09-19 DIAGNOSIS — M25611 Stiffness of right shoulder, not elsewhere classified: Secondary | ICD-10-CM

## 2019-09-19 NOTE — Patient Instructions (Signed)
Pt to get measure tomorrow for compression garments and husband to bandage  And will check on her Friday

## 2019-09-19 NOTE — Therapy (Signed)
[facility] [address] [place], [address] Phone: [phone]   Fax:  [phone]  Occupational Therapy Treatment  Patient Details  Name: Kiara Spencer MRN: [id number] Date of Birth: 19-Dec-1970 No data recorded  Encounter Date: 09/19/2019  OT End of Session - 09/19/19 1617    Visit Number  10    Number of Visits  12    Date for OT Re-Evaluation  09/26/19    OT Start Time  1355    OT Stop Time  1440    OT Time Calculation (min)  45 min    Activity Tolerance  Patient tolerated treatment well    Behavior During Therapy  [facility] for tasks assessed/performed       Past Medical History:  Diagnosis Date  . Breast cancer ([place])   . Breast cancer of upper-outer quadrant of right female breast ([place]) 12/02/2015  . Headache    hx migraines    Past Surgical History:  Procedure Laterality Date  . BREAST RECONSTRUCTION WITH PLACEMENT OF TISSUE EXPANDER AND FLEX HD (ACELLULAR HYDRATED DERMIS) Bilateral 02/05/2016   Procedure: BILATERAL BREAST RECONSTRUCTION WITH PLACEMENT OF TISSUE EXPANDER AND FLEX HD (ACELLULAR HYDRATED DERMIS);  Surgeon: Loel Lofty Dillingham, DO;  Location: [facility];  Service: Plastics;  Laterality: Bilateral;  . CESAREAN SECTION  x 2  . DILATION AND CURETTAGE OF UTERUS    . MASTECTOMY W/ SENTINEL NODE BIOPSY Right 02/05/2016   Procedure: RIGHT NIPPLE SPARING MASTECTOMY WITH RIGHT AXILLARY SENTINEL LYMPH NODE BIOPSY;  Surgeon: [name], MD;  Location: [facility];  Service: General;  Laterality: Right;  . MASTECTOMY, PARTIAL Left 02/05/2016   Procedure: LEFT PROPHYLACTIC NIPPLE SPARING MASTECTOMY ;  Surgeon: [name], MD;  Location: [facility];  Service: General;  Laterality: Left;  . PERIPHERAL VASCULAR CATHETERIZATION Right 09/16/2016   Procedure: PORTA CATH REMOVAL;  Surgeon: Wallace Going, DO;  Location: [facility];  Service: Plastics;  Laterality: Right;  . PORTACATH PLACEMENT  Right 03/02/2016   Procedure: INSERTION PORT-A-CATH WITH ULTRASOUND;  Surgeon: [name], MD;  Location: [facility];  Service: General;  Laterality: Right;  IJ  . REMOVAL OF BILATERAL TISSUE EXPANDERS WITH PLACEMENT OF BILATERAL BREAST IMPLANTS Bilateral 09/16/2016   Procedure: REMOVAL OF BILATERAL TISSUE EXPANDERS WITH PLACEMENT OF BILATERAL BREAST IMPLANTS;  Surgeon: Wallace Going, DO;  Location: [facility];  Service: Plastics;  Laterality: Bilateral;    There were no vitals filed for this visit.  Subjective Assessment - 09/19/19 1616    Subjective   My arm was little ache in the wrist and upper arm since Monday - that was the first - no itching    Pertinent History  R breast CA in 2017 with mastectomy, and then recontruction - had chemo and radiation end Jan 2018 - pt report she had little bit of swelling 2018 - but did not need compression sleeve - and did not wear any sleeve since 2017    Patient Stated Goals  I want to know what I need to do to treat this , and take care of my arm so it can get smaller and keep it undercontrol    Currently in Pain?  No/denies          LYMPHEDEMA/ONCOLOGY QUESTIONNAIRE - 09/19/19 1402      Right Upper Extremity Lymphedema   15 cm Proximal to Olecranon Process  31.4 cm    10 cm Proximal to Olecranon  Process  31 cm    Olecranon Process  24.8 cm    15 cm Proximal to Ulnar Styloid Process  24.4 cm    10 cm Proximal to Ulnar Styloid Process  21 cm    Just Proximal to Ulnar Styloid Process  15 cm    Across Hand at [employer]  18.5 cm    At [place] of 2nd Digit  5.8 cm    At [facility] of Thumb  6.5 cm        Pt arrive with bandageson -remove bandages and pt wash arm    Pt can now do MLD to whole arm when husband changes bandages at home \ And pt to doonce each day to help decrease swelling. This should take you about 31minutes depending on the size of your limb. but pt do not have to do the arm part -  only upper arm while bandaged  For Right Arm:   Hug yourself at the base of your neck and do 8 small circles, and 2 fingers behind clavicle 8 x   Do 8 semicircles at left armpit and right groin  Pump across chest from right to left 8 times  Pump down the right side of trunk from armpit to groin 8 times  Pump up the outside of right upper arm 8 times, inside of upper arm to outside 8x, outside of upper arm again 8x   Pump across chest from R to L 8 times  Pump down the right side of trunk from armpit to groin 8 times  Pump top of forearm from wrist to elbow 8 times  Pump up the outside of right upper arm 8 times  Pump across chest from right to left 8 times  Pump down the right side of trunk from armpit to groin 8 times  Pump up the back of the forearm from wrist to elbow 8 times  Pump up the outside of right upper arm 8 times  Pump across chest from right to left 8 times  Pump down the right side of trunk from armpit to groin 8 times  Do 8 semicircles at left armpit and right groin 8 times  Repeat nr.1  SLOW and LIGHT with only your palm NOT FINGERTIPS If help at home can replace or do also massage over upper back from right to left , when doing chest from right to left.    Cont at homepect stretches gentle in cornes - hold 5 sec 10 reps  And scapula squeezes  3 x day 10 reps   Taken measurements  - appear pt R arm plateau and pt send to get measure for compression garments tomorrow and husband will rebandage afterwards  Pt video record bandaging for husband to look at     [employer] lotionappliedon R UE isotoner glove and stockinet- soft protouch- Artiflex 10 cm for hand toforearm,and 15 cm for hand toElbow( did one piece today )  Rosidal foam from wrist to axilla Short stretch 6 cm wrist<>hand to forearm Short stretch 8 cm hand to elbow And short stretch 10 cm for mid forearm to upper arm Done tubigrip E as Topper to keep tape  in tact and prevent sliding down of bandages   Review precautions for bandages  HEP for AROMreview  Fisting, Wrist and elbow flexion , ext, and horizontal ABD and ADD for R UE  10 reps 3 x day - or when bandages feels tight  OT Education - 09/19/19 1617    Education Details  getting measure for compression garments    Person(s) Educated  Patient    Methods  Explanation;Demonstration;Tactile cues;Verbal cues;Handout    Comprehension  Returned demonstration;Verbalized understanding       OT Short Term Goals - 08/29/19 1911      OT SHORT TERM GOAL #1   Title  Pt R UE circumference decrease by 2 cm upper arm  and elbow, 1 cm at wrist to elbow  to be fitted wtih correct compressoin garments    Baseline  Pt R UE circumference increase by 2.8 cm upper arm, 3 cm elbow, forear, 2.5 , wrist 1.2 and hand 1 cm    Time  3    Period  Weeks    Status  New    Target Date  09/19/19        OT Long Term Goals - 08/29/19 1915      OT LONG TERM GOAL #1   Title  Pt and family to be independent in self MLD, soft tissue mobs , exercises for R shoulder and self bandaging    Baseline  no knowledge of HEP    Time  4    Period  Weeks    Status  New    Target Date  09/26/19      OT LONG TERM GOAL #2   Title  Pt to be independing in wearing correct compression garments for day and night time to keep R UE lymphedema under control    Baseline  never wore garments -    Time  4    Period  Weeks    Status  New    Target Date  09/26/19            Plan - 09/19/19 1618    Clinical Impression Statement  Pt R UE circumference appear to be plateauing - pt still increase in elbow and upper arm by 2 cm - pt to get measure tomorrow for [employer] soft and glove custom sleeve and [place] night time garment - husband to bandage her afterwards and will check on her in 2 days    OT Occupational Profile and History  Problem Focused Assessment - Including review of  records relating to presenting problem    Occupational performance deficits (Please refer to evaluation for details):  ADL's;IADL's;Work;Play;Leisure    Body Structure / Function / Physical Skills  ADL;UE functional use;ROM;Flexibility;Edema    Rehab Potential  Good    Clinical Decision Making  Limited treatment options, no task modification necessary    Comorbidities Affecting Occupational Performance:  None    Modification or Assistance to Complete Evaluation   No modification of tasks or assist necessary to complete eval    OT Frequency  3x / week    OT Treatment/Interventions  Self-care/ADL training;Manual lymph drainage;Patient/family education;Compression bandaging;Scar mobilization;Manual Therapy;Passive range of motion    Plan  check on if got measure and assess how husband did with bandages    OT Home Exercise Plan  see pt instruction    Consulted and Agree with Plan of Care  Patient       Patient will benefit from skilled therapeutic intervention in order to improve the following deficits and impairments:   Body Structure / Function / Physical Skills: ADL, UE functional use, ROM, Flexibility, Edema       Visit Diagnosis: Stiffness of right shoulder, not elsewhere classified  Postmastectomy lymphedema syndrome    Problem List  Patient Active Problem List   Diagnosis Date Noted  . Hypersensitivity reaction 03/16/2016  . Other iron deficiency anemia 02/16/2016  . Genetic testing 12/22/2015  . Family history of cancer 12/22/2015  . Family history of breast cancer in mother 12/11/2015  . Breast cancer of upper-outer quadrant of right female breast ([place]) 12/02/2015    Rosalyn Gess OTR/L,CLT 09/19/2019, 4:22 PM  [facility] [address], [place], [address] Phone: [phone]   Fax:  [phone]  Name: Brendaly Donnel MRN: [id number] Date of Birth: 19-Jun-1971

## 2019-09-21 ENCOUNTER — Ambulatory Visit: Payer: BC Managed Care – PPO | Admitting: Occupational Therapy

## 2019-09-25 ENCOUNTER — Ambulatory Visit: Payer: BC Managed Care – PPO | Admitting: Occupational Therapy

## 2019-09-25 ENCOUNTER — Other Ambulatory Visit: Payer: Self-pay

## 2019-09-25 DIAGNOSIS — M25611 Stiffness of right shoulder, not elsewhere classified: Secondary | ICD-10-CM

## 2019-09-25 DIAGNOSIS — I972 Postmastectomy lymphedema syndrome: Secondary | ICD-10-CM

## 2019-09-25 NOTE — Therapy (Signed)
[facility] [address], [place], [address] Phone: [phone]   Fax:  [phone]  Occupational Therapy Treatment  Patient Details  Name: Kiara Spencer MRN: [id number] Date of Birth: 03-23-1971 No data recorded  Encounter Date: 09/25/2019  OT End of Session - 09/25/19 1634    Visit Number  11    Number of Visits  12    Date for OT Re-Evaluation  09/26/19    OT Start Time  [id number]    OT Stop Time  1628    OT Time Calculation (min)  50 min    Activity Tolerance  Patient tolerated treatment well    Behavior During Therapy  [facility] for tasks assessed/performed       Past Medical History:  Diagnosis Date  . Breast cancer ([place])   . Breast cancer of upper-outer quadrant of right female breast ([place]) 12/02/2015  . Headache    hx migraines    Past Surgical History:  Procedure Laterality Date  . BREAST RECONSTRUCTION WITH PLACEMENT OF TISSUE EXPANDER AND FLEX HD (ACELLULAR HYDRATED DERMIS) Bilateral 02/05/2016   Procedure: BILATERAL BREAST RECONSTRUCTION WITH PLACEMENT OF TISSUE EXPANDER AND FLEX HD (ACELLULAR HYDRATED DERMIS);  Surgeon: Loel Lofty Dillingham, DO;  Location: [place];  Service: Plastics;  Laterality: Bilateral;  . CESAREAN SECTION  x 2  . DILATION AND CURETTAGE OF UTERUS    . MASTECTOMY W/ SENTINEL NODE BIOPSY Right 02/05/2016   Procedure: RIGHT NIPPLE SPARING MASTECTOMY WITH RIGHT AXILLARY SENTINEL LYMPH NODE BIOPSY;  Surgeon: [name], MD;  Location: [place];  Service: General;  Laterality: Right;  . MASTECTOMY, PARTIAL Left 02/05/2016   Procedure: LEFT PROPHYLACTIC NIPPLE SPARING MASTECTOMY ;  Surgeon: [name], MD;  Location: [place];  Service: General;  Laterality: Left;  . PERIPHERAL VASCULAR CATHETERIZATION Right 09/16/2016   Procedure: PORTA CATH REMOVAL;  Surgeon: Wallace Going, DO;  Location: [place];  Service: Plastics;  Laterality: Right;  . PORTACATH PLACEMENT  Right 03/02/2016   Procedure: INSERTION PORT-A-CATH WITH ULTRASOUND;  Surgeon: [name], MD;  Location: [place];  Service: General;  Laterality: Right;  IJ  . REMOVAL OF BILATERAL TISSUE EXPANDERS WITH PLACEMENT OF BILATERAL BREAST IMPLANTS Bilateral 09/16/2016   Procedure: REMOVAL OF BILATERAL TISSUE EXPANDERS WITH PLACEMENT OF BILATERAL BREAST IMPLANTS;  Surgeon: Wallace Going, DO;  Location: [place];  Service: Plastics;  Laterality: Bilateral;    There were no vitals filed for this visit.  Subjective Assessment - 09/25/19 1633    Subjective   My husband change my bandages every other day - I am just discourage that my elbow and upper arm is still larger    Pertinent History  R breast CA in 2017 with mastectomy, and then recontruction - had chemo and radiation end Jan 2018 - pt report she had little bit of swelling 2018 - but did not need compression sleeve - and did not wear any sleeve since 2017    Patient Stated Goals  I want to know what I need to do to treat this , and take care of my arm so it can get smaller and keep it undercontrol    Currently in Pain?  No/denies          LYMPHEDEMA/ONCOLOGY QUESTIONNAIRE - 09/25/19 1543      Right Upper Extremity Lymphedema   15 cm Proximal to Olecranon Process  32 cm    10 cm Proximal to  Olecranon Process  31.5 cm    Olecranon Process  25.2 cm    15 cm Proximal to Ulnar Styloid Process  24.8 cm    10 cm Proximal to Ulnar Styloid Process  21 cm    Just Proximal to Ulnar Styloid Process  15 cm    Across Hand at [employer]  17.8 cm    At [place] of 2nd Digit  6 cm    At [facility] of Thumb  6 cm         Pt arrive with bandageson -remove bandages and pt wash arm    Pt report she is doing self MLD at home - but elbow and upper arm still stay increase more than 2 cm  OT done this date MLD   For Right Arm:   Hug yourself at the base of your neck and do 8 small circles, and 2 fingers  behind clavicle 8 x   Do 8 semicircles at left armpit and right groin  Pump across chest from right to left 8 times  Pump down the right side of trunk from armpit to groin 8 times  Pump up the outside of right upper arm 8 times, inside of upper arm to outside 8x, outside of upper arm again 8x   Pump across chest from R to L 8 times  Pump down the right side of trunk from armpit to groin 8 times  Pump top of forearm from wrist to elbow 8 times  Pump up the outside of right upper arm 8 times  Pump across chest from right to left 8 times  Pump down the right side of trunk from armpit to groin 8 times  Pump up the back of the forearm from wrist to elbow 8 times  Pump up the outside of right upper arm 8 times  Pump across chest from right to left 8 times  Pump down the right side of trunk from armpit to groin 8 times  Do 8 semicircles at left armpit and right groin 8 times  Repeat nr.1  SLOW and LIGHT with only your palm NOT FINGERTIPS If help at home can replace or do also massage over upper back from right to left , when doing chest from right to left.   Pt show increase shoulder flexion and ABD -and less pulling in the axilla area Cont at homepect stretches gentle in cornes - hold 5 sec 10 reps  And scapula squeezes  3 x day 10 reps    Eucerin lotionappliedon R UE isotoner glove and stockinet- soft protouch- New Artiflex 10 cm for hand toforearm,and 15 cm for hand toElbow( did one piece today ) Rosidal foam from wrist to axilla ( new one) Replace all short stretch with new ones  Short stretch 6 cm wrist<>hand to forearm Short stretch 8 cm hand to elbow And short stretch 10 cm for mid forearm to upper arm    Review precautions for bandages  HEP for AROMreview  Fisting, Wrist and elbow flexion , ext, and horizontal ABD and ADD for R UE  10 reps 3 x day - or when bandages feels  tight                             OT Education - 09/25/19 1634    Education Details  bandaging , POC for compression and pump    Person(s) Educated  Patient    Methods  Explanation;Demonstration;Tactile  cues;Verbal cues;Handout    Comprehension  Returned demonstration;Verbalized understanding       OT Short Term Goals - 08/29/19 1911      OT SHORT TERM GOAL #1   Title  Pt R UE circumference decrease by 2 cm upper arm  and elbow, 1 cm at wrist to elbow  to be fitted wtih correct compressoin garments    Baseline  Pt R UE circumference increase by 2.8 cm upper arm, 3 cm elbow, forear, 2.5 , wrist 1.2 and hand 1 cm    Time  3    Period  Weeks    Status  New    Target Date  09/19/19        OT Long Term Goals - 08/29/19 1915      OT LONG TERM GOAL #1   Title  Pt and family to be independent in self MLD, soft tissue mobs , exercises for R shoulder and self bandaging    Baseline  no knowledge of HEP    Time  4    Period  Weeks    Status  New    Target Date  09/26/19      OT LONG TERM GOAL #2   Title  Pt to be independing in wearing correct compression garments for day and night time to keep R UE lymphedema under control    Baseline  never wore garments -    Time  4    Period  Weeks    Status  New    Target Date  09/26/19            Plan - 09/25/19 1634    Clinical Impression Statement  Pt's husband had been bandaging her R UE for the last 6 days - circumference did increase -OT done some MLD this date and bandage her - waiting for her compression garments to come in -and would recommend at this stage for a pump demo to assess if that can decrease her elbow and upper arm that is stil increase by morethan 2 cm - 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 , full time educator    OT Occupational Profile and History  Problem Focused Assessment - Including review of records relating to presenting problem    Occupational performance deficits (Please refer to  evaluation for details):  ADL's;IADL's;Work;Play;Leisure    Body Structure / Function / Physical Skills  ADL;UE functional use;ROM;Flexibility;Edema    Rehab Potential  Good    Clinical Decision Making  Limited treatment options, no task modification necessary    Modification or Assistance to Complete Evaluation   No modification of tasks or assist necessary to complete eval    OT Frequency  1x / week    OT Duration  4 weeks    OT Treatment/Interventions  Self-care/ADL training;Manual lymph drainage;Patient/family education;Compression bandaging;Scar mobilization;Manual Therapy;Passive range of motion    Plan  check on if got measure and assess how husband did with bandages    OT Home Exercise Plan  see pt instruction    Consulted and Agree with Plan of Care  Patient       Patient will benefit from skilled therapeutic intervention in order to improve the following deficits and impairments:   Body Structure / Function / Physical Skills: ADL, UE functional use, ROM, Flexibility, Edema       Visit Diagnosis: Stiffness of right shoulder, not elsewhere classified  Postmastectomy lymphedema syndrome    Problem List Patient Active Problem List   Diagnosis Date  Noted  . Hypersensitivity reaction 03/16/2016  . Other iron deficiency anemia 02/16/2016  . Genetic testing 12/22/2015  . Family history of cancer 12/22/2015  . Family history of breast cancer in mother 12/11/2015  . Breast cancer of upper-outer quadrant of right female breast ([place]) 12/02/2015    Rosalyn Gess OTR/L,CLT 09/25/2019, 4:37 PM  [facility] [address] [place], [address] Phone: [phone]   Fax:  [phone]  Name: Kiara Spencer MRN: [id number] Date of Birth: 09/06/1971

## 2019-09-25 NOTE — Patient Instructions (Signed)
Same

## 2019-09-28 ENCOUNTER — Encounter

## 2019-09-28 ENCOUNTER — Ambulatory Visit: Payer: BC Managed Care – PPO | Admitting: Internal Medicine

## 2019-10-03 ENCOUNTER — Ambulatory Visit: Payer: BC Managed Care – PPO | Admitting: Occupational Therapy

## 2019-10-03 ENCOUNTER — Other Ambulatory Visit: Payer: Self-pay

## 2019-10-03 DIAGNOSIS — M25611 Stiffness of right shoulder, not elsewhere classified: Secondary | ICD-10-CM

## 2019-10-03 DIAGNOSIS — I972 Postmastectomy lymphedema syndrome: Secondary | ICD-10-CM

## 2019-10-03 NOTE — Therapy (Signed)
[facility] [address], [place], [address] Phone: [phone]   Fax:  [phone]  Occupational Therapy Treatment  Patient Details  Name: Kiara Spencer MRN: [id number] Date of Birth: June 04, 1971 No data recorded  Encounter Date: 10/03/2019  OT End of Session - 10/03/19 1715    Visit Number  12    Number of Visits  16    Date for OT Re-Evaluation  11/14/19    OT Start Time  1501    OT Stop Time  1540    OT Time Calculation (min)  39 min    Activity Tolerance  Patient tolerated treatment well    Behavior During Therapy  [facility] for tasks assessed/performed       Past Medical History:  Diagnosis Date  . Breast cancer ([place])   . Breast cancer of upper-outer quadrant of right female breast ([place]) 12/02/2015  . Headache    hx migraines    Past Surgical History:  Procedure Laterality Date  . BREAST RECONSTRUCTION WITH PLACEMENT OF TISSUE EXPANDER AND FLEX HD (ACELLULAR HYDRATED DERMIS) Bilateral 02/05/2016   Procedure: BILATERAL BREAST RECONSTRUCTION WITH PLACEMENT OF TISSUE EXPANDER AND FLEX HD (ACELLULAR HYDRATED DERMIS);  Surgeon: Loel Lofty Dillingham, DO;  Location: [place];  Service: Plastics;  Laterality: Bilateral;  . CESAREAN SECTION  x 2  . DILATION AND CURETTAGE OF UTERUS    . MASTECTOMY W/ SENTINEL NODE BIOPSY Right 02/05/2016   Procedure: RIGHT NIPPLE SPARING MASTECTOMY WITH RIGHT AXILLARY SENTINEL LYMPH NODE BIOPSY;  Surgeon: [name], MD;  Location: [place];  Service: General;  Laterality: Right;  . MASTECTOMY, PARTIAL Left 02/05/2016   Procedure: LEFT PROPHYLACTIC NIPPLE SPARING MASTECTOMY ;  Surgeon: [name], MD;  Location: [place];  Service: General;  Laterality: Left;  . PERIPHERAL VASCULAR CATHETERIZATION Right 09/16/2016   Procedure: PORTA CATH REMOVAL;  Surgeon: Wallace Going, DO;  Location: [place];  Service: Plastics;  Laterality: Right;  . PORTACATH PLACEMENT  Right 03/02/2016   Procedure: INSERTION PORT-A-CATH WITH ULTRASOUND;  Surgeon: [name], MD;  Location: [place];  Service: General;  Laterality: Right;  IJ  . REMOVAL OF BILATERAL TISSUE EXPANDERS WITH PLACEMENT OF BILATERAL BREAST IMPLANTS Bilateral 09/16/2016   Procedure: REMOVAL OF BILATERAL TISSUE EXPANDERS WITH PLACEMENT OF BILATERAL BREAST IMPLANTS;  Surgeon: Wallace Going, DO;  Location: [place];  Service: Plastics;  Laterality: Bilateral;    There were no vitals filed for this visit.  Subjective Assessment - 10/03/19 1647    Subjective   I did bandage the last 2 nights - do you know when my night time garment come in- and then my glove do not match my sleeve - I ordered honey color - Matt coming this afternoon to show the pump    Pertinent History  R breast CA in 2017 with mastectomy, and then recontruction - had chemo and radiation end Jan 2018 - pt report she had little bit of swelling 2018 - but did not need compression sleeve - and did not wear any sleeve since 2017    Patient Stated Goals  I want to know what I need to do to treat this , and take care of my arm so it can get smaller and keep it undercontrol    Currently in Pain?  No/denies          LYMPHEDEMA/ONCOLOGY QUESTIONNAIRE - 10/03/19 1504      Right Upper Extremity  Lymphedema   15 cm Proximal to Olecranon Process  32.5 cm    10 cm Proximal to Olecranon Process  32 cm    Olecranon Process  25.5 cm    15 cm Proximal to Ulnar Styloid Process  25 cm    10 cm Proximal to Ulnar Styloid Process  21.4 cm    Just Proximal to Ulnar Styloid Process  15 cm    Across Hand at [employer]  18.8 cm    At [place] of 2nd Digit  6 cm    At [facility] of Thumb  6 cm       measurements taken -see flowsheet - did increase since last time  She did get her daysleeve but not night time - did not know she had to bandage at night time  Did bandage the last 2 nights  Daytime sleeve  And glove  fits well - but glove is the wrong color  Did contact DME company - will look into the glove and night time sleeve will be in next week   Pump Rep was in this date to do trial with her - to assist with MLD -forearm and upper arm did not want to decrease compare to L side  Await Reps report  Pt to call when night sleeve comes in   Ed pt on wearing daytime sleeve and glove correctly                 OT Education - 10/03/19 1715    Education Details  wearing of garments and POC    Person(s) Educated  Patient    Methods  Explanation;Demonstration;Tactile cues;Verbal cues;Handout    Comprehension  Returned demonstration;Verbalized understanding       OT Short Term Goals - 10/03/19 1718      OT SHORT TERM GOAL #1   Title  Pt R UE circumference decrease by 2 cm upper arm  and elbow, 1 cm at wrist to elbow  to be fitted wtih correct compressoin garments    Baseline  await night time garment stil    Time  2    Period  Weeks    Status  On-going    Target Date  10/17/19        OT Long Term Goals - 10/03/19 1718      OT LONG TERM GOAL #1   Title  Pt and family to be independent in self MLD, soft tissue mobs , exercises for R shoulder and self bandaging    Status  Achieved      OT LONG TERM GOAL #2   Title  Pt to be independing in wearing correct compression garments for day and night time to keep R UE lymphedema under control    Baseline  daytime sleeve fits well - but glove color wrong -and night time should be in next week per DME company -and pump had trial today    Time  6    Period  Weeks    Status  On-going    Target Date  11/14/19            Plan - 10/03/19 1716    Clinical Impression Statement  Pt recieved her [employer] soft sleeve and glove but glove do not fit the sleeves color -and night time garment not in yet - husband bandage her arm the last 2 nights - she is getting trial visit ot pump this afternoon to assist with MLD - pt to call when  night  time garment come in - and cont bandage at night time    OT Occupational Profile and History  Problem Focused Assessment - Including review of records relating to presenting problem    Occupational performance deficits (Please refer to evaluation for details):  ADL's;IADL's;Work;Play;Leisure    Body Structure / Function / Physical Skills  ADL;UE functional use;ROM;Flexibility;Edema    Rehab Potential  Good    Clinical Decision Making  Limited treatment options, no task modification necessary    Comorbidities Affecting Occupational Performance:  None    Modification or Assistance to Complete Evaluation   No modification of tasks or assist necessary to complete eval    OT Frequency  1x / week   1 x wk to biweekly   OT Duration  6 weeks    OT Treatment/Interventions  Self-care/ADL training;Manual lymph drainage;Patient/family education;Compression bandaging;Scar mobilization;Manual Therapy;Passive range of motion    Plan  assess fit  with night time and circumference with garments    OT Home Exercise Plan  see pt instruction    Consulted and Agree with Plan of Care  Patient       Patient will benefit from skilled therapeutic intervention in order to improve the following deficits and impairments:   Body Structure / Function / Physical Skills: ADL, UE functional use, ROM, Flexibility, Edema       Visit Diagnosis: Postmastectomy lymphedema syndrome - Plan: Ot plan of care cert/re-cert  Stiffness of right shoulder, not elsewhere classified - Plan: Ot plan of care cert/re-cert    Problem List Patient Active Problem List   Diagnosis Date Noted  . Hypersensitivity reaction 03/16/2016  . Other iron deficiency anemia 02/16/2016  . Genetic testing 12/22/2015  . Family history of cancer 12/22/2015  . Family history of breast cancer in mother 12/11/2015  . Breast cancer of upper-outer quadrant of right female breast ([place]) 12/02/2015    Ehsan Corvin OTR/L, CLT  10/03/2019, 5:22  PM  [facility] [address], [place], [address] Phone: [phone]   Fax:  [phone]  Name: Kiara Spencer MRN: [id number] Date of Birth: 05/18/1971

## 2019-10-03 NOTE — Patient Instructions (Signed)
Bandage at night time until night time garment come in  And cont with daytime

## 2019-10-15 ENCOUNTER — Ambulatory Visit: Payer: BC Managed Care – PPO | Attending: Hematology and Oncology | Admitting: Occupational Therapy

## 2019-10-15 ENCOUNTER — Other Ambulatory Visit: Payer: Self-pay

## 2019-10-15 DIAGNOSIS — M25611 Stiffness of right shoulder, not elsewhere classified: Secondary | ICD-10-CM

## 2019-10-15 DIAGNOSIS — I972 Postmastectomy lymphedema syndrome: Secondary | ICD-10-CM | POA: Insufficient documentation

## 2019-10-15 NOTE — Therapy (Signed)
[facility] [address] [place], [address] Phone: [phone]   Fax:  [phone]  Occupational Therapy Treatment  Patient Details  Name: Kiara Spencer MRN: [id number] Date of Birth: 09-23-1971 No data recorded  Encounter Date: 10/15/2019  OT End of Session - 10/15/19 1515    Visit Number  13    Number of Visits  16    Date for OT Re-Evaluation  11/14/19    OT Start Time  1435    OT Stop Time  1513    OT Time Calculation (min)  38 min    Activity Tolerance  Patient tolerated treatment well    Behavior During Therapy  [facility] for tasks assessed/performed       Past Medical History:  Diagnosis Date  . Breast cancer ([place])   . Breast cancer of upper-outer quadrant of right female breast ([place]) 12/02/2015  . Headache    hx migraines    Past Surgical History:  Procedure Laterality Date  . BREAST RECONSTRUCTION WITH PLACEMENT OF TISSUE EXPANDER AND FLEX HD (ACELLULAR HYDRATED DERMIS) Bilateral 02/05/2016   Procedure: BILATERAL BREAST RECONSTRUCTION WITH PLACEMENT OF TISSUE EXPANDER AND FLEX HD (ACELLULAR HYDRATED DERMIS);  Surgeon: Loel Lofty Dillingham, DO;  Location: [place];  Service: Plastics;  Laterality: Bilateral;  . CESAREAN SECTION  x 2  . DILATION AND CURETTAGE OF UTERUS    . MASTECTOMY W/ SENTINEL NODE BIOPSY Right 02/05/2016   Procedure: RIGHT NIPPLE SPARING MASTECTOMY WITH RIGHT AXILLARY SENTINEL LYMPH NODE BIOPSY;  Surgeon: [name], MD;  Location: [place];  Service: General;  Laterality: Right;  . MASTECTOMY, PARTIAL Left 02/05/2016   Procedure: LEFT PROPHYLACTIC NIPPLE SPARING MASTECTOMY ;  Surgeon: [name], MD;  Location: [place];  Service: General;  Laterality: Left;  . PERIPHERAL VASCULAR CATHETERIZATION Right 09/16/2016   Procedure: PORTA CATH REMOVAL;  Surgeon: Wallace Going, DO;  Location: [place];  Service: Plastics;  Laterality: Right;  . PORTACATH PLACEMENT Right  03/02/2016   Procedure: INSERTION PORT-A-CATH WITH ULTRASOUND;  Surgeon: [name], MD;  Location: [place];  Service: General;  Laterality: Right;  IJ  . REMOVAL OF BILATERAL TISSUE EXPANDERS WITH PLACEMENT OF BILATERAL BREAST IMPLANTS Bilateral 09/16/2016   Procedure: REMOVAL OF BILATERAL TISSUE EXPANDERS WITH PLACEMENT OF BILATERAL BREAST IMPLANTS;  Surgeon: Wallace Going, DO;  Location: [place];  Service: Plastics;  Laterality: Bilateral;    There were no vitals filed for this visit.  Subjective Assessment - 10/15/19 1514    Subjective   I love the pump - it feels so good - softer afterwards  -I did bring my night time for you to show me to wear correctly    Pertinent History  R breast CA in 2017 with mastectomy, and then recontruction - had chemo and radiation end Jan 2018 - pt report she had little bit of swelling 2018 - but did not need compression sleeve - and did not wear any sleeve since 2017    Patient Stated Goals  I want to know what I need to do to treat this , and take care of my arm so it can get smaller and keep it undercontrol    Currently in Pain?  No/denies       Pt arrive with her Jobst Elvarex soft sleeve on - took her glove off - do sometimes go without it  Received her night time Jubilee sleeve and glove last  last week    LYMPHEDEMA/ONCOLOGY QUESTIONNAIRE - 10/15/19 1441      Right Upper Extremity Lymphedema   15 cm Proximal to Olecranon Process  31 cm    10 cm Proximal to Olecranon Process  31.5 cm    Olecranon Process  25.5 cm    15 cm Proximal to Ulnar Styloid Process  25 cm    10 cm Proximal to Ulnar Styloid Process  21 cm    Just Proximal to Ulnar Styloid Process  15 cm    Across Hand at [employer]  19 cm    At [place] of 2nd Digit  6 cm    At [facility] of Thumb  6.5 cm       review and ed pt on donning and wearing Jubilee correctly - provided her tabs to do for correct gradient compression  Pt demo  understanding -and also correct way of donning /doffing  Cont with daytime compression sleeve and  Glove   Review again pump use -  40 mmHg 2 x day  45 to 60 min    Per rep at 81mmHG - pt report it feels great -  Will reassess in week with changes made for night time garment               OT Education - 10/15/19 1515    Education Details  ed on wearing compression garments correctly    Person(s) Educated  Patient    Methods  Explanation;Demonstration;Tactile cues;Verbal cues;Handout    Comprehension  Returned demonstration;Verbalized understanding       OT Short Term Goals - 10/03/19 1718      OT SHORT TERM GOAL #1   Title  Pt R UE circumference decrease by 2 cm upper arm  and elbow, 1 cm at wrist to elbow  to be fitted wtih correct compressoin garments    Baseline  await night time garment stil    Time  2    Period  Weeks    Status  On-going    Target Date  10/17/19        OT Long Term Goals - 10/03/19 1718      OT LONG TERM GOAL #1   Title  Pt and family to be independent in self MLD, soft tissue mobs , exercises for R shoulder and self bandaging    Status  Achieved      OT LONG TERM GOAL #2   Title  Pt to be independing in wearing correct compression garments for day and night time to keep R UE lymphedema under control    Baseline  daytime sleeve fits well - but glove color wrong -and night time should be in next week per DME company -and pump had trial today    Time  6    Period  Weeks    Status  On-going    Target Date  11/14/19            Plan - 10/15/19 1516    Clinical Impression Statement  Pt did recieve last week her night time Jubilee night time garments - pt 's circumference still increase at distal upper arm , elbow and proximal forearm - adjusted her night time sleeve compression - provide her tabs to make sure she has gradient - pt to come back for assessment next week    OT Occupational Profile and History  Problem Focused Assessment -  Including review of records relating to presenting problem    Occupational  performance deficits (Please refer to evaluation for details):  ADL's;IADL's;Work;Play;Leisure    Body Structure / Function / Physical Skills  ADL;UE functional use;ROM;Flexibility;Edema    Rehab Potential  Good    Clinical Decision Making  Limited treatment options, no task modification necessary    Comorbidities Affecting Occupational Performance:  None    Modification or Assistance to Complete Evaluation   No modification of tasks or assist necessary to complete eval    OT Frequency  1x / week    OT Duration  6 weeks    OT Treatment/Interventions  Self-care/ADL training;Manual lymph drainage;Patient/family education;Compression bandaging;Scar mobilization;Manual Therapy;Passive range of motion    Plan  assess circimference with homeprogram    OT Home Exercise Plan  see pt instruction    Consulted and Agree with Plan of Care  Patient       Patient will benefit from skilled therapeutic intervention in order to improve the following deficits and impairments:   Body Structure / Function / Physical Skills: ADL, UE functional use, ROM, Flexibility, Edema       Visit Diagnosis: Postmastectomy lymphedema syndrome  Stiffness of right shoulder, not elsewhere classified    Problem List Patient Active Problem List   Diagnosis Date Noted  . Hypersensitivity reaction 03/16/2016  . Other iron deficiency anemia 02/16/2016  . Genetic testing 12/22/2015  . Family history of cancer 12/22/2015  . Family history of breast cancer in mother 12/11/2015  . Breast cancer of upper-outer quadrant of right female breast ([place]) 12/02/2015    Rosalyn Gess OTR/L,CLT 10/15/2019, 3:19 PM  [place] [facility] [address] [place], [address] Phone: [phone]   Fax:  [phone]  Name: Amybeth Suttner MRN: [id number] Date of Birth: 07/09/1971

## 2019-10-20 IMAGING — DX DG SHOULDER 2+V*R*
3 series · 3 of 3 positions shown · non-contrast
Comparison: None.

CLINICAL DATA: Intermittent RIGHT arm pain for 3 months. History of
breast cancer.

EXAM:
RIGHT HUMERUS - 2+ VIEW; RIGHT SHOULDER - 2+ VIEW

[shoulder grashey]
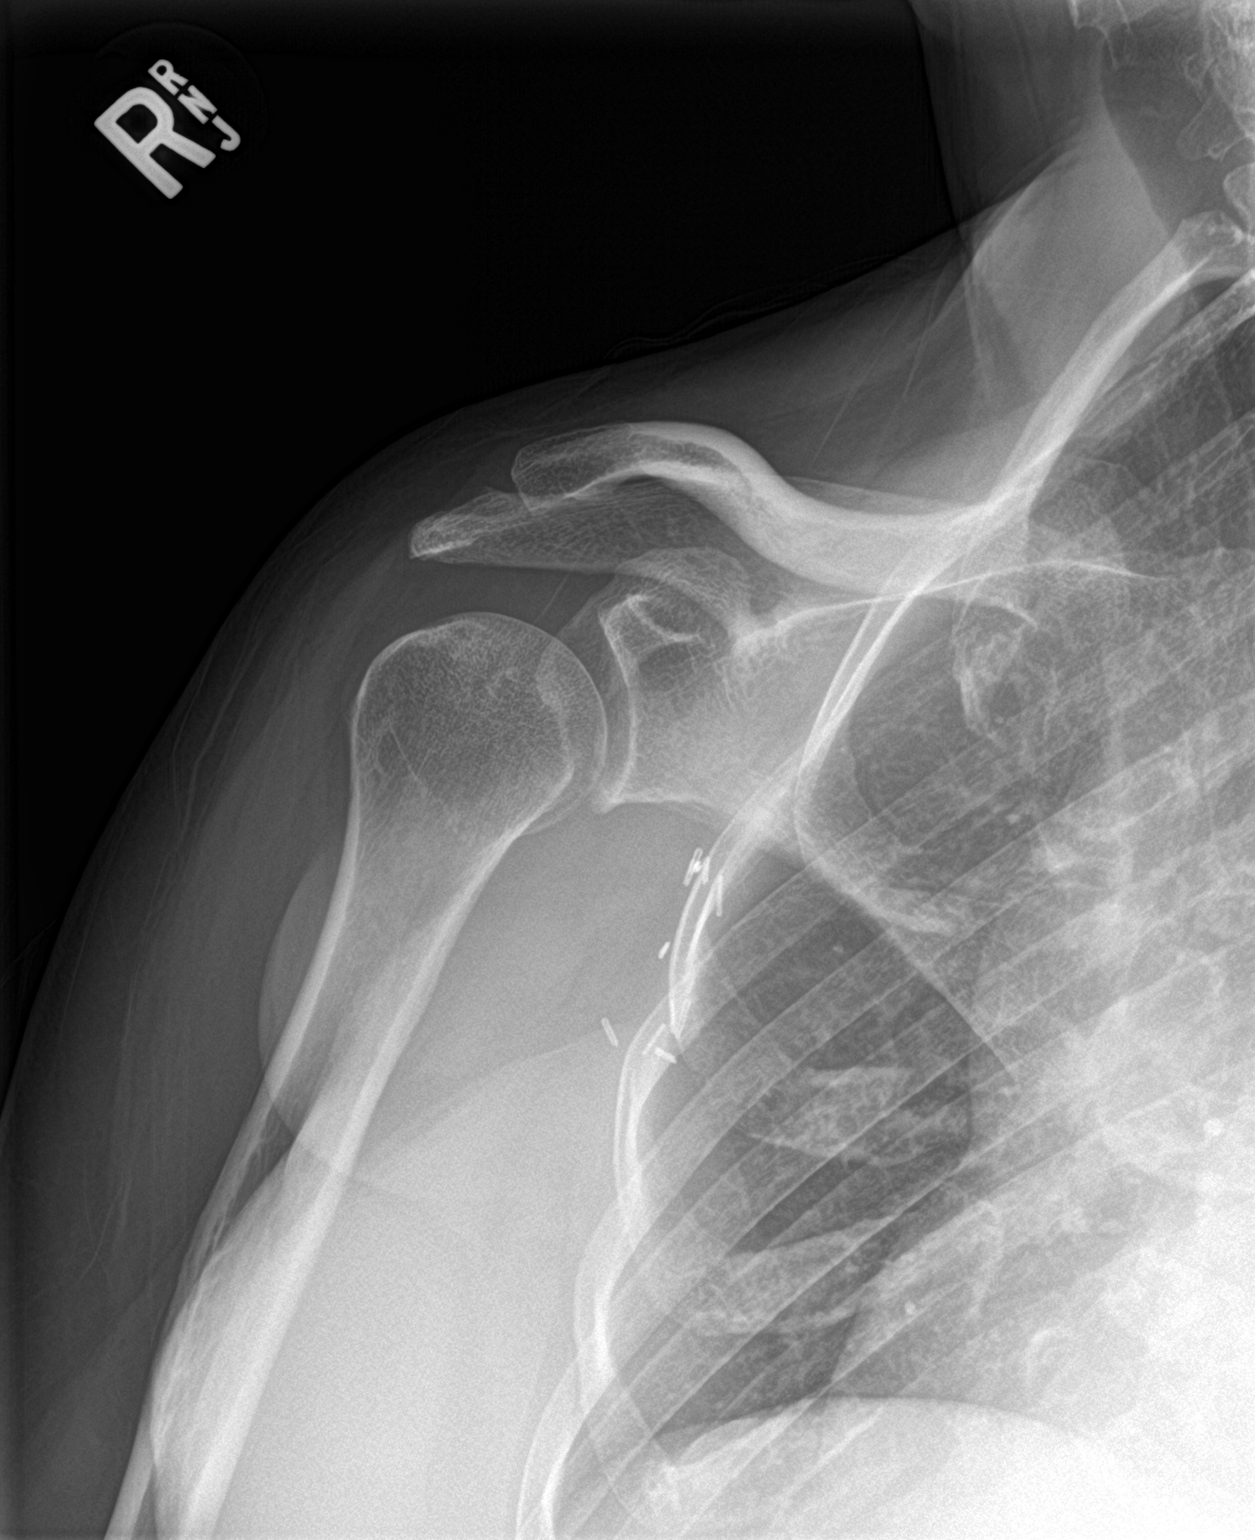

[shoulder y view]
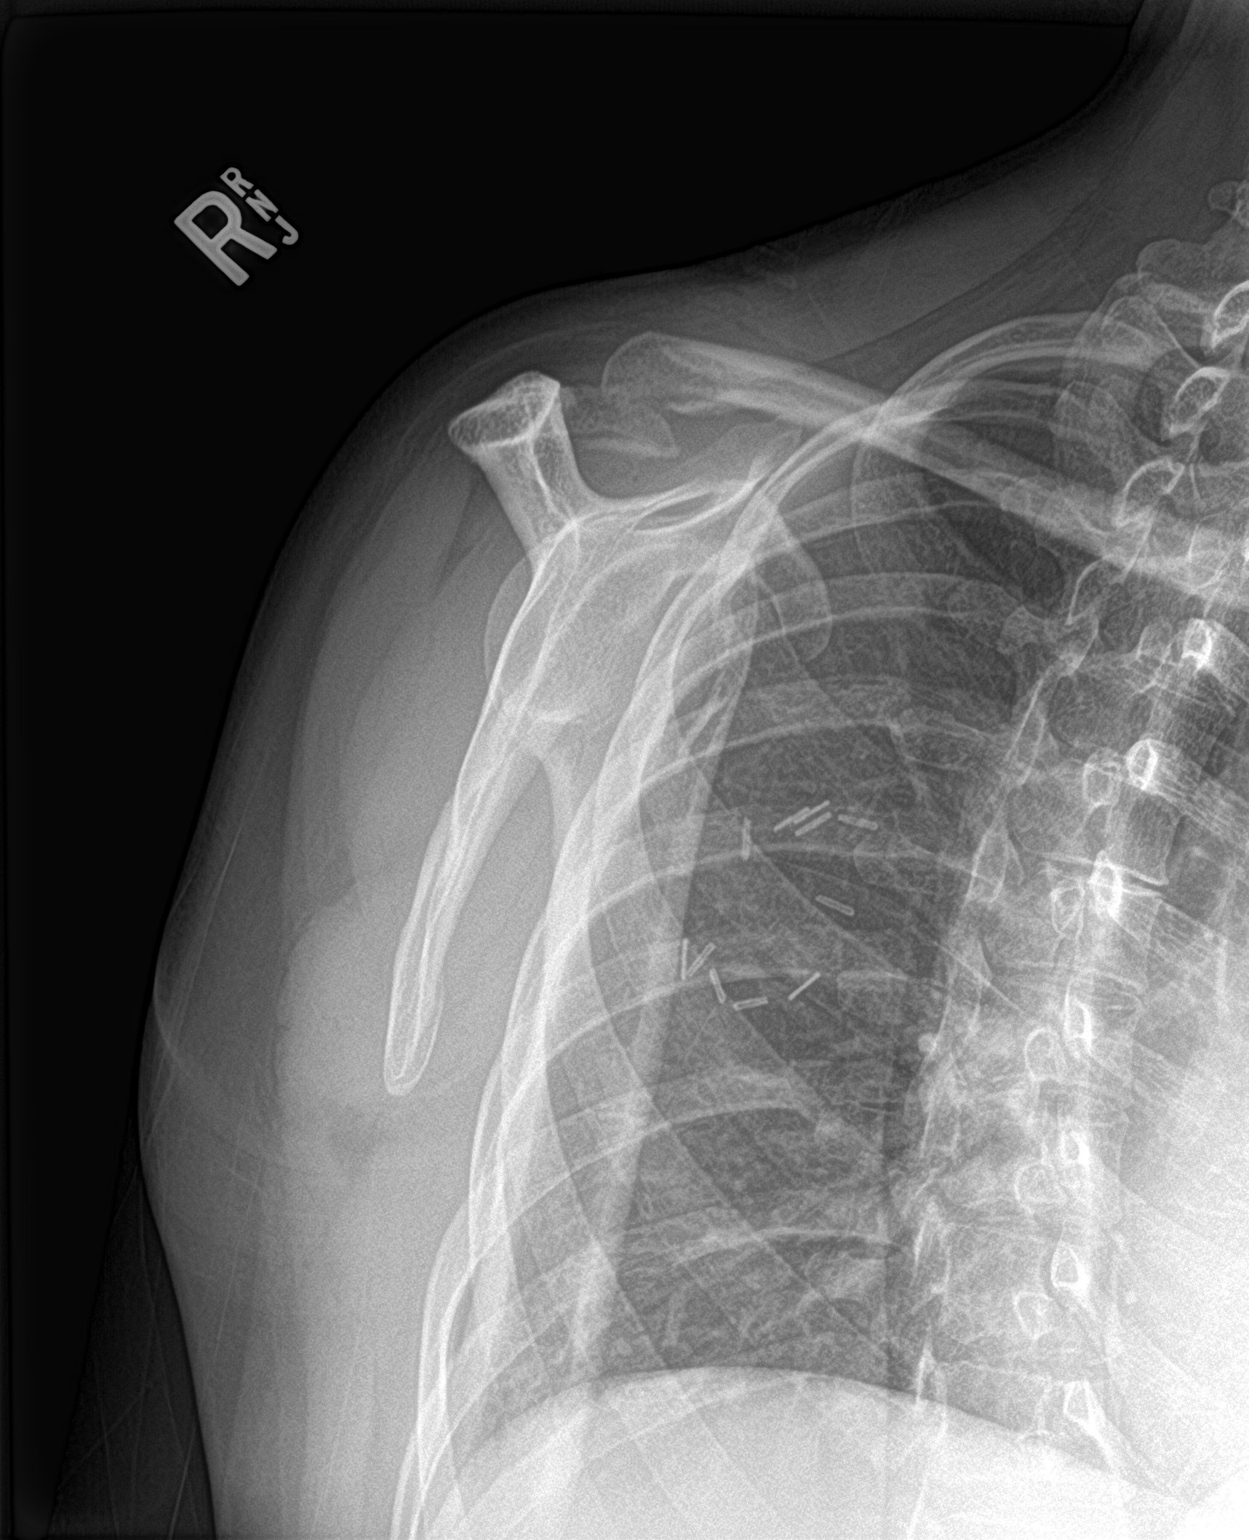

[shoulder axillary]
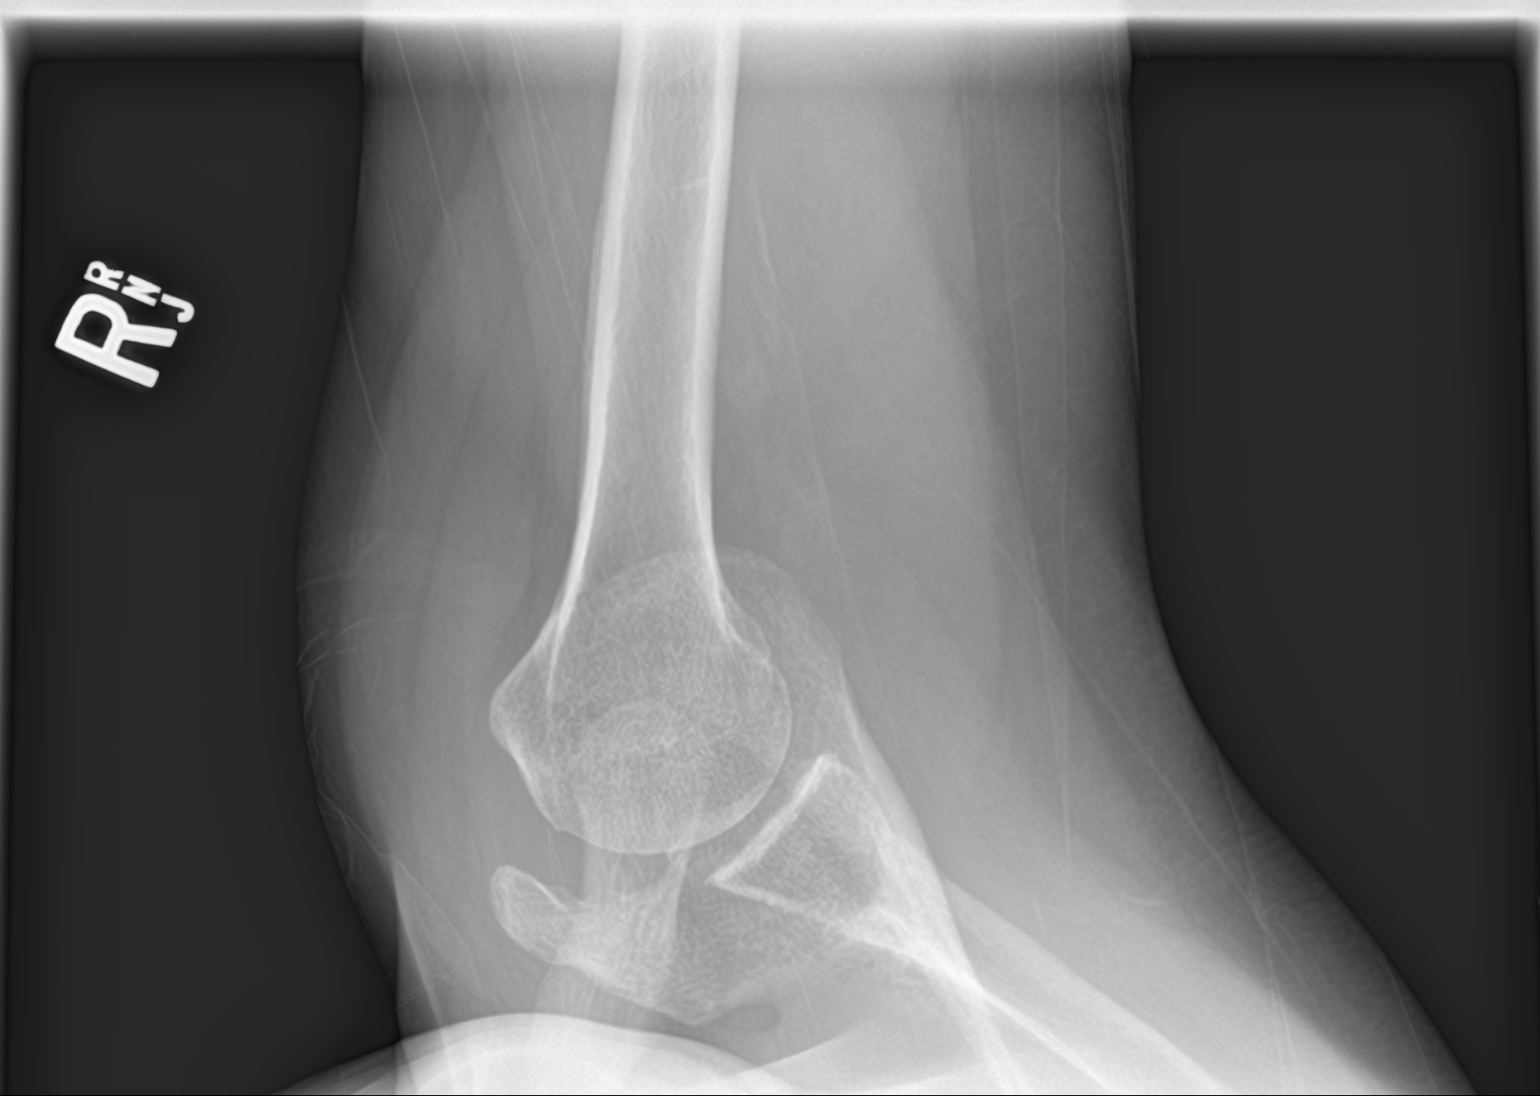

[3 of 3 positions shown; findings below may reference images not displayed]

FINDINGS: RIGHT humerus: There is no evidence of fracture or other focal bone
lesions. Soft tissues are unremarkable.

RIGHT shoulder: The humeral head is well-formed and located. The
subacromial, glenohumeral and acromioclavicular joint spaces are
intact. No destructive bony lesions. Soft tissue planes are
non-suspicious. RIGHT axillary surgical clips.
IMPRESSION: Negative.

## 2019-10-23 ENCOUNTER — Ambulatory Visit: Payer: BC Managed Care – PPO | Admitting: Occupational Therapy

## 2019-10-29 ENCOUNTER — Other Ambulatory Visit: Payer: Self-pay

## 2019-10-29 ENCOUNTER — Ambulatory Visit: Payer: BC Managed Care – PPO | Admitting: Occupational Therapy

## 2019-10-29 DIAGNOSIS — M25611 Stiffness of right shoulder, not elsewhere classified: Secondary | ICD-10-CM

## 2019-10-29 DIAGNOSIS — I972 Postmastectomy lymphedema syndrome: Secondary | ICD-10-CM

## 2019-10-29 NOTE — Therapy (Signed)
[place] [facility] [address] [place], [address] Phone: [phone]   Fax:  [phone]  Occupational Therapy Treatment  Patient Details  Name: Kiara Spencer MRN: [id number] Date of Birth: [birth year] No data recorded  Encounter Date: 10/29/2019  OT End of Session - 10/29/19 1614    Visit Number  14    Number of Visits  16    Date for OT Re-Evaluation  11/14/19    OT Start Time  1515    OT Stop Time  1600    OT Time Calculation (min)  45 min    Activity Tolerance  Patient tolerated treatment well    Behavior During Therapy  [facility] for tasks assessed/performed       Past Medical History:  Diagnosis Date  . Breast cancer ([place])   . Breast cancer of upper-outer quadrant of right female breast ([place]) 12/02/2015  . Headache    hx migraines    Past Surgical History:  Procedure Laterality Date  . BREAST RECONSTRUCTION WITH PLACEMENT OF TISSUE EXPANDER AND FLEX HD (ACELLULAR HYDRATED DERMIS) Bilateral 02/05/2016   Procedure: BILATERAL BREAST RECONSTRUCTION WITH PLACEMENT OF TISSUE EXPANDER AND FLEX HD (ACELLULAR HYDRATED DERMIS);  Surgeon: Loel Lofty Dillingham, DO;  Location: [place];  Service: Plastics;  Laterality: Bilateral;  . CESAREAN SECTION  x 2  . DILATION AND CURETTAGE OF UTERUS    . MASTECTOMY W/ SENTINEL NODE BIOPSY Right 02/05/2016   Procedure: RIGHT NIPPLE SPARING MASTECTOMY WITH RIGHT AXILLARY SENTINEL LYMPH NODE BIOPSY;  Surgeon: [name], MD;  Location: [place];  Service: General;  Laterality: Right;  . MASTECTOMY, PARTIAL Left 02/05/2016   Procedure: LEFT PROPHYLACTIC NIPPLE SPARING MASTECTOMY ;  Surgeon: [name], MD;  Location: [place];  Service: General;  Laterality: Left;  . PERIPHERAL VASCULAR CATHETERIZATION Right 09/16/2016   Procedure: PORTA CATH REMOVAL;  Surgeon: Wallace Going, DO;  Location: [place];  Service: Plastics;  Laterality: Right;  . PORTACATH PLACEMENT  Right 03/02/2016   Procedure: INSERTION PORT-A-CATH WITH ULTRASOUND;  Surgeon: [name], MD;  Location: [place];  Service: General;  Laterality: Right;  IJ  . REMOVAL OF BILATERAL TISSUE EXPANDERS WITH PLACEMENT OF BILATERAL BREAST IMPLANTS Bilateral 09/16/2016   Procedure: REMOVAL OF BILATERAL TISSUE EXPANDERS WITH PLACEMENT OF BILATERAL BREAST IMPLANTS;  Surgeon: Wallace Going, DO;  Location: [place];  Service: Plastics;  Laterality: Bilateral;    There were no vitals filed for this visit.  Subjective Assessment - 10/29/19 1610    Subjective   My R elbow and above it has been bothering me really - like cramp sometimes - in my daysleeve and in my pump    Pertinent History  R breast CA in 2017 with mastectomy, and then recontruction - had chemo and radiation end Jan 2018 - pt report she had little bit of swelling 2018 - but did not need compression sleeve - and did not wear any sleeve since 2017    Patient Stated Goals  I want to know what I need to do to treat this , and take care of my arm so it can get smaller and keep it undercontrol    Currently in Pain?  Yes    Pain Score  2     Pain Location  Elbow   R biceps   Pain Orientation  Right    Pain Descriptors / Indicators  Tender    Pain Type  Acute pain    Pain Onset  1 to 4 weeks ago    Pain Frequency  Occasional          LYMPHEDEMA/ONCOLOGY QUESTIONNAIRE - 10/29/19 1623      Right Upper Extremity Lymphedema   15 cm Proximal to Olecranon Process  32 cm    10 cm Proximal to Olecranon Process  31.1 cm    Olecranon Process  24.8 cm    15 cm Proximal to Ulnar Styloid Process  24.5 cm    10 cm Proximal to Ulnar Styloid Process  21 cm    Just Proximal to Ulnar Styloid Process  14.8 cm    Across Hand at [employer]  18 cm    At [place] of 2nd Digit  6 cm    At [facility] of Thumb  6 cm      Pt arrive with her daytime compression sleeve and glove in place  Pt complain of some cramping  over biceps on the R -randomly - upon assessment - found pt carrying her heavy pocket book on R elbow and back pack over R shoulder  Tenderness over Bicep and with resistance to biceps   Measurements taken - see flow sheet - did decrease compare to last time     Pt to cont with wearing her compression garments  And use her pump am and pm  Cont with R shoulder ABD and flexion stretches in the am - shower - tight in axilla - and scar tissue   And Review  And add every other day -  1:  shoulder flexion Y off the wall over head  1x 10- 12   2:  RTB scapula retraction  1x 10-12reps Next week 2 sets  3rd week -can do 3 sets for band  And add can of soup to shoulder flexion Y off wall  But 1 sets again 10-12  And 4th week 2 sets  If no pain in shoulder   Stop wearing back pack hanging over R shoulder and po           OT Education - 10/29/19 1614    Education Details  modifications for carrying her back pack and pocket book - HEP    Person(s) Educated  Patient    Methods  Explanation;Demonstration;Tactile cues;Verbal cues;Handout    Comprehension  Returned demonstration;Verbalized understanding       OT Short Term Goals - 10/03/19 1718      OT SHORT TERM GOAL #1   Title  Pt R UE circumference decrease by 2 cm upper arm  and elbow, 1 cm at wrist to elbow  to be fitted wtih correct compressoin garments    Baseline  await night time garment stil    Time  2    Period  Weeks    Status  On-going    Target Date  10/17/19        OT Long Term Goals - 10/03/19 1718      OT LONG TERM GOAL #1   Title  Pt and family to be independent in self MLD, soft tissue mobs , exercises for R shoulder and self bandaging    Status  Achieved      OT LONG TERM GOAL #2   Title  Pt to be independing in wearing correct compression garments for day and night time to keep R UE lymphedema under control    Baseline  daytime sleeve fits well - but glove color wrong -and night  time should be in  next week per DME company -and pump had trial today    Time  6    Period  Weeks    Status  On-going    Target Date  11/14/19            Plan - 10/29/19 1620    Clinical Impression Statement  Pt R UE circumference decrease since last time - wearing Jubilee night sleeve , Jobst Elvarex soft sleeve and glove - and pump am and pm - but pt had some tenderness over Biceps - pt has been carrying her heavy pocketbook ofabout 6 lbs on elbow ,and Back pack on R shoulder - pt ed on using back pack correctly and not pocket book on R - lighter and on L - cont with HEP and strenghtening for month - will follow up    OT Occupational Profile and History  Problem Focused Assessment - Including review of records relating to presenting problem    Occupational performance deficits (Please refer to evaluation for details):  ADL's;IADL's;Work;Play;Leisure    Body Structure / Function / Physical Skills  ADL;UE functional use;ROM;Flexibility;Edema    Rehab Potential  Good    Clinical Decision Making  Limited treatment options, no task modification necessary    Comorbidities Affecting Occupational Performance:  None    Modification or Assistance to Complete Evaluation   No modification of tasks or assist necessary to complete eval    OT Frequency  Monthly    OT Duration  4 weeks    OT Treatment/Interventions  Self-care/ADL training;Manual lymph drainage;Patient/family education;Compression bandaging;Scar mobilization;Manual Therapy;Passive range of motion    Plan  pt cont with HEP for month    OT Home Exercise Plan  see pt instruction    Consulted and Agree with Plan of Care  Patient       Patient will benefit from skilled therapeutic intervention in order to improve the following deficits and impairments:   Body Structure / Function / Physical Skills: ADL, UE functional use, ROM, Flexibility, Edema       Visit Diagnosis: Postmastectomy lymphedema syndrome  Stiffness of right shoulder, not elsewhere  classified    Problem List Patient Active Problem List   Diagnosis Date Noted  . Hypersensitivity reaction 03/16/2016  . Other iron deficiency anemia 02/16/2016  . Genetic testing 12/22/2015  . Family history of cancer 12/22/2015  . Family history of breast cancer in mother 12/11/2015  . Breast cancer of upper-outer quadrant of right female breast ([place]) 12/02/2015    Rosalyn Gess OTR/L,CLT 10/29/2019, 4:24 PM  [place] [facility] [address] [place], [address] Phone: [phone]   Fax:  [phone]  Name: Kiara Spencer MRN: [id number] Date of Birth: 1971-10-27

## 2019-10-29 NOTE — Patient Instructions (Signed)
Pt to cont with wearing her compression garments  And use her pump am and pm  Cont with R shoulder ABD and flexion stretches in the am - shower   And then add every other day - 1:  shoulder flexion Y off the wall over head  1x 10- 12  And 2:  RTB scapula retraction  1x 10-12reps Next week 2 sets  3rd week -can do 3 sets for band  And add can of soup to shoulder flexion Y off wall  But 1 sets again 10-12  And 4th week 2 sets  If no pain in shoulder   Stop wearing back pack hanging over R shoulder and pocket book on R elbow - use back pack - and lighter pocket book but on L

## 2019-11-11 IMAGING — NM NUCLEAR MEDICINE WHOLE BODY BONE SCINTIGRAPHY
2 series · 2 of 2 positions shown · non-contrast
Comparison: Radiographs 01/25/2019

CLINICAL DATA: Right shoulder, arm and elbow pain.

EXAM:
NUCLEAR MEDICINE WHOLE BODY BONE SCAN
TECHNIQUE: Whole body anterior and posterior images were obtained approximately
3 hours after intravenous injection of radiopharmaceutical.
RADIOPHARMACEUTICALS:  History of breast cancer. MCi Vechnetium-ZZm
MDP IV

[Series 1: wbr_bone_40 whole body · 2.66mm/px · 1 of 1 slices shown (1 of 2)]
[im 1/1]
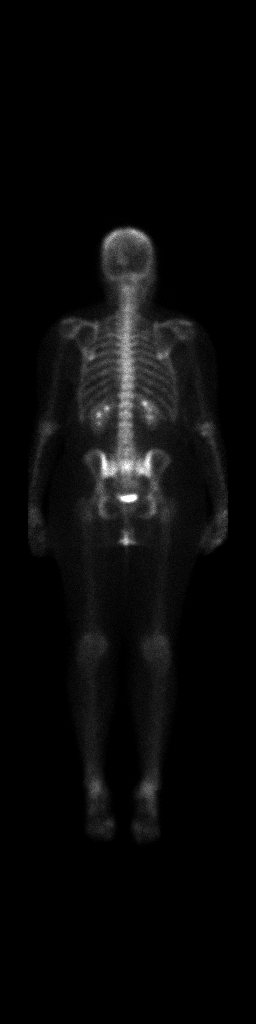

[Series 1: wbr_bone_40 whole body · 2.66mm/px · 1 of 1 slices shown (2 of 2)]
[im 1/1]
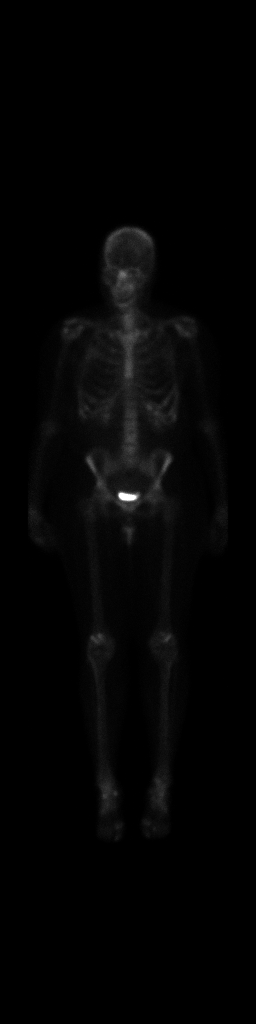

[2 of 2 positions shown; findings below may reference images not displayed]

FINDINGS: No definite areas of hypermetabolism to suggest osseous metastatic
disease. Mild areas of degenerative type uptake involving the knees
and ankles/feet.
IMPRESSION: No findings suspicious for osseous metastatic disease.

## 2019-11-27 ENCOUNTER — Ambulatory Visit: Payer: BC Managed Care – PPO | Attending: Hematology and Oncology | Admitting: Occupational Therapy

## 2019-12-18 ENCOUNTER — Ambulatory Visit: Payer: BC Managed Care – PPO | Attending: Internal Medicine

## 2019-12-18 DIAGNOSIS — Z20822 Contact with and (suspected) exposure to covid-19: Secondary | ICD-10-CM

## 2019-12-20 LAB — NOVEL CORONAVIRUS, NAA: SARS-CoV-2, NAA: NOT DETECTED

## 2019-12-24 ENCOUNTER — Telehealth: Payer: Self-pay

## 2020-01-21 NOTE — Telephone Encounter (Signed)
Ready to close out 

## 2020-01-27 NOTE — Progress Notes (Signed)
Patient Care Team: Brien Few, MD as PCP - General (Obstetrics and Gynecology) [name], MD as Consulting Physician (General Surgery) Nicholas Lose, MD as Consulting Physician (Hematology and Oncology) Kyung Rudd, MD as Consulting Physician (Radiation Oncology) Gardenia Phlegm, NP as Nurse Practitioner (Hematology and Oncology)  DIAGNOSIS:    ICD-10-CM   1. Malignant neoplasm of upper-outer quadrant of right breast in female, estrogen receptor positive ([place])  C50.411    Z17.0     SUMMARY OF ONCOLOGIC HISTORY: Oncology History  Breast cancer of upper-outer quadrant of right female breast ([place])  11/24/2015 Mammogram   2 suspicious groups of calcifications right breast UOQ posteriorly 2.2 x 2.3 x 2.5 cm (by U/S measured 1.2 x 0.8 x 1.2 cm); anteriorly 1.1 x 1.2 x 0.8 cm (not seen by ultrasound)   11/27/2015 Initial Diagnosis   Right breast biopsy UOQ Posterior: IDC with DCIS with, LVI present, ER 100%, PR 20%, Ki-67 30%, HER-2 negative; ANTERIOR: Complex sclerosing lesions with calcifications   02/05/2016 Surgery   Rt. Mastectomy: IDC Grade 1, 3 cm, 1/11 LN positive,with IG DCIS 0.1 cm margin, Diffuse LVI, cancer at axill tail. T2N1 (Stage 2B); Left mastectomy: Fibrocystic ch, Mammaprint high risk     03/15/2016 - 07/26/2016 Chemotherapy   Dose dense Adriamycin and Cytoxan 4 followed by Abraxane weekly 12   10/11/2016 - 12/09/2016 Radiation Therapy   Adj XRT at [facility]   01/10/2017 -  Anti-estrogen oral therapy   Letrozole 2.5 mg daily     CHIEF COMPLIANT: Follow-up of right breast cancer on letrozole therapy  INTERVAL HISTORY: Kiara Spencer is a 49 y.o. with above-mentioned history of right breast cancer treated with mastectomy, adjuvant chemotherapy, radiation, and who is currently on letrozole. She presents to the clinic today for follow-up.  She has been tolerating letrozole extremely well with the help of calcium tablets.  When she does not take  calcium she feels achy all over.  ALLERGIES:  is allergic to cephalexin; ciprofloxacin; and propofol.  MEDICATIONS:  Current Outpatient Medications  Medication Sig Dispense Refill  . aspirin-acetaminophen-caffeine (EXCEDRIN MIGRAINE) 250-250-65 MG tablet Take 2 tablets by mouth every 6 (six) hours as needed for headache.    . Biotin 10000 MCG TABS Take 10 mg by mouth daily.     . Calcium Carb-Cholecalciferol (CALCIUM-VITAMIN D) 500-200 MG-UNIT tablet Take 1 tablet by mouth daily.    [name] letrozole (FEMARA) 2.5 MG tablet Take 1 tablet (2.5 mg total) by mouth daily. 90 tablet 3   No current facility-administered medications for this visit.    PHYSICAL EXAMINATION: ECOG PERFORMANCE STATUS: 1 - Symptomatic but completely ambulatory  Vitals:   01/28/20 1431  BP: 120/84  Pulse: 74  Resp: 20  Temp: 98.7 F (37.1 C)  SpO2: 100%   Filed Weights   01/28/20 1431  Weight: 144 lb 8 oz (65.5 kg)    BREAST: Bilateral mastectomies with implants (exam performed in the presence of a chaperone)  LABORATORY DATA:  I have reviewed the data as listed CMP Latest Ref Rng & Units 09/24/2018 01/10/2017 07/26/2016  Glucose 70 - 99 mg/dL 74 86 94  BUN 6 - 20 mg/dL 18 16.0 9.3  Creatinine 0.44 - 1.00 mg/dL 0.68 0.9 0.7  Sodium 135 - 145 mmol/L 140 144 143  Potassium 3.5 - 5.1 mmol/L 4.5 4.1 4.6  Chloride 98 - 111 mmol/L 105 - -  CO2 22 - 32 mmol/L '29 28 25  ' Calcium 8.9 - 10.3 mg/dL 9.7 10.3 9.4  Total Protein 6.5 - 8.1 g/dL 7.3 7.0 6.4  Total Bilirubin 0.3 - 1.2 mg/dL 0.5 0.40 0.30  Alkaline Phos 38 - 126 U/L 56 61 51  AST 15 - 41 U/L '20 22 22  ' ALT 0 - 44 U/L '15 21 18    ' Lab Results  Component Value Date   WBC 4.4 09/24/2018   HGB 13.0 09/24/2018   HCT 41.0 09/24/2018   MCV 94.7 09/24/2018   PLT 251 09/24/2018   NEUTROABS 2.1 01/10/2017    ASSESSMENT & PLAN:  Breast cancer of upper-outer quadrant of right female breast ([place]) Rt. Mastectomy 02/05/16: IDC Grade 1, 3 cm, 1/11 LN positive,with  IG DCIS 0.1 cm margin, Diffuse LVI, cancer at axill tail. T2N1 (Stage 2B); Left mastectomy: Fibrocystic ch. Mammaprint high risk  Treatment summary: 1. No indication for repeat resection since there is nothing further to move in the posterior side 2. Mammaprint: High risk, adjuvant chemotherapy with dose dense Adriamycin Cytoxan 4 followed by weekly Abraxane 12; 03/15/2016- 07/26/2016 3. Followed by radiation therapy (10/11/2016- 12/09/2016) 4. Followed by antiestrogen therapy: Letrozole 2.5 mgdailystarted 01/10/2017 --------------------------------------------------------------------------------------------------------------------------------------------- Letrozole toxicities: 1.Intermittent hot flashes 2.  Muscle aches and pains resolved by taking calcium  Pain in the right arm: Resolved  Breast cancer surveillance: 1.  Breast MRI 06/29/2017: Asymmetric enhancement around the skin and subcutaneous fat and musculature of the reconstructed right breast/right chest wall, possible edema possible inflammation, implants are intact 2.    Bone scan 02/16/2019: Negative for bone metastatic disease. No role of imaging studies because she had bilateral mastectomies. Breast exam: Benign  Return to clinic in 1 year for follow-up    No orders of the defined types were placed in this encounter.  The patient has a good understanding of the overall plan. she agrees with it. she will call with any problems that may develop before the next visit here.  Total time spent: 20 mins including face to face time and time spent for planning, charting and coordination of care  Nicholas Lose, MD 01/28/2020  I, Cloyde Reams Dorshimer, am acting as scribe for Dr. Nicholas Lose.  I have reviewed the above documentation for accuracy and completeness, and I agree with the above.

## 2020-01-28 ENCOUNTER — Other Ambulatory Visit: Payer: Self-pay

## 2020-01-28 ENCOUNTER — Inpatient Hospital Stay: Payer: BC Managed Care – PPO | Attending: Hematology and Oncology | Admitting: Hematology and Oncology

## 2020-01-28 ENCOUNTER — Ambulatory Visit: Payer: BC Managed Care – PPO

## 2020-01-28 DIAGNOSIS — Z170 Estrogen receptor positive status [ER+]: Secondary | ICD-10-CM | POA: Diagnosis not present

## 2020-01-28 DIAGNOSIS — Z9221 Personal history of antineoplastic chemotherapy: Secondary | ICD-10-CM | POA: Insufficient documentation

## 2020-01-28 DIAGNOSIS — Z79811 Long term (current) use of aromatase inhibitors: Secondary | ICD-10-CM | POA: Diagnosis not present

## 2020-01-28 DIAGNOSIS — C50411 Malignant neoplasm of upper-outer quadrant of right female breast: Secondary | ICD-10-CM

## 2020-01-28 MED ORDER — LETROZOLE 2.5 MG PO TABS: 2.5000 mg | ORAL_TABLET | Freq: Every day | ORAL | 3 refills | Status: DC

## 2020-01-28 NOTE — Assessment & Plan Note (Signed)
Rt. 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 T2N1 (Stage 2B); Left mastectomy: Fibrocystic ch. Mammaprint high risk  Treatment summary: 1. No indication for repeat resection since there is nothing further to move in the posterior side 2. Mammaprint: High risk, adjuvant chemotherapy with dose dense Adriamycin Cytoxan 4 followed by weekly Abraxane 12; 03/15/2016- 07/26/2016 3. Followed by radiation therapy (10/11/2016- 12/09/2016) 4. Followed by antiestrogen therapy: Letrozole 2.5 mgdailystarted 01/10/2017 --------------------------------------------------------------------------------------------------------------------------------------------- Letrozole toxicities: 1.Intermittent hot flashes  Pain in the right arm: Resolved  Breast cancer surveillance: 1.  Breast MRI 06/29/2017: Asymmetric enhancement around the skin and subcutaneous fat and musculature of the reconstructed right breast/right chest wall, possible edema possible inflammation, implants are intact 2.    Bone scan 02/16/2019: Negative for bone metastatic disease.  Return to clinic in 1 year for follow-up

## 2020-08-07 ENCOUNTER — Encounter: Payer: Self-pay | Admitting: Sports Medicine

## 2020-08-07 ENCOUNTER — Ambulatory Visit: Payer: BC Managed Care – PPO | Admitting: Sports Medicine

## 2020-08-07 ENCOUNTER — Other Ambulatory Visit: Payer: Self-pay

## 2020-08-07 DIAGNOSIS — M79674 Pain in right toe(s): Secondary | ICD-10-CM | POA: Diagnosis not present

## 2020-08-07 DIAGNOSIS — M79675 Pain in left toe(s): Secondary | ICD-10-CM | POA: Diagnosis not present

## 2020-08-07 DIAGNOSIS — B351 Tinea unguium: Secondary | ICD-10-CM | POA: Diagnosis not present

## 2020-08-07 NOTE — Progress Notes (Signed)
Subjective: Kiara Spencer is a 49 y.o. female patient seen today in office with complaint of mildly painful thickened and discolored nails. Patient is desiring treatment for nail changes; has tried OTC topicals/Medication and self trimming and soaking in the past with no improvement. Reports that nails are becoming difficult to manage because of the thickness and the flaky skin underneath that seems to be getting worse reports that she has also tried biotin without any improvement and states that she started to notice these changes after she had chemo treatment in 2017 for breast cancer.. Patient has no other pedal complaints at this time.   Review of systems noncontributory  Patient Active Problem List   Diagnosis Date Noted  . Hypersensitivity reaction 03/16/2016  . Other iron deficiency anemia 02/16/2016  . Genetic testing 12/22/2015  . Family history of cancer 12/22/2015  . Family history of breast cancer in mother 12/11/2015  . Breast cancer of upper-outer quadrant of right female breast ([place]) 12/02/2015    Current Outpatient Medications on File Prior to Visit  Medication Sig Dispense Refill  . aspirin-acetaminophen-caffeine (EXCEDRIN MIGRAINE) 250-250-65 MG tablet Take 2 tablets by mouth every 6 (six) hours as needed for headache.    . Biotin 10000 MCG TABS Take 10 mg by mouth daily.     . Calcium Carb-Cholecalciferol (CALCIUM-VITAMIN D) 500-200 MG-UNIT tablet Take 1 tablet by mouth daily.    [name] letrozole (FEMARA) 2.5 MG tablet Take 1 tablet (2.5 mg total) by mouth daily. 90 tablet 3   No current facility-administered medications on file prior to visit.    Allergies  Allergen Reactions  . Cephalexin Rash  . Ciprofloxacin Rash  . Propofol Rash    Another anesthesia medication as well - name unknown    Objective: Physical Exam  General: Well developed, nourished, no acute distress, awake, alert and oriented x 3  Vascular: Dorsalis pedis artery 2/4 bilateral, Posterior  tibial artery 2/4 bilateral, skin temperature warm to warm proximal to distal bilateral lower extremities, no varicosities, pedal hair present bilateral.  Neurological: Gross sensation present via light touch bilateral.   Dermatological: Skin is warm, dry, and supple bilateral, bilateral hallux and fifth toes are tender, short thick, and discolored with mild subungal debris, there is distal lifting especially at the first toes bilateral consistent with onycholysis, no webspace macerations present bilateral, no open lesions present bilateral, no callus/corns/hyperkeratotic tissue present bilateral. No signs of infection bilateral.  Musculoskeletal: Asymptomatic varus rotated hammertoe boney deformities noted bilateral. Muscular strength within normal limits without painon range of motion. No pain with calf compression bilateral.  Assessment and Plan:  Problem List Items Addressed This Visit    None    Visit Diagnoses    Nail fungus    -  Primary   Relevant Orders   Culture, fungus without smear   Toe pain, bilateral          -Examined patient -Discussed treatment options for painful dystrophic nails  -Fungal culture was obtained by removing a portion of the hard nail itself from each of the involved toenails using a sterile nail nipper and sent to [facility] lab. Patient tolerated the biopsy procedure well without discomfort or need for anesthesia.  -Patient to return in 4 weeks for follow up evaluation and discussion of fungal culture results or sooner if symptoms worsen.  Landis Martins, DPM

## 2020-09-18 ENCOUNTER — Ambulatory Visit: Payer: BC Managed Care – PPO | Admitting: Sports Medicine

## 2020-09-18 ENCOUNTER — Other Ambulatory Visit: Payer: Self-pay

## 2020-09-18 ENCOUNTER — Encounter: Payer: Self-pay | Admitting: Sports Medicine

## 2020-09-18 DIAGNOSIS — L603 Nail dystrophy: Secondary | ICD-10-CM | POA: Diagnosis not present

## 2020-09-18 DIAGNOSIS — M79674 Pain in right toe(s): Secondary | ICD-10-CM

## 2020-09-18 DIAGNOSIS — M79675 Pain in left toe(s): Secondary | ICD-10-CM

## 2020-09-18 NOTE — Progress Notes (Signed)
Subjective: Kiara Spencer is a 49 y.o. female patient seen today in office for fungal culture results. Patient has no other pedal complaints at this time.   Patient Active Problem List   Diagnosis Date Noted   Hypersensitivity reaction 03/16/2016   Other iron deficiency anemia 02/16/2016   Genetic testing 12/22/2015   Family history of cancer 12/22/2015   Family history of breast cancer in mother 12/11/2015   Breast cancer of upper-outer quadrant of right female breast ([place]) 12/02/2015    Current Outpatient Medications on File Prior to Visit  Medication Sig Dispense Refill   aspirin-acetaminophen-caffeine (EXCEDRIN MIGRAINE) 250-250-65 MG tablet Take 2 tablets by mouth every 6 (six) hours as needed for headache.     Biotin 10000 MCG TABS Take 10 mg by mouth daily.      Calcium Carb-Cholecalciferol (CALCIUM-VITAMIN D) 500-200 MG-UNIT tablet Take 1 tablet by mouth daily.     letrozole (FEMARA) 2.5 MG tablet Take 1 tablet (2.5 mg total) by mouth daily. 90 tablet 3   No current facility-administered medications on file prior to visit.    Allergies  Allergen Reactions   Cephalexin Rash   Ciprofloxacin Rash   Propofol Rash    Another anesthesia medication as well - name unknown    Objective: Physical Exam  General: Well developed, nourished, no acute distress, awake, alert and oriented x 3  Vascular: Dorsalis pedis artery 2/4 bilateral, Posterior tibial artery 2/4 bilateral, skin temperature warm to warm proximal to distal bilateral lower extremities, no varicosities, pedal hair present bilateral.  Neurological: Gross sensation present via light touch bilateral.   Dermatological: Skin is warm, dry, and supple bilateral, bilateral hallux and fifth toes are short thick, and discolored with mild subungal debris, there is distal lifting especially at the first toes bilateral consistent with onycholysis, no webspace macerations present bilateral, no open lesions present  bilateral, no callus/corns/hyperkeratotic tissue present bilateral. No signs of infection bilateral.  Musculoskeletal: Asymptomatic varus rotated hammertoe boney deformities noted bilateral. Muscular strength within normal limits without painon range of motion. No pain with calf compression bilateralnoted bilateral. Muscular strength within normal limits without painon range of motion. No pain with calf compression bilateral.  Fungal culture negative suggestive of microtrauma  Assessment and Plan:  Problem List Items Addressed This Visit    None    Visit Diagnoses    Nail dystrophy    -  Primary   Toe pain, bilateral          -Examined patient -Discussed treatment options for nail dystrophy -Offered patient to purchase at front desk [place] nail renewal gel -Advised patient to avoid wearing shoes without socks or wear her toes can get a lot of pressure -Advised good hygiene habits -Patient to return as needed or sooner if symptoms worsen.  Landis Martins, DPM

## 2021-01-26 NOTE — Progress Notes (Signed)
Patient Care Team: Brien Few, MD as PCP - General (Obstetrics and Gynecology) [name], MD as Consulting Physician (General Surgery) Nicholas Lose, MD as Consulting Physician (Hematology and Oncology) Kyung Rudd, MD as Consulting Physician (Radiation Oncology) Gardenia Phlegm, NP as Nurse Practitioner (Hematology and Oncology)  DIAGNOSIS:    ICD-10-CM   1. Malignant neoplasm of upper-outer quadrant of right breast in female, estrogen receptor positive ([place])  C50.411    Z17.0     SUMMARY OF ONCOLOGIC HISTORY: Oncology History  Breast cancer of upper-outer quadrant of right female breast ([place])  11/24/2015 Mammogram   2 suspicious groups of calcifications right breast UOQ posteriorly 2.2 x 2.3 x 2.5 cm (by U/S measured 1.2 x 0.8 x 1.2 cm); anteriorly 1.1 x 1.2 x 0.8 cm (not seen by ultrasound)   11/27/2015 Initial Diagnosis   Right breast biopsy UOQ Posterior: IDC with DCIS with, LVI present, ER 100%, PR 20%, Ki-67 30%, HER-2 negative; ANTERIOR: Complex sclerosing lesions with calcifications   02/05/2016 Surgery   Rt. Mastectomy: IDC Grade 1, 3 cm, 1/11 LN positive,with IG DCIS 0.1 cm margin, Diffuse LVI, cancer at axill tail. T2N1 (Stage 2B); Left mastectomy: Fibrocystic ch, Mammaprint high risk     03/15/2016 - 07/26/2016 Chemotherapy   Dose dense Adriamycin and Cytoxan 4 followed by Abraxane weekly 12   10/11/2016 - 12/09/2016 Radiation Therapy   Adj XRT at [facility]   01/10/2017 -  Anti-estrogen oral therapy   Letrozole 2.5 mg daily     CHIEF COMPLIANT: Follow-up of right breast cancer on letrozole therapy  INTERVAL HISTORY: Kiara Spencer is a 50 y.o. with above-mentioned history of right breast cancer treated with mastectomy, adjuvant chemotherapy, radiation, and who is currently on letrozole.She presents to the clinic today for follow-up.   She is tolerating letrozole extremely well.  She does not have any major problems.  She does have hot flashes  which are there even before.  Denies any major muscle stiffness or achiness.  Denies any lumps or nodules in the reconstructed breast, chest wall or axilla.  ALLERGIES:  is allergic to cephalexin, ciprofloxacin, and propofol.  MEDICATIONS:  Current Outpatient Medications  Medication Sig Dispense Refill  . aspirin-acetaminophen-caffeine (EXCEDRIN MIGRAINE) 250-250-65 MG tablet Take 2 tablets by mouth every 6 (six) hours as needed for headache.    . Biotin 10000 MCG TABS Take 10 mg by mouth daily.     . Calcium Carb-Cholecalciferol (CALCIUM-VITAMIN D) 500-200 MG-UNIT tablet Take 1 tablet by mouth daily.    [name] letrozole (FEMARA) 2.5 MG tablet Take 1 tablet (2.5 mg total) by mouth daily. 90 tablet 3   No current facility-administered medications for this visit.    PHYSICAL EXAMINATION: ECOG PERFORMANCE STATUS: 1 - Symptomatic but completely ambulatory  Vitals:   01/27/21 1446  BP: 126/88  Pulse: 68  Resp: 20  Temp: 97.9 F (36.6 C)  SpO2: 99%   Filed Weights   01/27/21 1446  Weight: 146 lb 11.2 oz (66.5 kg)    BREAST: No palpable masses or nodules in either right or left breasts. No palpable axillary supraclavicular or infraclavicular adenopathy no breast tenderness or nipple discharge. (exam performed in the presence of a chaperone)  LABORATORY DATA:  I have reviewed the data as listed CMP Latest Ref Rng & Units 09/24/2018 01/10/2017 07/26/2016  Glucose 70 - 99 mg/dL 74 86 94  BUN 6 - 20 mg/dL 18 16.0 9.3  Creatinine 0.44 - 1.00 mg/dL 0.68 0.9 0.7  Sodium 135 -  145 mmol/L 140 144 143  Potassium 3.5 - 5.1 mmol/L 4.5 4.1 4.6  Chloride 98 - 111 mmol/L 105 - -  CO2 22 - 32 mmol/L '29 28 25  ' Calcium 8.9 - 10.3 mg/dL 9.7 10.3 9.4  Total Protein 6.5 - 8.1 g/dL 7.3 7.0 6.4  Total Bilirubin 0.3 - 1.2 mg/dL 0.5 0.40 0.30  Alkaline Phos 38 - 126 U/L 56 61 51  AST 15 - 41 U/L '20 22 22  ' ALT 0 - 44 U/L '15 21 18    ' Lab Results  Component Value Date   WBC 4.4 09/24/2018   HGB 13.0  09/24/2018   HCT 41.0 09/24/2018   MCV 94.7 09/24/2018   PLT 251 09/24/2018   NEUTROABS 2.1 01/10/2017    ASSESSMENT & PLAN:  Breast cancer of upper-outer quadrant of right female breast ([place]) Rt. 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 T2N1 (Stage 2B); Left mastectomy: Fibrocystic ch. Mammaprint high risk  Treatment summary: 1. No indication for repeat resection since there is nothing further to move in the posterior side 2. Mammaprint: High risk, adjuvant chemotherapy with dose dense Adriamycin Cytoxan 4 followed by weekly Abraxane 12; 03/15/2016- 07/26/2016 3. Followed by radiation therapy (10/11/2016- 12/09/2016) 4. Followed by antiestrogen therapy: Letrozole 2.5 mgdailystarted 01/10/2017 --------------------------------------------------------------------------------------------------------------------------------------------- Letrozole toxicities: 1.Intermittent hot flashes 2.  Muscle aches and pains resolved by taking calcium  We discussed the pros and cons of extending adjuvant therapy for 7 years.  She is willing to take it because the side effects are fairly minimal.  Pain in the right arm: Resolved  Breast cancer surveillance: 1.Breast MRI 06/29/2017: Asymmetric enhancement around the skin and subcutaneous fat and musculature of the reconstructed right breast/right chest wall, possible edema possible inflammation, implants are intact 2.  Bone scan 02/16/2019: Negative for bone metastatic disease. No role of imaging studies because she had bilateral mastectomies. Breast exam: Benign  Return to clinic in 1 year for follow-up     No orders of the defined types were placed in this encounter.  The patient has a good understanding of the overall plan. she agrees with it. she will call with any problems that may develop before the next visit here.  Total time spent: 20 mins  including face to face time and time spent for planning, charting and coordination of care  Rulon Eisenmenger, MD, MPH 01/27/2021  I, Cloyde Reams Dorshimer, am acting as scribe for Dr. Nicholas Lose.  I have reviewed the above documentation for accuracy and completeness, and I agree with the above.

## 2021-01-26 NOTE — Assessment & Plan Note (Signed)
Rt. 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 T2N1 (Stage 2B); Left mastectomy: Fibrocystic ch. Mammaprint high risk  Treatment summary: 1. No indication for repeat resection since there is nothing further to move in the posterior side 2. Mammaprint: High risk, adjuvant chemotherapy with dose dense Adriamycin Cytoxan 4 followed by weekly Abraxane 12; 03/15/2016- 07/26/2016 3. Followed by radiation therapy (10/11/2016- 12/09/2016) 4. Followed by antiestrogen therapy: Letrozole 2.5 mgdailystarted 01/10/2017 --------------------------------------------------------------------------------------------------------------------------------------------- Letrozole toxicities: 1.Intermittent hot flashes 2.  Muscle aches and pains resolved by taking calcium  Pain in the right arm: Resolved  Breast cancer surveillance: 1.Breast MRI 06/29/2017: Asymmetric enhancement around the skin and subcutaneous fat and musculature of the reconstructed right breast/right chest wall, possible edema possible inflammation, implants are intact 2.  Bone scan 02/16/2019: Negative for bone metastatic disease. No role of imaging studies because she had bilateral mastectomies. Breast exam: Benign  Return to clinic in 1 year for follow-up

## 2021-01-27 ENCOUNTER — Other Ambulatory Visit: Payer: Self-pay

## 2021-01-27 ENCOUNTER — Inpatient Hospital Stay: Payer: BC Managed Care – PPO | Attending: Hematology and Oncology | Admitting: Hematology and Oncology

## 2021-01-27 DIAGNOSIS — Z9221 Personal history of antineoplastic chemotherapy: Secondary | ICD-10-CM | POA: Insufficient documentation

## 2021-01-27 DIAGNOSIS — C50411 Malignant neoplasm of upper-outer quadrant of right female breast: Secondary | ICD-10-CM | POA: Insufficient documentation

## 2021-01-27 DIAGNOSIS — R232 Flushing: Secondary | ICD-10-CM | POA: Insufficient documentation

## 2021-01-27 DIAGNOSIS — Z170 Estrogen receptor positive status [ER+]: Secondary | ICD-10-CM | POA: Diagnosis not present

## 2021-01-27 DIAGNOSIS — Z79811 Long term (current) use of aromatase inhibitors: Secondary | ICD-10-CM | POA: Diagnosis not present

## 2021-01-27 DIAGNOSIS — Z923 Personal history of irradiation: Secondary | ICD-10-CM | POA: Diagnosis not present

## 2021-01-27 MED ORDER — LETROZOLE 2.5 MG PO TABS: 2.5000 mg | ORAL_TABLET | Freq: Every day | ORAL | 3 refills | Status: DC

## 2022-01-27 ENCOUNTER — Ambulatory Visit: Payer: BC Managed Care – PPO | Admitting: Hematology and Oncology

## 2022-01-27 NOTE — Progress Notes (Signed)
Patient Care Team: Brien Few, MD as PCP - General (Obstetrics and Gynecology) [name], MD as Consulting Physician (General Surgery) Nicholas Lose, MD as Consulting Physician (Hematology and Oncology) Kyung Rudd, MD as Consulting Physician (Radiation Oncology) Gardenia Phlegm, NP as Nurse Practitioner (Hematology and Oncology)  DIAGNOSIS:    ICD-10-CM   1. Malignant neoplasm of upper-outer quadrant of right breast in female, estrogen receptor positive ([place])  C50.411    Z17.0       SUMMARY OF ONCOLOGIC HISTORY: Oncology History  Breast cancer of upper-outer quadrant of right female breast ([place])  11/24/2015 Mammogram   2 suspicious groups of calcifications right breast UOQ posteriorly 2.2 x 2.3 x 2.5 cm (by U/S measured 1.2 x 0.8 x 1.2 cm); anteriorly 1.1 x 1.2 x 0.8 cm (not seen by ultrasound)   11/27/2015 Initial Diagnosis   Right breast biopsy UOQ Posterior: IDC with DCIS with, LVI present, ER 100%, PR 20%, Ki-67 30%, HER-2 negative; ANTERIOR: Complex sclerosing lesions with calcifications   02/05/2016 Surgery   Rt. Mastectomy: IDC Grade 1, 3 cm, 1/11 LN positive,with IG DCIS 0.1 cm margin, Diffuse LVI, cancer at axill tail. T2N1 (Stage 2B); Left mastectomy: Fibrocystic ch, Mammaprint high risk     03/15/2016 - 07/26/2016 Chemotherapy   Dose dense Adriamycin and Cytoxan 4 followed by Abraxane weekly 12   10/11/2016 - 12/09/2016 Radiation Therapy   Adj XRT at [facility]   01/10/2017 -  Anti-estrogen oral therapy   Letrozole 2.5 mg daily     CHIEF COMPLIANT: Follow-up of right breast cancer on letrozole therapy  INTERVAL HISTORY: Kiara Spencer is a 51 y.o. with above-mentioned history of right breast cancer treated with mastectomy, adjuvant chemotherapy, radiation, and who is currently on letrozole. She presents to the clinic today for follow-up.  She has intermittent cramps behind the reconstructed breast but otherwise she is doing quite  well.  ALLERGIES:  is allergic to cephalexin, ciprofloxacin, and propofol.  MEDICATIONS:  Current Outpatient Medications  Medication Sig Dispense Refill   aspirin-acetaminophen-caffeine (EXCEDRIN MIGRAINE) 250-250-65 MG tablet Take 2 tablets by mouth every 6 (six) hours as needed for headache.     Biotin 10000 MCG TABS Take 10 mg by mouth daily.      Calcium Carb-Cholecalciferol (CALCIUM-VITAMIN D) 500-200 MG-UNIT tablet Take 1 tablet by mouth daily.     letrozole (FEMARA) 2.5 MG tablet Take 1 tablet (2.5 mg total) by mouth daily. 90 tablet 3   No current facility-administered medications for this visit.    PHYSICAL EXAMINATION: ECOG PERFORMANCE STATUS: 1 - Symptomatic but completely ambulatory  Vitals:   01/28/22 1426  BP: 133/88  Pulse: 73  Resp: 18  Temp: 97.9 F (36.6 C)  SpO2: 97%   Filed Weights   01/28/22 1426  Weight: 150 lb 9.6 oz (68.3 kg)    BREAST: No palpable masses or nodules in either right or left reconstructed breasts.   (exam performed in the presence of a chaperone)  LABORATORY DATA:  I have reviewed the data as listed CMP Latest Ref Rng & Units 09/24/2018 01/10/2017 07/26/2016  Glucose 70 - 99 mg/dL 74 86 94  BUN 6 - 20 mg/dL 18 16.0 9.3  Creatinine 0.44 - 1.00 mg/dL 0.68 0.9 0.7  Sodium 135 - 145 mmol/L 140 144 143  Potassium 3.5 - 5.1 mmol/L 4.5 4.1 4.6  Chloride 98 - 111 mmol/L 105 - -  CO2 22 - 32 mmol/L '29 28 25  ' Calcium 8.9 - 10.3 mg/dL 9.7 10.3  9.4  Total Protein 6.5 - 8.1 g/dL 7.3 7.0 6.4  Total Bilirubin 0.3 - 1.2 mg/dL 0.5 0.40 0.30  Alkaline Phos 38 - 126 U/L 56 61 51  AST 15 - 41 U/L '20 22 22  ' ALT 0 - 44 U/L '15 21 18    ' Lab Results  Component Value Date   WBC 4.4 09/24/2018   HGB 13.0 09/24/2018   HCT 41.0 09/24/2018   MCV 94.7 09/24/2018   PLT 251 09/24/2018   NEUTROABS 2.1 01/10/2017    ASSESSMENT & PLAN:  Breast cancer of upper-outer quadrant of right female breast ([place]) Rt. Mastectomy 02/05/16: IDC Grade 1, 3 cm, 1/11 LN  positive,with IG DCIS 0.1 cm margin, Diffuse LVI, cancer at axill tail. T2N1 (Stage 2B); Left mastectomy: Fibrocystic ch. Mammaprint high risk   Treatment summary: 1. No indication for repeat resection since there is nothing further to move in the posterior side 2. Mammaprint: High risk, adjuvant chemotherapy with dose dense Adriamycin Cytoxan 4 followed by weekly Abraxane 12; 03/15/2016-  07/26/2016 3. Followed by radiation therapy (10/11/2016- 12/09/2016) 4. Followed by antiestrogen therapy: Letrozole 2.5 mg daily  started 01/10/2017 --------------------------------------------------------------------------------------------------------------------------------------------- Letrozole toxicities:  1. Intermittent hot flashes  2.  Muscle aches and pains resolved by taking calcium Plans to continue for 7 years.    Breast cancer surveillance: No role of imaging studies because she had bilateral mastectomies. Breast exam: Benign   Return to clinic in 1 year for follow-up      No orders of the defined types were placed in this encounter.  The patient has a good understanding of the overall plan. she agrees with it. she will call with any problems that may develop before the next visit here.  Total time spent: 20 mins including face to face time and time spent for planning, charting and coordination of care  Rulon Eisenmenger, MD, MPH 01/28/2022  I, Thana Ates, am acting as scribe for Dr. Nicholas Lose.  I have reviewed the above documentation for accuracy and completeness, and I agree with the above.

## 2022-01-27 NOTE — Assessment & Plan Note (Signed)
Rt. 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 T2N1 (Stage 2B); Left mastectomy: Fibrocystic ch. Mammaprint high risk  Treatment summary: 1. No indication for repeat resection since there is nothing further to move in the posterior side 2. Mammaprint: High risk, adjuvant chemotherapy with dose dense Adriamycin Cytoxan 4 followed by weekly Abraxane 12; 03/15/2016- 07/26/2016 3. Followed by radiation therapy (10/11/2016- 12/09/2016) 4. Followed by antiestrogen therapy: Letrozole 2.5 mgdailystarted 01/10/2017 --------------------------------------------------------------------------------------------------------------------------------------------- Letrozole toxicities: 1.Intermittent hot flashes 2.  Muscle aches and pains resolved by taking calcium  Pain in the right arm: Resolved  Breast cancer surveillance: No role of imaging studies because she had bilateral mastectomies. Breast exam: Benign  Return to clinic in 1 year for follow-up

## 2022-01-28 ENCOUNTER — Other Ambulatory Visit: Payer: Self-pay

## 2022-01-28 ENCOUNTER — Inpatient Hospital Stay: Payer: BC Managed Care – PPO | Attending: Hematology and Oncology | Admitting: Hematology and Oncology

## 2022-01-28 DIAGNOSIS — C50411 Malignant neoplasm of upper-outer quadrant of right female breast: Secondary | ICD-10-CM | POA: Insufficient documentation

## 2022-01-28 DIAGNOSIS — Z79811 Long term (current) use of aromatase inhibitors: Secondary | ICD-10-CM | POA: Insufficient documentation

## 2022-01-28 DIAGNOSIS — Z170 Estrogen receptor positive status [ER+]: Secondary | ICD-10-CM | POA: Insufficient documentation

## 2022-01-28 MED ORDER — LETROZOLE 2.5 MG PO TABS: 2.5000 mg | ORAL_TABLET | Freq: Every day | ORAL | 3 refills | Status: DC

## 2023-01-27 NOTE — Progress Notes (Incomplete)
Patient Care Team: Brien Few, MD as PCP - General (Obstetrics and Gynecology) [name], MD as Consulting Physician (General Surgery) Nicholas Lose, MD as Consulting Physician (Hematology and Oncology) Kyung Rudd, MD as Consulting Physician (Radiation Oncology) Delice Bison Charlestine Massed, NP as Nurse Practitioner (Hematology and Oncology)  DIAGNOSIS: No diagnosis found.  SUMMARY OF ONCOLOGIC HISTORY: Oncology History  Breast cancer of upper-outer quadrant of right female breast ([place])  11/24/2015 Mammogram   2 suspicious groups of calcifications right breast UOQ posteriorly 2.2 x 2.3 x 2.5 cm (by U/S measured 1.2 x 0.8 x 1.2 cm); anteriorly 1.1 x 1.2 x 0.8 cm (not seen by ultrasound)   11/27/2015 Initial Diagnosis   Right breast biopsy UOQ Posterior: IDC with DCIS with, LVI present, ER 100%, PR 20%, Ki-67 30%, HER-2 negative; ANTERIOR: Complex sclerosing lesions with calcifications   02/05/2016 Surgery   Rt. Mastectomy: IDC Grade 1, 3 cm, 1/11 LN positive,with IG DCIS 0.1 cm margin, Diffuse LVI, cancer at axill tail. T2N1 (Stage 2B); Left mastectomy: Fibrocystic ch, Mammaprint high risk     03/15/2016 - 07/26/2016 Chemotherapy   Dose dense Adriamycin and Cytoxan 4 followed by Abraxane weekly 12   10/11/2016 - 12/09/2016 Radiation Therapy   Adj XRT at [facility]   01/10/2017 -  Anti-estrogen oral therapy   Letrozole 2.5 mg daily     CHIEF COMPLIANT: Follow-up of right breast cancer on letrozole therapy    INTERVAL HISTORY: Kiara Spencer is a 52 y.o. with above-mentioned history of right breast cancer treated with mastectomy, adjuvant chemotherapy, radiation, and who is currently on letrozole. She presents to the clinic today for follow-up.    ALLERGIES:  is allergic to cephalexin, ciprofloxacin, and propofol.  MEDICATIONS:  Current Outpatient Medications  Medication Sig Dispense Refill   aspirin-acetaminophen-caffeine (EXCEDRIN MIGRAINE) 250-250-65 MG tablet Take 2  tablets by mouth every 6 (six) hours as needed for headache.     Biotin 10000 MCG TABS Take 10 mg by mouth daily.      Calcium Carb-Cholecalciferol (CALCIUM-VITAMIN D) 500-200 MG-UNIT tablet Take 1 tablet by mouth daily.     letrozole (FEMARA) 2.5 MG tablet Take 1 tablet (2.5 mg total) by mouth daily. 90 tablet 3   No current facility-administered medications for this visit.    PHYSICAL EXAMINATION: ECOG PERFORMANCE STATUS: {CHL ONC ECOG PS:[id number]}  There were no vitals filed for this visit. There were no vitals filed for this visit.  BREAST:*** No palpable masses or nodules in either right or left breasts. No palpable axillary supraclavicular or infraclavicular adenopathy no breast tenderness or nipple discharge. (exam performed in the presence of a chaperone)  LABORATORY DATA:  I have reviewed the data as listed    Latest Ref Rng & Units 09/24/2018   10:22 AM 01/10/2017    8:12 AM 07/26/2016    9:24 AM  CMP  Glucose 70 - 99 mg/dL 74  86  94   BUN 6 - 20 mg/dL 18  16.0  9.3   Creatinine 0.44 - 1.00 mg/dL 0.68  0.9  0.7   Sodium 135 - 145 mmol/L 140  144  143   Potassium 3.5 - 5.1 mmol/L 4.5  4.1  4.6   Chloride 98 - 111 mmol/L 105     CO2 22 - 32 mmol/L '29  28  25   '$ Calcium 8.9 - 10.3 mg/dL 9.7  10.3  9.4   Total Protein 6.5 - 8.1 g/dL 7.3  7.0  6.4   Total Bilirubin 0.3 -  1.2 mg/dL 0.5  0.40  0.30   Alkaline Phos 38 - 126 U/L 56  61  51   AST 15 - 41 U/L '20  22  22   '$ ALT 0 - 44 U/L '15  21  18     '$ Lab Results  Component Value Date   WBC 4.4 09/24/2018   HGB 13.0 09/24/2018   HCT 41.0 09/24/2018   MCV 94.7 09/24/2018   PLT 251 09/24/2018   NEUTROABS 2.1 01/10/2017    ASSESSMENT & PLAN:  No problem-specific Assessment & Plan notes found for this encounter.    No orders of the defined types were placed in this encounter.  The patient has a good understanding of the overall plan. she agrees with it. she will call with any problems that may develop before the next  visit here. Total time spent: 30 mins including face to face time and time spent for planning, charting and co-ordination of care   Suzzette Righter, [place] 01/27/23    I Gardiner Coins am acting as a Education administrator for [employer]  ***

## 2023-01-31 ENCOUNTER — Inpatient Hospital Stay: Payer: BC Managed Care – PPO | Attending: Hematology and Oncology | Admitting: Hematology and Oncology

## 2023-01-31 NOTE — Assessment & Plan Note (Deleted)
Rt. 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 T2N1 (Stage 2B); Left mastectomy: Fibrocystic ch. Mammaprint high risk   Treatment summary: 1. No indication for repeat resection since there is nothing further to move in the posterior side 2. Mammaprint: High risk, adjuvant chemotherapy with dose dense Adriamycin Cytoxan 4 followed by weekly Abraxane 12; 03/15/2016-  07/26/2016 3. Followed by radiation therapy (10/11/2016- 12/09/2016) 4. Followed by antiestrogen therapy: Letrozole 2.5 mg daily  started 01/10/2017 --------------------------------------------------------------------------------------------------------------------------------------------- Letrozole toxicities:  1. Intermittent hot flashes  2.  Muscle aches and pains resolved by taking calcium Plans to continue for 7 years.    Breast cancer surveillance: No role of imaging studies because she had bilateral mastectomies. Breast exam 01/31/2023: Benign   Return to clinic in 1 year for follow-up

## 2023-03-05 ENCOUNTER — Other Ambulatory Visit: Payer: Self-pay | Admitting: Hematology and Oncology

## 2023-03-07 ENCOUNTER — Telehealth: Payer: Self-pay

## 2023-03-07 NOTE — Telephone Encounter (Signed)
Called Pt regarding letrozole refill request. Pt missed 01/31/23 appt and has not been seen by MD in over a year. Pt advised that a scheduler will be in touch to reschedule MD appt and that a one month supply of rx will be refilled until after MD appt. Pt verbalized understanding.

## 2023-03-09 ENCOUNTER — Telehealth: Payer: Self-pay | Admitting: Hematology and Oncology

## 2023-03-09 NOTE — Telephone Encounter (Signed)
Scheduled appointment per staff message. Left voicemail. 

## 2023-03-25 NOTE — Progress Notes (Signed)
Patient Care Team: Kiara Mackie, MD as PCP - General (Obstetrics and Gynecology) Kiara Kelp, MD as Consulting Physician (General Surgery) Kiara Croissant, MD as Consulting Physician (Hematology and Oncology) Kiara Puffer, MD as Consulting Physician (Radiation Oncology) Kiara Filler Larna Daughters, NP as Nurse Practitioner (Hematology and Oncology)  DIAGNOSIS: No diagnosis found.  SUMMARY OF ONCOLOGIC HISTORY: Oncology History  Breast cancer of upper-outer quadrant of right female breast  11/24/2015 Mammogram   2 suspicious groups of calcifications right breast UOQ posteriorly 2.2 x 2.3 x 2.5 cm (by U/S measured 1.2 x 0.8 x 1.2 cm); anteriorly 1.1 x 1.2 x 0.8 cm (not seen by ultrasound)   11/27/2015 Initial Diagnosis   Right breast biopsy UOQ Posterior: IDC with DCIS with, LVI present, ER 100%, PR 20%, Ki-67 30%, HER-2 negative; ANTERIOR: Complex sclerosing lesions with calcifications   02/05/2016 Surgery   Rt. Mastectomy: IDC Grade 1, 3 cm, 1/11 LN positive,with IG DCIS 0.1 cm margin, Diffuse LVI, cancer at axill tail. T2N1 (Stage 2B); Left mastectomy: Fibrocystic ch, Mammaprint high risk     03/15/2016 - 07/26/2016 Chemotherapy   Dose dense Adriamycin and Cytoxan 4 followed by Abraxane weekly 12   10/11/2016 - 12/09/2016 Radiation Therapy   Adj [facility] at [facility]   01/10/2017 -  Anti-estrogen oral therapy   Letrozole 2.5 mg daily     CHIEF COMPLIANT: Follow-up of right breast cancer on letrozole therapy    INTERVAL HISTORY: Kiara Spencer is a 52 y.o. with above-mentioned history of right breast cancer treated with mastectomy, adjuvant chemotherapy, radiation, and who is currently on letrozole. She presents to the clinic today for follow-up.    ALLERGIES:  is allergic to cephalexin, ciprofloxacin, and propofol.  MEDICATIONS:  Current Outpatient Medications  Medication Sig Dispense Refill   aspirin-acetaminophen-caffeine (EXCEDRIN MIGRAINE) 250-250-65 MG tablet Take 2  tablets by mouth every 6 (six) hours as needed for headache.     Biotin 16109 MCG TABS Take 10 mg by mouth daily.      Calcium Carb-Cholecalciferol (CALCIUM-VITAMIN D) 500-200 MG-UNIT tablet Take 1 tablet by mouth daily.     letrozole (FEMARA) 2.5 MG tablet TAKE 1 TABLET BY MOUTH EVERY DAY 30 tablet 0   No current facility-administered medications for this visit.    PHYSICAL EXAMINATION: ECOG PERFORMANCE STATUS: {CHL ONC ECOG PS:[id number]}  There were no vitals filed for this visit. There were no vitals filed for this visit.  BREAST:*** No palpable masses or nodules in either right or left breasts. No palpable axillary supraclavicular or infraclavicular adenopathy no breast tenderness or nipple discharge. (exam performed in the presence of a chaperone)  LABORATORY DATA:  I have reviewed the data as listed    Latest Ref Rng & Units 09/24/2018   10:22 AM 01/10/2017    8:12 AM 07/26/2016    9:24 AM  CMP  Glucose 70 - 99 mg/dL 74  86  94   BUN 6 - 20 mg/dL 18  60.4  9.3   Creatinine 0.44 - 1.00 mg/dL 5.40  0.9  0.7   Sodium 135 - 145 mmol/L 140  144  143   Potassium 3.5 - 5.1 mmol/L 4.5  4.1  4.6   Chloride 98 - 111 mmol/L 105     CO2 22 - 32 mmol/L Calcium 8.9 - 10.3 mg/dL 9.7  98.1  9.4   Total Protein 6.5 - 8.1 g/dL 7.3  7.0  6.4   Total Bilirubin 0.3 - 1.2 mg/dL  0.5  0.40  0.30   Alkaline Phos 38 - 126 U/L 56  61  51   AST 15 - 41 U/L ALT 0 - 44 U/L Lab Results  Component Value Date   WBC 4.4 09/24/2018   HGB 13.0 09/24/2018   HCT 41.0 09/24/2018   MCV 94.7 09/24/2018   PLT 251 09/24/2018   NEUTROABS 2.1 01/10/2017    ASSESSMENT & PLAN:  No problem-specific Assessment & Plan notes found for this encounter.    No orders of the defined types were placed in this encounter.  The patient has a good understanding of the overall plan. she agrees with it. she will call with any problems that may develop before the next visit  here. Total time spent: 30 mins including face to face time and time spent for planning, charting and co-ordination of care   Sherlyn Lick, CMA 03/25/23    I Janan Ridge am acting as a Neurosurgeon for [employer]  ***

## 2023-03-29 ENCOUNTER — Inpatient Hospital Stay: Payer: BC Managed Care – PPO | Attending: Hematology and Oncology | Admitting: Hematology and Oncology

## 2023-03-29 VITALS — BP 119/88 | HR 90 | Temp 97.7°F | Resp 18 | Ht 61.0 in | Wt 147.1 lb

## 2023-03-29 DIAGNOSIS — Z9013 Acquired absence of bilateral breasts and nipples: Secondary | ICD-10-CM | POA: Insufficient documentation

## 2023-03-29 DIAGNOSIS — Z923 Personal history of irradiation: Secondary | ICD-10-CM | POA: Insufficient documentation

## 2023-03-29 DIAGNOSIS — N951 Menopausal and female climacteric states: Secondary | ICD-10-CM | POA: Diagnosis not present

## 2023-03-29 DIAGNOSIS — Z170 Estrogen receptor positive status [ER+]: Secondary | ICD-10-CM | POA: Insufficient documentation

## 2023-03-29 DIAGNOSIS — Z9221 Personal history of antineoplastic chemotherapy: Secondary | ICD-10-CM | POA: Diagnosis not present

## 2023-03-29 DIAGNOSIS — C50411 Malignant neoplasm of upper-outer quadrant of right female breast: Secondary | ICD-10-CM | POA: Diagnosis present

## 2023-03-29 DIAGNOSIS — M791 Myalgia, unspecified site: Secondary | ICD-10-CM | POA: Diagnosis not present

## 2023-03-29 DIAGNOSIS — Z780 Asymptomatic menopausal state: Secondary | ICD-10-CM | POA: Diagnosis not present

## 2023-03-29 DIAGNOSIS — M109 Gout, unspecified: Secondary | ICD-10-CM | POA: Diagnosis not present

## 2023-03-29 MED ORDER — LETROZOLE 2.5 MG PO TABS: 2.5000 mg | ORAL_TABLET | Freq: Every day | ORAL | 3 refills | Status: DC

## 2023-03-29 NOTE — Assessment & Plan Note (Signed)
Rt. 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 T2N1 (Stage 2B); Left mastectomy: Fibrocystic ch. Mammaprint high risk   Treatment summary: 1. No indication for repeat resection since there is nothing further to move in the posterior side 2. Mammaprint: High risk, adjuvant chemotherapy with dose dense Adriamycin Cytoxan 4 followed by weekly Abraxane 12; 03/15/2016-  07/26/2016 3. Followed by radiation therapy (10/11/2016- 12/09/2016) 4. Followed by antiestrogen therapy: Letrozole 2.5 mg daily started 01/10/2017 --------------------------------------------------------------------------------------------------------------------------------------------- Letrozole toxicities:  1. Intermittent hot flashes 2.  Muscle aches and pains resolved by taking calcium Plans to continue for 7 years.  (1 more year left)   Breast cancer surveillance: No role of imaging studies because she had bilateral mastectomies. Breast exam 03/29/2023: Benign   Return to clinic in 1 year for follow-up

## 2023-05-11 ENCOUNTER — Ambulatory Visit (HOSPITAL_BASED_OUTPATIENT_CLINIC_OR_DEPARTMENT_OTHER)
Admission: RE | Admit: 2023-05-11 | Discharge: 2023-05-11 | Disposition: A | Discharge status: Home / Self Care | Payer: BC Managed Care – PPO | Source: Ambulatory Visit | Attending: Hematology and Oncology | Admitting: Hematology and Oncology

## 2023-05-11 DIAGNOSIS — Z780 Asymptomatic menopausal state: Secondary | ICD-10-CM

## 2023-05-11 DIAGNOSIS — Z170 Estrogen receptor positive status [ER+]: Secondary | ICD-10-CM

## 2023-05-11 DIAGNOSIS — C50411 Malignant neoplasm of upper-outer quadrant of right female breast: Secondary | ICD-10-CM | POA: Diagnosis present

## 2024-03-29 ENCOUNTER — Inpatient Hospital Stay: Payer: BC Managed Care – PPO | Attending: Hematology and Oncology | Admitting: Hematology and Oncology

## 2024-04-09 ENCOUNTER — Inpatient Hospital Stay: Attending: Hematology and Oncology | Admitting: Hematology and Oncology

## 2024-04-09 VITALS — BP 119/82 | HR 76 | Temp 98.4°F | Resp 18 | Wt 142.1 lb

## 2024-04-09 DIAGNOSIS — Z170 Estrogen receptor positive status [ER+]: Secondary | ICD-10-CM | POA: Diagnosis not present

## 2024-04-09 DIAGNOSIS — Z79811 Long term (current) use of aromatase inhibitors: Secondary | ICD-10-CM | POA: Diagnosis not present

## 2024-04-09 DIAGNOSIS — Z923 Personal history of irradiation: Secondary | ICD-10-CM | POA: Insufficient documentation

## 2024-04-09 DIAGNOSIS — C50411 Malignant neoplasm of upper-outer quadrant of right female breast: Secondary | ICD-10-CM | POA: Diagnosis present

## 2024-04-09 DIAGNOSIS — Z9221 Personal history of antineoplastic chemotherapy: Secondary | ICD-10-CM | POA: Diagnosis not present

## 2024-04-09 DIAGNOSIS — M8580 Other specified disorders of bone density and structure, unspecified site: Secondary | ICD-10-CM | POA: Diagnosis not present

## 2024-04-09 DIAGNOSIS — Z9011 Acquired absence of right breast and nipple: Secondary | ICD-10-CM | POA: Insufficient documentation

## 2024-04-09 NOTE — Progress Notes (Signed)
 Patient Care Team: Meriam Stamp, MD as PCP - General (Obstetrics and Gynecology) Boyce Byes, MD as Consulting Physician (General Surgery) Cameron Cea, MD as Consulting Physician (Hematology and Oncology) Johna Myers, MD as Consulting Physician (Radiation Oncology) Debbie Fails Laura Polio, NP as Nurse Practitioner (Hematology and Oncology)  DIAGNOSIS:  Encounter Diagnosis  Name Primary?   Malignant neoplasm of upper-outer quadrant of right breast in female, estrogen receptor positive ([facility]) Yes    SUMMARY OF ONCOLOGIC HISTORY: Oncology History  Breast cancer of upper-outer quadrant of right female breast ([facility])  11/24/2015 Mammogram   2 suspicious groups of calcifications right breast UOQ posteriorly 2.2 x 2.3 x 2.5 cm (by U/S measured 1.2 x 0.8 x 1.2 cm); anteriorly 1.1 x 1.2 x 0.8 cm (not seen by ultrasound)   11/27/2015 Initial Diagnosis   Right breast biopsy UOQ Posterior: IDC with DCIS with, LVI present, ER 100%, PR 20%, Ki-67 30%, HER-2 negative; ANTERIOR: Complex sclerosing lesions with calcifications   02/05/2016 Surgery   Rt. Mastectomy: IDC Grade 1, 3 cm, 1/11 LN positive,with IG DCIS 0.1 cm margin, Diffuse LVI, cancer at axill tail. T2N1 (Stage 2B); Left mastectomy: Fibrocystic ch, Mammaprint high risk     03/15/2016 - 07/26/2016 Chemotherapy   Dose dense Adriamycin  and Cytoxan  4 followed by Abraxane  weekly 12   10/11/2016 - 12/09/2016 Radiation Therapy   Adj XRT at [facility]   01/10/2017 -  Anti-estrogen oral therapy   Letrozole  2.5 mg daily     CHIEF COMPLIANT: Follow-up on letrozole  therapy  HISTORY OF PRESENT ILLNESS:   History of Present Illness Kiara Spencer is a 53 year old female with breast cancer who presents for completion of her 7-year treatment course.  She completed chemotherapy on July 26, 2016, and radiation therapy by December 09, 2016. She began a medication regimen in February 2018, extending three months beyond the planned 7-year  course.  She experiences joint aches, stiffness, and constipation as side effects of the medication. Concerns about bone health have arisen due to osteopenia diagnosed after a fall. X-rays and evaluations indicate she is in the pre-stage of osteoporosis. She takes calcium and vitamin D supplements to address bone loss.  Her medical history includes a bilateral mastectomy. Bone density is near normal with a score of minus one point, indicating no significant osteoporosis. She is not currently taking any prescription medications other than supplements.     ALLERGIES:  is allergic to cephalexin, ciprofloxacin , and propofol .  MEDICATIONS:  Current Outpatient Medications  Medication Sig Dispense Refill   aspirin-acetaminophen -caffeine (EXCEDRIN MIGRAINE) 250-250-65 MG tablet Take 2 tablets by mouth every 6 (six) hours as needed for headache.     Biotin 65784 MCG TABS Take 10 mg by mouth daily.      Calcium Carb-Cholecalciferol (CALCIUM-VITAMIN D) 500-200 MG-UNIT tablet Take 1 tablet by mouth daily.     colchicine 0.6 MG tablet Take first tablet and then 2nd tablet an hour later     No current facility-administered medications for this visit.    PHYSICAL EXAMINATION: ECOG PERFORMANCE STATUS: 1 - Symptomatic but completely ambulatory  Vitals:   04/09/24 0846  BP: 119/82  Pulse: 76  Resp: 18  Temp: 98.4 F (36.9 C)  SpO2: 100%   Filed Weights   04/09/24 0846  Weight: 142 lb 1.6 oz (64.5 kg)      LABORATORY DATA:  I have reviewed the data as listed    Latest Ref Rng & Units 09/24/2018   10:22 AM 01/10/2017  8:12 AM 07/26/2016    9:24 AM  CMP  Glucose 70 - 99 mg/dL 74  86  94   BUN 6 - 20 mg/dL 18  84.6  9.3   Creatinine 0.44 - 1.00 mg/dL 9.62  0.9  0.7   Sodium 135 - 145 mmol/L 140  144  143   Potassium 3.5 - 5.1 mmol/L 4.5  4.1  4.6   Chloride 98 - 111 mmol/L 105     CO2 22 - 32 mmol/L 29  28  25    Calcium 8.9 - 10.3 mg/dL 9.7  95.2  9.4   Total Protein 6.5 - 8.1 g/dL  7.3  7.0  6.4   Total Bilirubin 0.3 - 1.2 mg/dL 0.5  8.41  3.24   Alkaline Phos 38 - 126 U/L 56  61  51   AST 15 - 41 U/L 20  22  22    ALT 0 - 44 U/L 15  21  18      Lab Results  Component Value Date   WBC 4.4 09/24/2018   HGB 13.0 09/24/2018   HCT 41.0 09/24/2018   MCV 94.7 09/24/2018   PLT 251 09/24/2018   NEUTROABS 2.1 01/10/2017    ASSESSMENT & PLAN:  Breast cancer of upper-outer quadrant of right female breast ([facility]) Rt. 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 T2N1 (Stage 2B); Left mastectomy: Fibrocystic ch. Mammaprint high risk   Treatment summary: 1. No indication for repeat resection since there is nothing further to move in the posterior side 2. Mammaprint: High risk, adjuvant chemotherapy with dose dense Adriamycin  Cytoxan  4 followed by weekly Abraxane  12; 03/15/2016-  07/26/2016 3. Followed by radiation therapy (10/11/2016- 12/09/2016) 4. Followed by antiestrogen therapy: Letrozole  2.5 mg daily started 01/10/2017 --------------------------------------------------------------------------------------------------------------------------------------------- Letrozole  toxicities:  1. Intermittent hot flashes 2.  Muscle aches and pains resolved by taking calcium Completed 7 years of therapy.  We discussed discontinuing antiestrogen therapy at this time.   Breast cancer surveillance: No role of imaging studies because she had bilateral mastectomies. 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  Her daughter is contemplating on options for college.   Return to clinic in 1 year for follow-up ------------------------------------- Assessment and Plan Assessment & Plan Malignant neoplasm of breast Completed seven years of letrozole  therapy for estrogen receptor-positive breast cancer. Continuing therapy beyond seven years offers no benefit and  poses risks such as bone loss and joint stiffness. The Gardent blood test can detect breast cancer recurrence earlier than imaging, facilitating early intervention. It is covered by most insurances, and if not, the company will not charge. - Discontinue letrozole  therapy. - Schedule Gardent blood test every six months to monitor for recurrence. - Verify insurance coverage for [employer]; if not covered, the company will not charge. - Continue calcium and vitamin D supplementation for bone health.  Osteopenia Diagnosed with mild osteopenia, likely exacerbated by long-term letrozole  use. Bone density is near normal with a T-score of -1.1. Discontinuation of letrozole  may stabilize bone density, and lifestyle modifications can improve bone health. - Continue calcium and vitamin D supplementation. - Encourage weight-bearing exercises such as walking to improve bone density.  Return to clinic on an as-needed basis    No orders of the defined types were placed in this encounter.  The patient has a good understanding of the overall plan. she agrees with it. she will  call with any problems that may develop before the next visit here. Total time spent: 30 mins including face to face time and time spent for planning, charting and co-ordination of care   Viinay K Lafe Clerk, MD 04/09/24

## 2024-04-09 NOTE — Assessment & Plan Note (Signed)
 Rt. 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 T2N1 (Stage 2B); Left mastectomy: Fibrocystic ch. Mammaprint high risk   Treatment summary: 1. No indication for repeat resection since there is nothing further to move in the posterior side 2. Mammaprint: High risk, adjuvant chemotherapy with dose dense Adriamycin  Cytoxan  4 followed by weekly Abraxane  12; 03/15/2016-  07/26/2016 3. Followed by radiation therapy (10/11/2016- 12/09/2016) 4. Followed by antiestrogen therapy: Letrozole  2.5 mg daily started 01/10/2017 --------------------------------------------------------------------------------------------------------------------------------------------- Letrozole  toxicities:  1. Intermittent hot flashes 2.  Muscle aches and pains resolved by taking calcium Completed 7 years of therapy.  We discussed discontinuing antiestrogen therapy at this time.   Breast cancer surveillance: No role of imaging studies because she had bilateral mastectomies. 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   Return to clinic in 1 year for follow-up

## 2024-04-12 ENCOUNTER — Telehealth: Payer: Self-pay

## 2024-04-12 NOTE — Telephone Encounter (Signed)
 Per md orders entered for [employer] and all supported documents faxed to [phone]. Faxed confirmation was received.

## 2024-04-15 ENCOUNTER — Other Ambulatory Visit: Payer: Self-pay | Admitting: Hematology and Oncology
